# Patient Record
Sex: Male | Born: 1975
Health system: Southern US, Community
[De-identification: ages and names within clinical notes are randomized; demographics above are authoritative.]

## PROBLEM LIST (undated history)

## (undated) DIAGNOSIS — Z9289 Personal history of other medical treatment: Secondary | ICD-10-CM

## (undated) DIAGNOSIS — F1911 Other psychoactive substance abuse, in remission: Secondary | ICD-10-CM

## (undated) DIAGNOSIS — B20 Human immunodeficiency virus [HIV] disease: Secondary | ICD-10-CM

## (undated) DIAGNOSIS — I7300 Raynaud's syndrome without gangrene: Secondary | ICD-10-CM

## (undated) DIAGNOSIS — R569 Unspecified convulsions: Secondary | ICD-10-CM

## (undated) DIAGNOSIS — K900 Celiac disease: Secondary | ICD-10-CM

## (undated) DIAGNOSIS — Z21 Asymptomatic human immunodeficiency virus [HIV] infection status: Secondary | ICD-10-CM

## (undated) DIAGNOSIS — T7840XA Allergy, unspecified, initial encounter: Secondary | ICD-10-CM

## (undated) DIAGNOSIS — F419 Anxiety disorder, unspecified: Secondary | ICD-10-CM

## (undated) HISTORY — DX: Asymptomatic human immunodeficiency virus (hiv) infection status: Z21

## (undated) HISTORY — DX: Celiac disease: K90.0

## (undated) HISTORY — DX: Human immunodeficiency virus (HIV) disease: B20

## (undated) HISTORY — DX: Unspecified convulsions: R56.9

## (undated) HISTORY — DX: Other psychoactive substance abuse, in remission: F19.11

## (undated) HISTORY — DX: Personal history of other medical treatment: Z92.89

## (undated) HISTORY — DX: Anxiety disorder, unspecified: F41.9

## (undated) HISTORY — DX: Raynaud's syndrome without gangrene: I73.00

## (undated) HISTORY — PX: WISDOM TOOTH EXTRACTION: SHX21

## (undated) HISTORY — DX: Allergy, unspecified, initial encounter: T78.40XA

---

## 2013-12-25 DIAGNOSIS — Z8719 Personal history of other diseases of the digestive system: Secondary | ICD-10-CM | POA: Insufficient documentation

## 2013-12-25 DIAGNOSIS — Z87898 Personal history of other specified conditions: Secondary | ICD-10-CM | POA: Insufficient documentation

## 2013-12-25 DIAGNOSIS — R768 Other specified abnormal immunological findings in serum: Secondary | ICD-10-CM | POA: Insufficient documentation

## 2015-02-13 ENCOUNTER — Telehealth: Payer: Self-pay

## 2015-02-13 NOTE — Telephone Encounter (Signed)
Patient was previously in care for his HIV at [facility].  Dr Jeanie Cooks is his primary care physician.   He was diagnosed 2005 and currently on Triumeq.  When patient arrives for visit he will need to sign a release for [facility].   Laverle Patter, RN

## 2015-02-26 ENCOUNTER — Encounter: Payer: Self-pay | Admitting: Internal Medicine

## 2015-02-26 ENCOUNTER — Ambulatory Visit (INDEPENDENT_AMBULATORY_CARE_PROVIDER_SITE_OTHER): Payer: 59 | Admitting: Internal Medicine

## 2015-02-26 VITALS — BP 149/89 | HR 59 | Temp 97.9°F | Ht 74.0 in | Wt 153.8 lb

## 2015-02-26 DIAGNOSIS — Z23 Encounter for immunization: Secondary | ICD-10-CM | POA: Diagnosis not present

## 2015-02-26 DIAGNOSIS — Z113 Encounter for screening for infections with a predominantly sexual mode of transmission: Secondary | ICD-10-CM | POA: Diagnosis not present

## 2015-02-26 DIAGNOSIS — B20 Human immunodeficiency virus [HIV] disease: Secondary | ICD-10-CM | POA: Diagnosis not present

## 2015-02-26 DIAGNOSIS — Z79899 Other long term (current) drug therapy: Secondary | ICD-10-CM | POA: Insufficient documentation

## 2015-02-26 DIAGNOSIS — G40909 Epilepsy, unspecified, not intractable, without status epilepticus: Secondary | ICD-10-CM | POA: Insufficient documentation

## 2015-02-26 DIAGNOSIS — K900 Celiac disease: Secondary | ICD-10-CM | POA: Insufficient documentation

## 2015-02-26 MED ORDER — TRIUMEQ 600-50-300 MG PO TABS: 1.0000 | ORAL_TABLET | Freq: Every day | ORAL | Status: DC

## 2015-02-26 NOTE — Progress Notes (Deleted)
Patient ID: John Robbins, male    DOB: 09/05/1975, 39 y.o.   MRN: [id number]  Reason for visit: to establish care as a new patient with HIV  HPI:   Patient was first diagnosed in 2003 with a CD4 of over 500.  He was tested as part screening test.  The CD4 count was 574 at [facility] in September 2015, viral load undetectable.  There have been no associated symptoms.  He was initially started on Isentress and Truvada then changed to Triumeq when he started at [facility] May 2015.    No past medical history on file.  Prior to Admission medications   Not on File    Allergies  Allergen Reactions  . Gluten Meal Other (See Comments)    Neurological    Social History  Substance Use Topics  . Smoking status: Never Smoker   . Smokeless tobacco: Never Used  . Alcohol Use: 0.0 oz/week    0 Standard drinks or equivalent per week    No family history on file.  ***  Review of Systems {ros - complete:22885} All other systems reviewed and are negative   CONSTITUTIONAL:{EXAM; GENERAL APPEARANCE:5021}  Filed Vitals:   02/26/15 1359  BP: 149/89  Pulse: 59  Temp: 97.9 F (36.6 C)   EYES: anicteric HENT: no thrush CARD:{Mis exam cardio:32073} RESP:{Exam; lungs brief:12271} GI:{Exam; abdomen:5794::"Bowel sounds are normal","liver is not enlarged","spleen is not enlarged"} MS:{extremities:315109::"peripheral pulses normal, no pedal edema, no clubbing or cyanosis"} SKIN:*** NEURO: ***  No results found for: HIV1RNAQUANT No components found for: HIV1GENOTYPRPLUS No components found for: THELPERCELL  Assessment: new patient here with HIV.  Discussed with patient treatment options and side effects, benefits of treatment, long term outcomes.  I discussed the severity of untreated HIV including higher cancer risk, opportunistic infections, renal failure.  Also discussed needing to use condoms, partner disclosure, necessary vaccines, blood monitoring.  All questions answered.    Plan: 1)

## 2015-02-26 NOTE — Progress Notes (Signed)
Patient ID: John Robbins, male   DOB: 02-Jan-1976, 39 y.o.   MRN: [id number]  Patient ID: John Robbins, male    DOB: [birth year], 39 y.o.   MRN: [id number]  Reason for visit: to establish care as a new patient with HIV  HPI:   Patient was first diagnosedin about 2003 during episode of pancreatitis thought to be due to [place].  He was tested as part hospitalization.  Previously tested never before that.  The CD4 count is 618 from [facility], viral load undetectable.  There have been no associated symptoms.  He was previously on Isentress and Truvada starting in 2013 for his first regimen and then switched to Triumeq in 2015 at [facility].  He takes daily, no missed doses, uninterrupted.  Does note some burning when taking but not too bad.  No weight loss.  Take Valtrex as needed for pain/sensitivity on side that felt like shingles.  No issues or questions.   had anal swab at [facility] c/w ASCUS.   Past Medical History  Diagnosis Date  . HIV infection ([place])   . Seizures ([place])   . Celiac disease     Prior to Admission medications   Medication Sig Start Date End Date Taking? Authorizing Provider  TRIUMEQ 600-50-300 MG TABS Take 1 tablet by mouth daily. 02/26/15  Yes Thayer Headings, MD  valACYclovir (VALTREX) 500 MG tablet Take 500 mg by mouth 2 (two) times daily.   Yes Historical Provider, MD    Allergies  Allergen Reactions  . Gluten Meal Other (See Comments)    Neurological    Social History  Substance Use Topics  . Smoking status: Never Smoker   . Smokeless tobacco: Never Used  . Alcohol Use: 0.0 oz/week    0 Standard drinks or equivalent per week    Family History  Problem Relation Age of Onset  . Heart disease Father   . Hypertension Father   . Heart disease Maternal Grandfather     Review of Systems Constitutional: negative for fevers, fatigue and malaise Respiratory: negative for cough Gastrointestinal: negative for diarrhea All other systems reviewed and are negative    CONSTITUTIONAL:in no apparent distress and alert  Filed Vitals:   02/26/15 1359  BP: 149/89  Pulse: 59  Temp: 97.9 F (36.6 C)   EYES: anicteric HENT: no thrush CARD:Cor RRR and No murmurs RESP:clear to auscultation, normal respiratory effort. BI:PJRPZ sounds are normal, spleen is not enlarged MS:no joint swelling SKIN:no rashes NEURO: non-focal GU: no anogenital lesions noted  Labs from PCP office, dated 01/06/2015: CD 4 618, viral load undetectable, WBC 9, Hgb 15.9, creat 0.85  Assessment: new patient here with well-controlled HIV.  Discussed with patient other treatment options and side effects, benefits of treatment, long term outcomes.  I discussed the severity of untreated HIV including higher cancer risk, opportunistic infections, renal failure.  Also discussed needing to use condoms, partner disclosure, necessary vaccines, blood monitoring.  All questions answered.    Plan: 1) continue with Triumeq, refills sent 2) schedule with [facility] clinic with history of ASCUS  3) rtc 3 months with labs before 4) My Chart info given

## 2015-04-01 ENCOUNTER — Encounter: Payer: Self-pay | Admitting: *Deleted

## 2015-05-19 ENCOUNTER — Other Ambulatory Visit: Payer: 59

## 2015-05-19 DIAGNOSIS — B20 Human immunodeficiency virus [HIV] disease: Secondary | ICD-10-CM

## 2015-05-19 DIAGNOSIS — Z113 Encounter for screening for infections with a predominantly sexual mode of transmission: Secondary | ICD-10-CM

## 2015-05-19 NOTE — Addendum Note (Signed)
Addended by: Dolan Amen D on: 05/19/2015 11:46 AM   Modules accepted: Orders

## 2015-05-20 LAB — T-HELPER CELL (CD4) - (RCID CLINIC ONLY)
CD4 T CELL HELPER: 24 % — AB (ref 33–55)
CD4 T Cell Abs: 460 /uL (ref 400–2700)

## 2015-05-20 LAB — URINE CYTOLOGY ANCILLARY ONLY
CHLAMYDIA, DNA PROBE: NEGATIVE
NEISSERIA GONORRHEA: NEGATIVE

## 2015-05-20 LAB — RPR

## 2015-05-20 LAB — HIV-1 RNA QUANT-NO REFLEX-BLD
HIV 1 RNA QUANT: 135 {copies}/mL — AB (ref <20)
HIV-1 RNA Quant, Log: 2.13 Log copies/mL — ABNORMAL HIGH (ref <1.30)

## 2015-06-02 ENCOUNTER — Encounter: Payer: Self-pay | Admitting: Internal Medicine

## 2015-06-02 ENCOUNTER — Ambulatory Visit (INDEPENDENT_AMBULATORY_CARE_PROVIDER_SITE_OTHER): Payer: 59 | Admitting: Internal Medicine

## 2015-06-02 VITALS — BP 144/97 | HR 80 | Temp 98.2°F | Ht 74.0 in | Wt 163.0 lb

## 2015-06-02 DIAGNOSIS — Z23 Encounter for immunization: Secondary | ICD-10-CM

## 2015-06-02 DIAGNOSIS — Z79899 Other long term (current) drug therapy: Secondary | ICD-10-CM | POA: Diagnosis not present

## 2015-06-02 DIAGNOSIS — B20 Human immunodeficiency virus [HIV] disease: Secondary | ICD-10-CM | POA: Diagnosis not present

## 2015-06-02 DIAGNOSIS — K900 Celiac disease: Secondary | ICD-10-CM

## 2015-06-02 DIAGNOSIS — Z113 Encounter for screening for infections with a predominantly sexual mode of transmission: Secondary | ICD-10-CM | POA: Diagnosis not present

## 2015-06-02 NOTE — Assessment & Plan Note (Signed)
Some episodes of intolerance.  Encouraged to try and take medicine regardless.

## 2015-06-02 NOTE — Assessment & Plan Note (Signed)
I will check his viral load again to be sure is still suppressed or nearly so.  If ok, rtc 3-4 months.

## 2015-06-02 NOTE — Progress Notes (Signed)
CC: Follow up for HIV  Interval history: Currently is asymptomatic and nearly suppressed on Triumeq.  Since last visit he has had no new issues.   Has no associated side effects.  He thinks he may have missed some doses with intestinal upset from celiac disease.  He is unsure if it was before his labs.  His CD4 is 460 but viral load is 135.       A  Prior to Admission medications   Medication Sig Start Date End Date Taking? Authorizing Provider  TRIUMEQ 600-50-300 MG TABS Take 1 tablet by mouth daily. 02/26/15  Yes Thayer Headings, MD  valACYclovir (VALTREX) 500 MG tablet Take 500 mg by mouth 2 (two) times daily.   Yes Historical Provider, MD    Review of Systems Constitutional: negative for fatigue Gastrointestinal: negative for diarrhea and constipation All other systems reviewed and are negative   Physical Exam: CONSTITUTIONAL:in no apparent distress  Filed Vitals:   06/02/15 0946  BP: 144/97  Pulse: 80  Temp: 98.2 F (36.8 C)   Eyes: anicteric HENT: no thrush, no cervical lymphadenopathy Respiratory: Normal respiratory effort; CTA B Skin: no rashes  Lab Results  Component Value Date   HIV1RNAQUANT 135* 05/19/2015   No components found for: HIV1GENOTYPRPLUS No components found for: THELPERCELL

## 2015-06-02 NOTE — Addendum Note (Signed)
Addended by: Landis Gandy on: 06/02/2015 11:04 AM   Modules accepted: Orders

## 2015-06-03 LAB — HIV-1 RNA QUANT-NO REFLEX-BLD

## 2015-06-04 ENCOUNTER — Encounter: Payer: Self-pay | Admitting: Internal Medicine

## 2015-06-04 ENCOUNTER — Encounter: Payer: Self-pay | Admitting: *Deleted

## 2015-06-04 NOTE — Telephone Encounter (Signed)
[name], could you help him? Thanks!  Sharyn Lull

## 2015-06-05 NOTE — Telephone Encounter (Signed)
Pt. Message:  John Robbins,  If MyChart allows you access to media files, there are three scanned documents dated 01/06/15 in your chart from your primary care physician. If this is not viewable in [place], you are welcome to come to the clinic and request a records release and we will print these three files.  [name]

## 2015-06-26 ENCOUNTER — Encounter: Payer: Self-pay | Admitting: Internal Medicine

## 2015-08-07 ENCOUNTER — Encounter: Payer: Self-pay | Admitting: Internal Medicine

## 2015-08-07 NOTE — Telephone Encounter (Signed)
patietn with questions regarding billing.  Will route to [facility]. Landis Gandy, RN

## 2015-08-11 ENCOUNTER — Encounter: Payer: Self-pay | Admitting: Internal Medicine

## 2015-08-13 ENCOUNTER — Telehealth: Payer: Self-pay | Admitting: *Deleted

## 2015-08-13 ENCOUNTER — Encounter: Payer: Self-pay | Admitting: *Deleted

## 2015-08-13 ENCOUNTER — Other Ambulatory Visit: Payer: Self-pay | Admitting: Internal Medicine

## 2015-08-13 DIAGNOSIS — B20 Human immunodeficiency virus [HIV] disease: Secondary | ICD-10-CM

## 2015-08-13 NOTE — Telephone Encounter (Signed)
Pt having a billing issue, needs to speak with Samara Snide.  Transferred pt to Samara Snide to address billing needs.

## 2015-08-17 ENCOUNTER — Other Ambulatory Visit: Payer: Self-pay | Admitting: Internal Medicine

## 2015-08-26 ENCOUNTER — Other Ambulatory Visit: Payer: Self-pay | Admitting: *Deleted

## 2015-08-26 DIAGNOSIS — B20 Human immunodeficiency virus [HIV] disease: Secondary | ICD-10-CM

## 2015-08-26 MED ORDER — ABACAVIR-DOLUTEGRAVIR-LAMIVUD 600-50-300 MG PO TABS: 1.0000 | ORAL_TABLET | Freq: Every day | ORAL | Status: DC

## 2015-09-22 ENCOUNTER — Other Ambulatory Visit: Payer: 59

## 2015-10-05 ENCOUNTER — Ambulatory Visit: Payer: 59 | Admitting: Internal Medicine

## 2015-11-30 ENCOUNTER — Other Ambulatory Visit: Payer: 59

## 2015-11-30 ENCOUNTER — Other Ambulatory Visit (HOSPITAL_COMMUNITY)
Admission: RE | Admit: 2015-11-30 | Discharge: 2015-11-30 | Disposition: A | Discharge status: Home / Self Care | Payer: 59 | Source: Ambulatory Visit | Attending: Internal Medicine | Admitting: Internal Medicine

## 2015-11-30 DIAGNOSIS — Z113 Encounter for screening for infections with a predominantly sexual mode of transmission: Secondary | ICD-10-CM | POA: Diagnosis not present

## 2015-11-30 DIAGNOSIS — Z79899 Other long term (current) drug therapy: Secondary | ICD-10-CM

## 2015-11-30 DIAGNOSIS — B20 Human immunodeficiency virus [HIV] disease: Secondary | ICD-10-CM

## 2015-11-30 LAB — CBC WITH DIFFERENTIAL/PLATELET
BASOS ABS: 0 {cells}/uL (ref 0–200)
Basophils Relative: 0 %
EOS PCT: 2 %
Eosinophils Absolute: 96 cells/uL (ref 15–500)
HEMATOCRIT: 48.2 % (ref 38.5–50.0)
HEMOGLOBIN: 16.5 g/dL (ref 13.2–17.1)
LYMPHS ABS: 2112 {cells}/uL (ref 850–3900)
Lymphocytes Relative: 44 %
MCH: 31.1 pg (ref 27.0–33.0)
MCHC: 34.2 g/dL (ref 32.0–36.0)
MCV: 90.8 fL (ref 80.0–100.0)
MONO ABS: 336 {cells}/uL (ref 200–950)
MPV: 11.6 fL (ref 7.5–12.5)
Monocytes Relative: 7 %
NEUTROS ABS: 2256 {cells}/uL (ref 1500–7800)
Neutrophils Relative %: 47 %
Platelets: 190 10*3/uL (ref 140–400)
RBC: 5.31 MIL/uL (ref 4.20–5.80)
RDW: 13.5 % (ref 11.0–15.0)
WBC: 4.8 10*3/uL (ref 3.8–10.8)

## 2015-11-30 LAB — COMPLETE METABOLIC PANEL WITH GFR
ALBUMIN: 4.3 g/dL (ref 3.6–5.1)
ALK PHOS: 43 U/L (ref 40–115)
ALT: 23 U/L (ref 9–46)
AST: 21 U/L (ref 10–40)
BUN: 17 mg/dL (ref 7–25)
CALCIUM: 9.1 mg/dL (ref 8.6–10.3)
CHLORIDE: 102 mmol/L (ref 98–110)
CO2: 29 mmol/L (ref 20–31)
Creat: 1.27 mg/dL (ref 0.60–1.35)
GFR, Est African American: 82 mL/min (ref >=60)
GFR, Est Non African American: 71 mL/min (ref >=60)
GLUCOSE: 119 mg/dL — AB (ref 65–99)
Potassium: 3.9 mmol/L (ref 3.5–5.3)
SODIUM: 139 mmol/L (ref 135–146)
TOTAL PROTEIN: 6.4 g/dL (ref 6.1–8.1)
Total Bilirubin: 0.8 mg/dL (ref 0.2–1.2)

## 2015-11-30 LAB — LIPID PANEL
CHOL/HDL RATIO: 3.2 ratio (ref <=5.0)
CHOLESTEROL: 183 mg/dL (ref 125–200)
HDL: 57 mg/dL (ref >=40)
LDL Cholesterol: 96 mg/dL (ref <130)
Triglycerides: 151 mg/dL — ABNORMAL HIGH (ref <150)
VLDL: 30 mg/dL (ref <30)

## 2015-12-01 LAB — T-HELPER CELL (CD4) - (RCID CLINIC ONLY)
CD4 % Helper T Cell: 25 % — ABNORMAL LOW (ref 33–55)
CD4 T Cell Abs: 540 /uL (ref 400–2700)

## 2015-12-01 LAB — RPR

## 2015-12-01 LAB — HIV-1 RNA QUANT-NO REFLEX-BLD
HIV 1 RNA Quant: <20 copies/mL (ref <20)
HIV-1 RNA Quant, Log: <1.3 Log copies/mL (ref <1.30)

## 2015-12-01 LAB — URINE CYTOLOGY ANCILLARY ONLY
Chlamydia: NEGATIVE
Neisseria Gonorrhea: NEGATIVE

## 2016-06-03 ENCOUNTER — Ambulatory Visit (HOSPITAL_COMMUNITY)
Admission: EM | Admit: 2016-06-03 | Discharge: 2016-06-03 | Disposition: A | Discharge status: Home / Self Care | Payer: 59 | Attending: Family Medicine | Admitting: Family Medicine

## 2016-06-03 ENCOUNTER — Encounter (HOSPITAL_COMMUNITY): Payer: Self-pay | Admitting: Emergency Medicine

## 2016-06-03 DIAGNOSIS — J40 Bronchitis, not specified as acute or chronic: Secondary | ICD-10-CM | POA: Diagnosis not present

## 2016-06-03 MED ORDER — PREDNISONE 20 MG PO TABS: 20.0000 mg | ORAL_TABLET | Freq: Two times a day (BID) | ORAL | 0 refills | Status: AC

## 2016-06-03 MED ORDER — ALBUTEROL SULFATE HFA 108 (90 BASE) MCG/ACT IN AERS: 1.0000 | INHALATION_SPRAY | Freq: Four times a day (QID) | RESPIRATORY_TRACT | 0 refills | Status: DC | PRN

## 2016-06-03 MED ORDER — AZITHROMYCIN 250 MG PO TABS: 250.0000 mg | ORAL_TABLET | Freq: Every day | ORAL | 0 refills | Status: DC

## 2016-06-03 MED ORDER — BENZONATATE 100 MG PO CAPS: 100.0000 mg | ORAL_CAPSULE | Freq: Three times a day (TID) | ORAL | 0 refills | Status: DC

## 2016-06-03 NOTE — Discharge Instructions (Signed)
I am treating you for bronchitis. I have prescribed Azithromycin. Take 2 tablets today, then 1 tablet daily till finished. I have also prescribed a steroid called prednisone. Take two tablets, twice a day for 6 days. For cough, I have prescribed a medication called Tessalon. Take 1 tablet every 8 hours as needed for your cough. I have also prescribed her rescue inhaler called albuterol. Take 1-2 puffs every 4-6 hours as needed for wheezing and shortness of breath. Should your symptoms fail to improve or worsen, follow up with your primary care provider, or return to clinic.

## 2016-06-03 NOTE — ED Provider Notes (Signed)
CSN: [id number]     Arrival date & time 06/03/16  1206 History   First MD Initiated Contact with Patient 06/03/16 1230     Chief Complaint  Patient presents with  . Cough   (Consider location/radiation/quality/duration/timing/severity/associated sxs/prior Treatment) 41 year old male presents to clinic with a 3 to four-week history of cough, weakness, shortness of breath, and some wheezing. States his symptoms initially started as being flulike, with fever, weakness, loss of appetite, and congestion. States those symptoms have since resolved, however his cough, and wheezing, has remained. He does not smoke, has had a prior history of exercise-induced asthma, denies COPD or emphysema. He has no nausea, vomiting, diarrhea, or abdominal pain. Denies other symptoms.   The history is provided by the patient.  Cough    Past Medical History:  Diagnosis Date  . Asthma   . Celiac disease   . HIV infection ([place])   . Seizures ([place])    History reviewed. No pertinent surgical history. Family History  Problem Relation Age of Onset  . Heart disease Father   . Hypertension Father   . Heart disease Maternal Grandfather    Social History  Substance Use Topics  . Smoking status: Never Smoker  . Smokeless tobacco: Never Used  . Alcohol use 0.0 oz/week    Review of Systems  Reason unable to perform ROS: As covered in history of present illness.  Respiratory: Positive for cough.   All other systems reviewed and are negative.   Allergies  Gluten meal  Home Medications   Prior to Admission medications   Medication Sig Start Date End Date Taking? Authorizing Provider  abacavir-dolutegravir-lamiVUDine (TRIUMEQ) 600-50-300 MG tablet Take 1 tablet by mouth daily. 08/26/15  Yes Thayer Headings, MD  albuterol (PROVENTIL HFA;VENTOLIN HFA) 108 (90 Base) MCG/ACT inhaler Inhale 1-2 puffs into the lungs every 6 (six) hours as needed for wheezing or shortness of breath. 06/03/16   Barnet Glasgow, NP   azithromycin (ZITHROMAX) 250 MG tablet Take 1 tablet (250 mg total) by mouth daily. Take first 2 tablets together, then 1 every day until finished. 06/03/16   Barnet Glasgow, NP  benzonatate (TESSALON) 100 MG capsule Take 1 capsule (100 mg total) by mouth every 8 (eight) hours. 06/03/16   Barnet Glasgow, NP  predniSONE (DELTASONE) 20 MG tablet Take 1 tablet (20 mg total) by mouth 2 (two) times daily with a meal. 06/03/16 06/09/16  Barnet Glasgow, NP  valACYclovir (VALTREX) 500 MG tablet Take 500 mg by mouth 2 (two) times daily.    Historical Provider, MD   Meds Ordered and Administered this Visit  Medications - No data to display  BP 134/96 (BP Location: Left Arm)   Pulse 70   Temp 97.5 F (36.4 C) (Oral)   SpO2 100%  No data found.   Physical Exam  Constitutional: He is oriented to person, place, and time. He appears well-developed and well-nourished. He does not have a sickly appearance. He does not appear ill. No distress.  HENT:  Head: Normocephalic and atraumatic.  Right Ear: Tympanic membrane and external ear normal.  Left Ear: Tympanic membrane and external ear normal.  Nose: Nose normal. Right sinus exhibits no maxillary sinus tenderness and no frontal sinus tenderness. Left sinus exhibits no maxillary sinus tenderness and no frontal sinus tenderness.  Mouth/Throat: Uvula is midline and oropharynx is clear and moist. No oropharyngeal exudate.  Eyes: Pupils are equal, round, and reactive to light.  Neck: Normal range of motion. Neck supple. No JVD present.  Cardiovascular: Normal rate and regular rhythm.   Pulmonary/Chest: Effort normal and breath sounds normal. No respiratory distress. He has no wheezes. He has no rhonchi.  Abdominal: Soft. Bowel sounds are normal. There is no tenderness.  Lymphadenopathy:       Head (right side): No submental, no submandibular, no tonsillar and no preauricular adenopathy present.       Head (left side): No submental, no submandibular, no  tonsillar and no preauricular adenopathy present.    He has no cervical adenopathy.  Neurological: He is alert and oriented to person, place, and time.  Skin: Skin is warm and dry. Capillary refill takes less than 2 seconds. No rash noted. He is not diaphoretic. No erythema.  Psychiatric: He has a normal mood and affect.  Nursing note and vitals reviewed.   Urgent Care Course     Procedures (including critical care time)  Labs Review Labs Reviewed - No data to display  Imaging Review No results found.   Visual Acuity Review  Right Eye Distance:   Left Eye Distance:   Bilateral Distance:    Right Eye Near:   Left Eye Near:    Bilateral Near:         MDM   1. Bronchitis    I am treating you for bronchitis. I have prescribed Azithromycin. Take 2 tablets today, then 1 tablet daily till finished. I have also prescribed a steroid called prednisone. Take two tablets, twice a day for 6 days. For cough, I have prescribed a medication called Tessalon. Take 1 tablet every 8 hours as needed for your cough. I have also prescribed her rescue inhaler called albuterol. Take 1-2 puffs every 4-6 hours as needed for wheezing and shortness of breath. Should your symptoms fail to improve or worsen, follow up with your primary care provider, or return to clinic.       Barnet Glasgow, NP 06/03/16 [phone]

## 2016-06-03 NOTE — ED Triage Notes (Signed)
Pt has had a cough for one week.  He has also had intermittent SOB with the cough.  Pt also reports some mild sinus congestion and mild stomach discomfort.

## 2016-06-23 ENCOUNTER — Encounter: Payer: Self-pay | Admitting: Internal Medicine

## 2016-06-29 ENCOUNTER — Ambulatory Visit (HOSPITAL_COMMUNITY)
Admission: EM | Admit: 2016-06-29 | Discharge: 2016-06-29 | Disposition: A | Discharge status: Home / Self Care | Payer: 59 | Attending: Emergency Medicine | Admitting: Emergency Medicine

## 2016-06-29 ENCOUNTER — Encounter (HOSPITAL_COMMUNITY): Payer: Self-pay | Admitting: Emergency Medicine

## 2016-06-29 DIAGNOSIS — K219 Gastro-esophageal reflux disease without esophagitis: Secondary | ICD-10-CM

## 2016-06-29 DIAGNOSIS — R05 Cough: Secondary | ICD-10-CM

## 2016-06-29 DIAGNOSIS — J385 Laryngeal spasm: Secondary | ICD-10-CM

## 2016-06-29 DIAGNOSIS — R059 Cough, unspecified: Secondary | ICD-10-CM

## 2016-06-29 MED ORDER — GUAIFENESIN-CODEINE 100-10 MG/5ML PO SOLN: 5.0000 mL | Freq: Three times a day (TID) | ORAL | 0 refills | Status: DC | PRN

## 2016-06-29 MED ORDER — OMEPRAZOLE 40 MG PO CPDR: 40.0000 mg | DELAYED_RELEASE_CAPSULE | Freq: Every day | ORAL | 0 refills | Status: DC

## 2016-06-29 MED ORDER — LORAZEPAM 2 MG/ML IJ SOLN
INTRAMUSCULAR | Status: AC
  Filled 2016-06-29: qty 1

## 2016-06-29 MED ORDER — LORAZEPAM 2 MG/ML IJ SOLN
1.0000 mg | Freq: Once | INTRAMUSCULAR | Status: AC
  Administered 2016-06-29: 1 mg via INTRAMUSCULAR

## 2016-06-29 NOTE — ED Provider Notes (Signed)
CSN: [id number]     Arrival date & time 06/29/16  1113 History   None    Chief Complaint  Patient presents with  . Shortness of Breath   (Consider location/radiation/quality/duration/timing/severity/associated sxs/prior Treatment) 41 year old male presents to the urgent care chief complaint of coughing, shortness of breath, and sensation of his "throat closing"states that he was in his office earlier, and that he handed aerosolized some "essential oils" and immediately felt a spasm and difficulty breathing. He has used his inhaler 4 times, without relief.   The history is provided by the patient.  Shortness of Breath  Severity:  Severe Onset quality:  Sudden Duration:  2 hours Timing:  Constant Progression:  Unchanged Chronicity:  New Context: fumes and URI   Context: not smoke exposure   Relieved by:  Nothing Worsened by:  Deep breathing, emotional stress and exertion Ineffective treatments:  Inhaler Associated symptoms: cough and wheezing   Associated symptoms: no abdominal pain, no chest pain, no diaphoresis, no fever, no headaches, no hemoptysis, no neck pain, no rash, no sore throat, no swollen glands and no vomiting     Past Medical History:  Diagnosis Date  . Asthma   . Celiac disease   . HIV infection ([place])   . Seizures ([place])    History reviewed. No pertinent surgical history. Family History  Problem Relation Age of Onset  . Heart disease Father   . Hypertension Father   . Heart disease Maternal Grandfather    Social History  Substance Use Topics  . Smoking status: Never Smoker  . Smokeless tobacco: Never Used  . Alcohol use 0.0 oz/week    Review of Systems  Constitutional: Negative for diaphoresis and fever.  HENT: Negative for sore throat and voice change.   Eyes: Negative for photophobia, pain, discharge and redness.  Respiratory: Positive for cough, shortness of breath and wheezing. Negative for hemoptysis.   Cardiovascular: Negative for chest pain.   Gastrointestinal: Negative for abdominal pain, diarrhea, nausea and vomiting.  Musculoskeletal: Negative for neck pain and neck stiffness.  Skin: Negative for color change and rash.  Neurological: Positive for weakness and light-headedness. Negative for dizziness, syncope and headaches.    Allergies  Gluten meal  Home Medications   Prior to Admission medications   Medication Sig Start Date End Date Taking? Authorizing Provider  albuterol (PROVENTIL HFA;VENTOLIN HFA) 108 (90 Base) MCG/ACT inhaler Inhale 1-2 puffs into the lungs every 6 (six) hours as needed for wheezing or shortness of breath. 06/03/16  Yes Barnet Glasgow, NP  benzonatate (TESSALON) 100 MG capsule Take 1 capsule (100 mg total) by mouth every 8 (eight) hours. 06/03/16  Yes Barnet Glasgow, NP  abacavir-dolutegravir-lamiVUDine (TRIUMEQ) 600-50-300 MG tablet Take 1 tablet by mouth daily. 08/26/15   Thayer Headings, MD  guaiFENesin-codeine 100-10 MG/5ML syrup Take 5 mLs by mouth 3 (three) times daily as needed for cough. 06/29/16   Barnet Glasgow, NP  omeprazole (PRILOSEC) 40 MG capsule Take 1 capsule (40 mg total) by mouth daily. 06/29/16   Barnet Glasgow, NP  valACYclovir (VALTREX) 500 MG tablet Take 500 mg by mouth 2 (two) times daily.    Historical Provider, MD   Meds Ordered and Administered this Visit   Medications  LORazepam (ATIVAN) injection 1 mg (1 mg Intramuscular Given 06/29/16 1155)    BP (!) 148/106 (BP Location: Right Arm)   Pulse 95   Resp 18   SpO2 99%  No data found.   Physical Exam  Constitutional: He is oriented  to person, place, and time. He appears well-developed and well-nourished. No distress.  HENT:  Head: Normocephalic and atraumatic.  Right Ear: External ear normal.  Left Ear: External ear normal.  Nose: Nose normal.  Mouth/Throat: Uvula is midline, oropharynx is clear and moist and mucous membranes are normal. Tonsils are 0 on the right. Tonsils are 0 on the left. No tonsillar exudate.   Eyes: EOM are normal. Pupils are equal, round, and reactive to light.  Neck: Normal range of motion. Neck supple. No JVD present.  Cardiovascular: Normal rate, regular rhythm, normal heart sounds and intact distal pulses.   Pulmonary/Chest: Effort normal and breath sounds normal. No respiratory distress. He has no wheezes. He has no rales.  Abdominal: Soft. Bowel sounds are normal. He exhibits no distension. There is no tenderness.  Musculoskeletal: He exhibits no edema or tenderness.  Lymphadenopathy:       Head (right side): No submental, no submandibular, no tonsillar and no preauricular adenopathy present.       Head (left side): No submental, no submandibular, no tonsillar and no preauricular adenopathy present.  Neurological: He is alert and oriented to person, place, and time.  Skin: Skin is warm and dry. Capillary refill takes less than 2 seconds. No rash noted. He is not diaphoretic. No pallor.  Psychiatric: He has a normal mood and affect. His behavior is normal.  Nursing note and vitals reviewed.   Urgent Care Course     Procedures (including critical care time)  Labs Review Labs Reviewed - No data to display  Imaging Review No results found.     MDM   1. Cough   2. Laryngospasm   3. Gastroesophageal reflux disease, esophagitis presence not specified    Treated for acute laryngospasm. Provided comfort and supportive care, significant time spent at bedside along with with RN staff providing support. Pt given injection of Ativan for anxiety. Started on Prilosec for possible reflux like symptoms, given Cheratussin for cough, and encouraged to follow up with primary care doctor for further evaluation. Should symptoms return, or at any time if symptoms worsen, advised go to the emergency room for further evaluation.     Barnet Glasgow, NP 06/29/16 1346

## 2016-06-29 NOTE — Discharge Instructions (Signed)
I am treating you today for your acute cough, and for possible reflux based on your symptoms. Described omeprazole, take one tablet daily for 14 days.I have also prescribed a medicine for cough called codeine, this medicine is a narcotic, it will cause drowsiness, and it is addictive. Do not take more than what is necessary, do not drink alcohol while taking, and do not operate any heavy machinery while taking this medicine. I also recommend he take an over-the-counter antihistamine such as Claritin or Zyrtec every day. I also recommend you follow-up with your doctor, and keep your appointment. If your symptoms persist, follow-up with primary care or return to clinic.

## 2016-06-29 NOTE — ED Notes (Signed)
Patient's breath sounds clear throughout, some squeaky upper airway sounds intermittently.  Patient coughing frequently initially.  Attempted to calm patient, episodes of slower breathing, complaints of hands tingling, and "body spasms".  Patient's body appears to be cramping.  Hands clammy, warm.  [place], np at bedside

## 2016-06-29 NOTE — ED Triage Notes (Signed)
Patient was utilizing essential oils and diffuser this am when overcome with fumes, started coughing, went outside to get fresh air.

## 2016-06-30 ENCOUNTER — Encounter: Payer: Self-pay | Admitting: Internal Medicine

## 2016-06-30 ENCOUNTER — Ambulatory Visit (INDEPENDENT_AMBULATORY_CARE_PROVIDER_SITE_OTHER): Payer: 59 | Admitting: Internal Medicine

## 2016-06-30 VITALS — BP 133/86 | HR 87 | Temp 97.7°F | Ht 74.0 in | Wt 139.0 lb

## 2016-06-30 DIAGNOSIS — B20 Human immunodeficiency virus [HIV] disease: Secondary | ICD-10-CM | POA: Diagnosis not present

## 2016-06-30 DIAGNOSIS — F419 Anxiety disorder, unspecified: Secondary | ICD-10-CM

## 2016-06-30 DIAGNOSIS — K5900 Constipation, unspecified: Secondary | ICD-10-CM | POA: Diagnosis not present

## 2016-06-30 NOTE — Progress Notes (Signed)
CC: Follow up for HIV  Interval history: John Robbins comes in with multiple complaints.  He has not been seen in more than 1 year and continues on Triumeq.  He did get labs about 7 months ago and was doing well with a suppressed virus and CD4 of 540 but had not otherwise been in.  His first complaint is some feelings of dysphagia and spasm that has been ongoing for many years but seems progressively worse.  He had a work up in [place] with an EGD and colonoscopy and no issues found.  He has been constipated and manually manipulates himself to help get the stool out.  No bloating, no weight loss.  He does have trouble gaining weight though eats well including high protein supplements.  He also get a feeling in his gut that is like dissolving Alka Seltzer tablets.     He also has had a cough for several months.  It is dry, non-productive.  He was in urgent care recently with it and seemed to be very anxious as a result and did improve after an injection of Ativan.  He states he can barely remember all that went on.     He does endorse a lot of anxiety at baseline and understands a lot of issues are related to this.  He is clearly animated in the discussion with worry and anxiety.    He is trying to get a pcp but can't find one that takes his insurance. He has missed some of his Triumeq recently thinking that may have been part of the problem. Has been back on it now.   Prior to Admission medications   Medication Sig Start Date End Date Taking? Authorizing Provider  abacavir-dolutegravir-lamiVUDine (TRIUMEQ) 600-50-300 MG tablet Take 1 tablet by mouth daily. 08/26/15  Yes Thayer Headings, MD  albuterol (PROVENTIL HFA;VENTOLIN HFA) 108 (90 Base) MCG/ACT inhaler Inhale 1-2 puffs into the lungs every 6 (six) hours as needed for wheezing or shortness of breath. 06/03/16  Yes Barnet Glasgow, NP  guaiFENesin-codeine 100-10 MG/5ML syrup Take 5 mLs by mouth 3 (three) times daily as needed for cough. 06/29/16  Yes Barnet Glasgow, NP  omeprazole (PRILOSEC) 40 MG capsule Take 1 capsule (40 mg total) by mouth daily. 06/29/16  Yes Barnet Glasgow, NP  valACYclovir (VALTREX) 500 MG tablet Take 500 mg by mouth 2 (two) times daily.    Historical Provider, [place]    Review of Systems Constitutional: negative for fevers, chills and anorexia Ears, nose, mouth, throat, and face: negative for sore throat Respiratory: positive for cough, negative for sputum, hemoptysis or wheezing Gastrointestinal: positive for dysphagia, change in bowel habits and constipation, negative for melena and diarrhea Behavioral/Psych: positive for anxiety All other systems reviewed and are negative    Physical Exam: CONSTITUTIONAL:well developed and well nourished and alert  Vitals:   06/30/16 1558  BP: 133/86  Pulse: 87  Temp: 97.7 F (36.5 C)   Eyes: anicteric HENT: no thrush, no cervical lymphadenopathy Respiratory: Normal respiratory effort; CTA B Cardiovascular: RRR  Lab Results  Component Value Date   HIV1RNAQUANT <20 11/30/2015   HIV1RNAQUANT <20 06/02/2015   HIV1RNAQUANT 135 (H) 05/19/2015   No components found for: HIV1GENOTYPRPLUS No components found for: THELPERCELL

## 2016-07-01 DIAGNOSIS — F419 Anxiety disorder, unspecified: Secondary | ICD-10-CM

## 2016-07-01 DIAGNOSIS — F329 Major depressive disorder, single episode, unspecified: Secondary | ICD-10-CM | POA: Insufficient documentation

## 2016-07-01 DIAGNOSIS — F32A Depression, unspecified: Secondary | ICD-10-CM | POA: Insufficient documentation

## 2016-07-01 DIAGNOSIS — K5900 Constipation, unspecified: Secondary | ICD-10-CM | POA: Insufficient documentation

## 2016-07-01 NOTE — Assessment & Plan Note (Signed)
I am unclear if anything organic is causing this such as a motility issue or otherwise.  It sounds like he had a manometry in the past of the esophagus but wonder about colon issues.   ? IBS constipation type I will refer to GI I advised him to get a PCP as well.

## 2016-07-01 NOTE — Assessment & Plan Note (Addendum)
He left without getting labs.  He will need labs by August to continue refills. I am concerned for possible resistance with recent poor compliance.

## 2016-07-01 NOTE — Assessment & Plan Note (Addendum)
He clearly has anxiety and have offered to get him seen by our counselor.    I spent 40 minutes with him including 20 minutes of face to face counseling on his issues and coordination of his care.

## 2016-07-05 ENCOUNTER — Other Ambulatory Visit: Payer: 59

## 2016-07-05 DIAGNOSIS — B20 Human immunodeficiency virus [HIV] disease: Secondary | ICD-10-CM

## 2016-07-05 LAB — COMPLETE METABOLIC PANEL WITH GFR
ALT: 49 U/L — ABNORMAL HIGH (ref 9–46)
AST: 30 U/L (ref 10–40)
Albumin: 4.4 g/dL (ref 3.6–5.1)
Alkaline Phosphatase: 76 U/L (ref 40–115)
BUN: 12 mg/dL (ref 7–25)
CHLORIDE: 98 mmol/L (ref 98–110)
CO2: 30 mmol/L (ref 20–31)
Calcium: 9.4 mg/dL (ref 8.6–10.3)
Creat: 1.05 mg/dL (ref 0.60–1.35)
GFR, EST NON AFRICAN AMERICAN: 89 mL/min (ref >=60)
Glucose, Bld: 107 mg/dL — ABNORMAL HIGH (ref 65–99)
Potassium: 4.3 mmol/L (ref 3.5–5.3)
Sodium: 138 mmol/L (ref 135–146)
Total Bilirubin: 0.4 mg/dL (ref 0.2–1.2)
Total Protein: 6.8 g/dL (ref 6.1–8.1)

## 2016-07-05 LAB — CBC WITH DIFFERENTIAL/PLATELET
BASOS PCT: 0 %
Basophils Absolute: 0 cells/uL (ref 0–200)
EOS ABS: 64 {cells}/uL (ref 15–500)
Eosinophils Relative: 1 %
HEMATOCRIT: 50.3 % — AB (ref 38.5–50.0)
Hemoglobin: 17.1 g/dL (ref 13.2–17.1)
LYMPHS PCT: 31 %
Lymphs Abs: 1984 cells/uL (ref 850–3900)
MCH: 31.3 pg (ref 27.0–33.0)
MCHC: 34 g/dL (ref 32.0–36.0)
MCV: 92.1 fL (ref 80.0–100.0)
MONO ABS: 512 {cells}/uL (ref 200–950)
MONOS PCT: 8 %
MPV: 10.7 fL (ref 7.5–12.5)
Neutro Abs: 3840 cells/uL (ref 1500–7800)
Neutrophils Relative %: 60 %
PLATELETS: 282 10*3/uL (ref 140–400)
RBC: 5.46 MIL/uL (ref 4.20–5.80)
RDW: 13.4 % (ref 11.0–15.0)
WBC: 6.4 10*3/uL (ref 3.8–10.8)

## 2016-07-06 LAB — T-HELPER CELL (CD4) - (RCID CLINIC ONLY)
CD4 T CELL ABS: 560 /uL (ref 400–2700)
CD4 T CELL HELPER: 28 % — AB (ref 33–55)

## 2016-07-07 ENCOUNTER — Encounter: Payer: Self-pay | Admitting: Internal Medicine

## 2016-07-07 LAB — HIV-1 RNA QUANT-NO REFLEX-BLD
HIV 1 RNA Quant: <20 copies/mL — AB
HIV-1 RNA Quant, Log: <1.3 Log copies/mL — AB

## 2016-07-12 ENCOUNTER — Ambulatory Visit: Payer: 59

## 2016-07-12 DIAGNOSIS — F411 Generalized anxiety disorder: Secondary | ICD-10-CM

## 2016-07-12 DIAGNOSIS — F410 Panic disorder [episodic paroxysmal anxiety] without agoraphobia: Secondary | ICD-10-CM

## 2016-07-12 NOTE — BH Specialist Note (Signed)
I met with John Robbins for the first time today and he went into a long story with pressured speech of the past 2 relationships he has been in and how devastated he was when they ended. He is still living in the apartment of his last partner, but is desperately looking for a place. He describes himself as a "people pleaser" and I tried to focus on how important it is to learn to set boundaries with people and make sure your own needs are being met. He complains of poor sleep and I gave suggestions about ways to improve this. He reports daily anxiety and a panic attack a couple of weeks ago. He was tangential at times, moving from one story to the next. Plan to meet again in 2 weeks. Curley Spice, LCSW

## 2016-07-26 ENCOUNTER — Ambulatory Visit (INDEPENDENT_AMBULATORY_CARE_PROVIDER_SITE_OTHER): Payer: 59

## 2016-07-26 DIAGNOSIS — F411 Generalized anxiety disorder: Secondary | ICD-10-CM

## 2016-07-26 DIAGNOSIS — F410 Panic disorder [episodic paroxysmal anxiety] without agoraphobia: Secondary | ICD-10-CM | POA: Diagnosis not present

## 2016-07-26 NOTE — BH Specialist Note (Signed)
John Robbins was in a much calmer mood today, reporting that he finally moved out of his ex boyfriend's apartment this past weekend. He said it was very stressful but it is now done and he feels better. We talked about his tendency to take care of other people too much and I gave him a handout from [employer] called "Cheerleading Statements", designed to help people recognize that it's okay to say no. We completed a treatment plan with goals of him increasing setting limits and to reduce his anxiety.  I told him I will be leaving in one month.  Plan to meet again in one week. Curley Spice, LCSW

## 2016-07-28 ENCOUNTER — Encounter: Payer: Self-pay | Admitting: Physician Assistant

## 2016-07-28 ENCOUNTER — Ambulatory Visit (INDEPENDENT_AMBULATORY_CARE_PROVIDER_SITE_OTHER): Payer: 59 | Admitting: Internal Medicine

## 2016-07-28 ENCOUNTER — Encounter: Payer: Self-pay | Admitting: Internal Medicine

## 2016-07-28 VITALS — BP 121/84 | HR 87 | Temp 97.6°F | Ht 73.0 in | Wt 137.7 lb

## 2016-07-28 DIAGNOSIS — B20 Human immunodeficiency virus [HIV] disease: Secondary | ICD-10-CM

## 2016-07-28 DIAGNOSIS — K649 Unspecified hemorrhoids: Secondary | ICD-10-CM

## 2016-07-28 DIAGNOSIS — F411 Generalized anxiety disorder: Secondary | ICD-10-CM | POA: Diagnosis not present

## 2016-07-28 DIAGNOSIS — R631 Polydipsia: Secondary | ICD-10-CM | POA: Diagnosis not present

## 2016-07-28 DIAGNOSIS — R634 Abnormal weight loss: Secondary | ICD-10-CM | POA: Diagnosis not present

## 2016-07-28 DIAGNOSIS — R1310 Dysphagia, unspecified: Secondary | ICD-10-CM

## 2016-07-28 DIAGNOSIS — K5900 Constipation, unspecified: Secondary | ICD-10-CM

## 2016-07-28 DIAGNOSIS — R21 Rash and other nonspecific skin eruption: Secondary | ICD-10-CM

## 2016-07-28 DIAGNOSIS — Z21 Asymptomatic human immunodeficiency virus [HIV] infection status: Secondary | ICD-10-CM | POA: Diagnosis not present

## 2016-07-28 DIAGNOSIS — F419 Anxiety disorder, unspecified: Secondary | ICD-10-CM

## 2016-07-28 DIAGNOSIS — Z8669 Personal history of other diseases of the nervous system and sense organs: Secondary | ICD-10-CM | POA: Diagnosis not present

## 2016-07-28 DIAGNOSIS — R1313 Dysphagia, pharyngeal phase: Secondary | ICD-10-CM

## 2016-07-28 DIAGNOSIS — R358 Other polyuria: Secondary | ICD-10-CM

## 2016-07-28 DIAGNOSIS — G40909 Epilepsy, unspecified, not intractable, without status epilepticus: Secondary | ICD-10-CM

## 2016-07-28 DIAGNOSIS — R1319 Other dysphagia: Secondary | ICD-10-CM

## 2016-07-28 MED ORDER — LANSOPRAZOLE 15 MG PO CPDR: 15.0000 mg | DELAYED_RELEASE_CAPSULE | Freq: Every day | ORAL | Status: DC

## 2016-07-28 MED ORDER — POLYETHYLENE GLYCOL 3350 17 G PO PACK: 17.0000 g | PACK | Freq: Every day | ORAL | 0 refills | Status: DC

## 2016-07-28 MED ORDER — CETIRIZINE HCL 10 MG PO CAPS: 10.0000 mg | ORAL_CAPSULE | Freq: Every day | ORAL | Status: DC

## 2016-07-28 NOTE — Progress Notes (Signed)
   CC: Establish care for GI symptoms  HPI:  John Robbins is a 41 y.o. male with a past medical history listed below here today to establish care for his constipation and weight loss.   John Robbins is followed by [facility] for his HIV and has not had a PCP for several years since moving to [place] from [place]. Reports a history of generalized anxiety, constipation, Celiac disease and seizures in addition to his HIV.  Most recent labs on 3/27 show a CD4 count of 560 (stable for the past year) with an HIV-1 RNA viral load below the lower limit of quantitation (<20 copies) but above the lower limit for detection. He is current on Triumeq but Dr. Linus Salmons has noted concerns for possible resistance due to the patient's history of non-compliance. He reports complaints of dysphagia and spasms that have been on going issue for several years but reports have become progressively worse. States that he had work up with EGD and colonoscopy in [place] with no abnormalities found. Has complaints of constipation requiring manual maniuplation to have a bowel movement. No bloating or weight loss.  Has been seen in the ED twice in the past 2 months with complaints of cough. Dry, nonproductive. Appears to have been significantly improved following Ativan injection.    Past Medical History:  Diagnosis Date  . Asthma   . Celiac disease   . HIV infection ([place])   . Seizures ([place])     Review of Systems:   Review of Systems  Constitutional: Positive for malaise/fatigue and weight loss. Negative for chills and fever.  Respiratory: Positive for cough and shortness of breath. Negative for sputum production.   Cardiovascular: Negative for chest pain.  Gastrointestinal: Positive for constipation. Negative for abdominal pain, blood in stool, diarrhea, heartburn, melena, nausea and vomiting.  Genitourinary: Positive for frequency.  Musculoskeletal: Positive for back pain and joint pain.  Skin: Positive for rash.  Neurological:  Negative.   Endo/Heme/Allergies: Positive for polydipsia.  Psychiatric/Behavioral: Positive for depression. Negative for suicidal ideas. The patient is nervous/anxious.    Physical Exam:  Vitals:   07/28/16 0918  BP: 121/84  Pulse: 87  Temp: 97.6 F (36.4 C)  TempSrc: Oral  SpO2: 99%  Weight: 137 lb 11.2 oz (62.5 kg)  Height: 6' 1"  (1.854 m)   Physical Exam  Constitutional: He is oriented to person, place, and time and well-developed, well-nourished, and in no distress.  HENT:  Head: Normocephalic and atraumatic.  Right Ear: External ear normal.  Left Ear: External ear normal.  Mouth/Throat: Oropharynx is clear and moist.  Eyes: Conjunctivae are normal. Pupils are equal, round, and reactive to light.  Neck: No thyromegaly present.  Cardiovascular: Normal rate, regular rhythm and normal heart sounds.   Pulmonary/Chest: Effort normal and breath sounds normal.  Abdominal: Soft. Bowel sounds are normal. He exhibits no distension. There is no tenderness.  Lymphadenopathy:    He has no cervical adenopathy.  Neurological: He is alert and oriented to person, place, and time.  Skin: Skin is warm and dry. No rash noted. No erythema.  Psychiatric: Mood and affect normal.  Vitals reviewed.   Assessment & Plan:   See Encounters Tab for problem based charting.  Patient discussed with Dr. Beryle Beams

## 2016-07-28 NOTE — Patient Instructions (Addendum)
John Robbins,  We will get you set up for a barium swallow and to see the GI doctors. Please start taking the miralax.   Please follow up with me 5/30

## 2016-07-29 ENCOUNTER — Other Ambulatory Visit: Payer: Self-pay | Admitting: Internal Medicine

## 2016-07-29 LAB — TSH: TSH: 0.986 u[IU]/mL (ref 0.450–4.500)

## 2016-07-29 LAB — HEMOGLOBIN A1C
Est. average glucose Bld gHb Est-mCnc: 105 mg/dL
Hgb A1c MFr Bld: 5.3 % (ref 4.8–5.6)

## 2016-08-01 ENCOUNTER — Ambulatory Visit (INDEPENDENT_AMBULATORY_CARE_PROVIDER_SITE_OTHER): Payer: 59

## 2016-08-01 ENCOUNTER — Ambulatory Visit (HOSPITAL_COMMUNITY)
Admission: RE | Admit: 2016-08-01 | Discharge: 2016-08-01 | Disposition: A | Discharge status: Home / Self Care | Payer: 59 | Source: Ambulatory Visit | Attending: Oncology | Admitting: Oncology

## 2016-08-01 DIAGNOSIS — R1310 Dysphagia, unspecified: Secondary | ICD-10-CM | POA: Insufficient documentation

## 2016-08-01 DIAGNOSIS — R1319 Other dysphagia: Secondary | ICD-10-CM

## 2016-08-01 DIAGNOSIS — F411 Generalized anxiety disorder: Secondary | ICD-10-CM | POA: Diagnosis not present

## 2016-08-01 NOTE — BH Specialist Note (Signed)
John Robbins said he spent much of the weekend trying to put things away after his recent move. He acknowledged his OCD tendencies but said they are not usually a problem for him. We also discussed his relationship history and codependency. Plan to meet again in one week. Curley Spice, LCSW

## 2016-08-03 DIAGNOSIS — R21 Rash and other nonspecific skin eruption: Secondary | ICD-10-CM | POA: Insufficient documentation

## 2016-08-03 DIAGNOSIS — R1310 Dysphagia, unspecified: Secondary | ICD-10-CM | POA: Insufficient documentation

## 2016-08-03 DIAGNOSIS — R634 Abnormal weight loss: Secondary | ICD-10-CM | POA: Insufficient documentation

## 2016-08-03 NOTE — Progress Notes (Signed)
Medicine attending: Medical history, presenting problems, physical findings, and medications, reviewed with resident physician Dr Maryellen Pile on the day of the patient visit and I concur with his evaluation and management plan.

## 2016-08-03 NOTE — Assessment & Plan Note (Signed)
Reports history of anxiety. Has been on numerous mediations in the past including Zoloft, Effexor, Paxil and Prozac. Reports that the side effects outweighed the benefits he saw from the medications. No interest in taking any further medications for this issue.  Plan Continue to monitor Recommend follow up with Psych

## 2016-08-03 NOTE — Assessment & Plan Note (Signed)
Feeling of food getting stuck in his throat. Only to solids. No pain with swallowing. Coughing/throw up the indigestion. No issues with liquids. No nausea or vomiting.   Plan Barium swallow with no abnormalities noted.  Referral to GI for evaluation and EGD

## 2016-08-03 NOTE — Assessment & Plan Note (Signed)
Seizure disorder > none in 13 years since starting restricted diet. No on any medications. Reports started when he was 41 years old; told by neurologist it was something he may grow out of over time. Became less frequent over time for a while but subsequently started having more seizures (he links it to dietary changes) and was started on Keppra. Had pancreatitis secondary to the [place]. Has not been on any seizure prophylaxis.    Plan: No seizures for several years. Continue to monitor.

## 2016-08-03 NOTE — Assessment & Plan Note (Signed)
Splotched rash on elbows and knees for a few days and went away on its own. 3-4 times over the past several months. No rashes today.  Plan: Recommend follow up when the rash is present so we can evaluate

## 2016-08-03 NOTE — Assessment & Plan Note (Signed)
Most recent labs on 3/27 show a CD4 count of 560 (stable for the past year) with an HIV-1 RNA viral load below the lower limit of quantitation (<20 copies) but above the lower limit for detection. He is current on Triumeq but Dr. Linus Salmons has noted concerns for possible resistance due to the patient's history of non-compliance.   Plan: Follow up with ID

## 2016-08-03 NOTE — Assessment & Plan Note (Signed)
Reports difficulty with bowel movements. No having regular bowel movements. Not dry or hard. Has no urge to have a bowel movement but makes himself go. Lots of straining and has to have physical manipulation to have a bowel movement. Blood with hemorrhoid but otherwise none. Normal size and consistency stool. No melena, abdominal pain or bloating. Intact, normal sensation. EGD and colonoscopy 5 years ago in [place]; had hematochezia at that time. Reports normal results at that time.   Plan: Will give trial of miralax, if no improvement can try Senokot Referral to GI today

## 2016-08-03 NOTE — Assessment & Plan Note (Signed)
Reports 20+ lb weight loss in the last 3-4 months. No change in appetite; reports being hungry all the time. Has changed his eating habits and have less meals and snacking more. Reports difficulty with bowel movements. No having regular bowel movements. Not dry or hard. Has no urge to have a bowel movement but makes himself go. Lots of straining and has to have physical manipulation to have a bowel movement. Blood with hemorrhoid but otherwise none. Normal size and consistency stool. No melena, abdominal pain or bloating. Intact, normal sensation. EGD and colonoscopy 5 years ago in [place]; had hematochezia at that time. Reports normal results at that time. Feeling of food getting stuck in his throat. Only to solids. No pain with swallowing. Coughing/throw up the indigestion. No issues with liquids. No nausea or vomiting.   Does note polyuria and polydipsia. No nights sweats.   Assessment: Patient with numerous complaints and no clear or obvious source. Most recent CBC and CMET from 06/2016 with no anemia or other abnormality with the exception of mildly elevated ALT. Glucose mildly elevated at 107 at that time and was 119 at only other BMET in system. Reports negative EGD and Coloscopy 5 years ago. Unlikely to be colon cancer with no anemia and recent colonoscopy. His HIV appears to be well controlled. No evidence of oral thrush on exam.   Plan: TSH today wnl at 0.986 A1C 5.3 Will refer to GI for evaluation for EGD and possible colonoscopy

## 2016-08-04 ENCOUNTER — Telehealth: Payer: Self-pay

## 2016-08-04 NOTE — Telephone Encounter (Signed)
Patient call to report he was having trouble staying awake requesting walk in appointment  denies taking any medication that would make him drowsy, no other complaints oriented to person place time and situation. Appointment scheduled for 1:15 tomorrow. Advised patient to go to call 911 or go ER if symptoms worsen. Patient verbalized understanding.

## 2016-08-05 ENCOUNTER — Encounter: Payer: Self-pay | Admitting: Physician Assistant

## 2016-08-05 ENCOUNTER — Ambulatory Visit (INDEPENDENT_AMBULATORY_CARE_PROVIDER_SITE_OTHER): Payer: 59 | Admitting: Internal Medicine

## 2016-08-05 ENCOUNTER — Ambulatory Visit (INDEPENDENT_AMBULATORY_CARE_PROVIDER_SITE_OTHER): Payer: 59 | Admitting: Physician Assistant

## 2016-08-05 ENCOUNTER — Ambulatory Visit (INDEPENDENT_AMBULATORY_CARE_PROVIDER_SITE_OTHER): Payer: 59 | Admitting: Dietician

## 2016-08-05 ENCOUNTER — Encounter: Payer: Self-pay | Admitting: Internal Medicine

## 2016-08-05 VITALS — BP 145/100 | HR 89 | Temp 98.2°F | Ht 73.0 in | Wt 137.5 lb

## 2016-08-05 VITALS — BP 122/60 | HR 80 | Ht 71.0 in | Wt 136.0 lb

## 2016-08-05 DIAGNOSIS — K900 Celiac disease: Secondary | ICD-10-CM

## 2016-08-05 DIAGNOSIS — R1310 Dysphagia, unspecified: Secondary | ICD-10-CM

## 2016-08-05 DIAGNOSIS — F32A Depression, unspecified: Secondary | ICD-10-CM

## 2016-08-05 DIAGNOSIS — Z713 Dietary counseling and surveillance: Secondary | ICD-10-CM

## 2016-08-05 DIAGNOSIS — Z681 Body mass index (BMI) 19 or less, adult: Secondary | ICD-10-CM

## 2016-08-05 DIAGNOSIS — R634 Abnormal weight loss: Secondary | ICD-10-CM

## 2016-08-05 DIAGNOSIS — K5900 Constipation, unspecified: Secondary | ICD-10-CM | POA: Diagnosis not present

## 2016-08-05 DIAGNOSIS — F418 Other specified anxiety disorders: Secondary | ICD-10-CM

## 2016-08-05 DIAGNOSIS — F329 Major depressive disorder, single episode, unspecified: Secondary | ICD-10-CM

## 2016-08-05 DIAGNOSIS — R5383 Other fatigue: Secondary | ICD-10-CM

## 2016-08-05 DIAGNOSIS — F419 Anxiety disorder, unspecified: Secondary | ICD-10-CM

## 2016-08-05 MED ORDER — NA SULFATE-K SULFATE-MG SULF 17.5-3.13-1.6 GM/177ML PO SOLN: 1.0000 | ORAL | 0 refills | Status: DC

## 2016-08-05 NOTE — Patient Instructions (Signed)
You have been scheduled for an endoscopy and colonoscopy. Please follow the written instructions given to you at your visit today. Please pick up your prep supplies at the pharmacy within the next 1-3 days. If you use inhalers (even only as needed), please bring them with you on the day of your procedure. Your physician has requested that you go to www.startemmi.com and enter the access code given to you at your visit today. This web site gives a general overview about your procedure. However, you should still follow specific instructions given to you by our office regarding your preparation for the procedure.  You can use Miralax up to four times a day and can try adding a stool softner.

## 2016-08-05 NOTE — Patient Instructions (Signed)
John Robbins,  We will check some blood work for you today.

## 2016-08-05 NOTE — Progress Notes (Signed)
Chief Complaint: Weight loss, constipation, dysphagia  HPI:  John Robbins is a 41 year old Caucasian male with a past medical history of HIV, seizures, celiac disease and others listed below,  who was referred to me by Thayer Headings, MD for a complaint of weight loss, constipation and dysphagia .     Patient brings with him records from his past digestive care Center in [place]. He was seen 08/29/12 for complaints of weight loss, change in bowel habits, constipation, diarrhea, difficulty swallowing and was set up for an EGD and colonoscopy. He also had labs including a CBC, CMP, celiac disease panel, ferritin, iron, TIBC and percent saturation, we do not have these results. Patient then followed up with them in 09/25/12 and was told to continue his gluten-free diet. It was noted that the biopsies during EGD were significant for an incomplete healing of his celiac disease. His dysphagia was thought possibly due to intermittent spasm. Anal bleeding was thought to be due to an anal pathology which had since healed before time of colonoscopy. Patient was then gaining weight.   Today, the patient presents to clinic and tells me that around 15 years ago he had all of these similar symptoms and had evaluation by a gastroenterologist which revealed celiac disease. At that time, he went on a gluten-free diet and has been very diligent about this over the past 15 years. He tells me that 5 years ago he had a repeat of similar symptoms and saw a GI specialist in [place] with reports as above. Patient tells me that they believed at that time that it was his celiac disease that caused most of his symptoms. He has had repeat of all of these symptoms including dysphagia, constipation and weight loss since January of this year. The patient tells me that he does tend not to be as strict on his gluten-free diet when he is in transition and moved to [place] in January and did not have access to his own kitchen and was  eating out more often than usual and could have had "contaminated food". Patient has lost a total of 25 pounds since January.     Patient notes that he has also had trouble with bowel movements, telling me that he was having to digitally manipulating/evacuating his stools in January, he did see internal medicine physician who instructed him to use MiraLAX, the patient tells me that this has increased his frequency but has not changed the consistency of his stools which is typically quite hard, though he has not had to digitally disimpact himself in the past couple of weeks. He denies any bright red blood in his stools.   Patient also describes today that he has a feeling of dysphagia noting that certain things feel as though they are not getting all the way down his throat. Patient did have a recent negative DG of the esophagus on 08/01/16. Patient notes that for a couple of days intermittently off and on he felt like he couldn't eat anything, but recently it has not been as bad.   Patient questions whether this is related to thyroid disease, as that has been questioned in the past, he is being worked up for this by his PCP.   Patient denies fever, chills, blood in the stool, melena, change in diet, fatigue, anorexia, nausea, vomiting, heartburn, reflux or symptoms that awaken him at night.  Past Medical History:  Diagnosis Date  . Anxiety   . Celiac disease   . History of  substance abuse   . HIV infection ([place])   . Raynaud disease   . Seizures ([place])     Past Surgical History:  Procedure Laterality Date  . WISDOM TOOTH EXTRACTION      Current Outpatient Prescriptions  Medication Sig Dispense Refill  . Cetirizine HCl (ZYRTEC ALLERGY) 10 MG CAPS Take 1 capsule (10 mg total) by mouth daily. 30 capsule   . lansoprazole (PREVACID 24HR) 15 MG capsule Take 1 capsule (15 mg total) by mouth daily at 12 noon.    . polyethylene glycol (MIRALAX) packet Take 17 g by mouth daily. 14 each 0  . TRIUMEQ  600-50-300 MG tablet TAKE 1 TABLET BY MOUTH ONCE EVERY DAY 90 tablet 2  . Na Sulfate-K Sulfate-Mg Sulf 17.5-3.13-1.6 GM/180ML SOLN Take 1 kit by mouth as directed. 354 mL 0   No current facility-administered medications for this visit.     Allergies as of 08/05/2016 - Review Complete 08/05/2016  Allergen Reaction Noted  . Gluten meal Other (See Comments) 02/26/2015    Family History  Problem Relation Age of Onset  . Heart disease Father 83  . Hypertension Father   . Heart disease Maternal Grandfather     early 67s  . Hypertension Maternal Grandfather   . Hypertension Mother   . Rheum arthritis Mother   . Crohn's disease Mother   . Crohn's disease Sister   . Rheum arthritis Maternal Grandmother   . Breast cancer Maternal Grandmother   . Colon cancer Paternal Grandmother   . Hypertension Paternal Grandfather     Social History   Social History  . Marital status: Unknown    Spouse name: N/A  . Number of children: N/A  . Years of education: N/A   Occupational History  . Not on file.   Social History Main Topics  . Smoking status: Never Smoker  . Smokeless tobacco: Never Used  . Alcohol use 1.2 oz/week    2 Glasses of wine per week     Comment: ~weekly  . Drug use: No     Comment: Hx of maijuana, ecstacy, GHB, ketamine, amephetamines, cocaine - nothing since 2009  . Sexual activity: Yes    Partners: Male     Comment: Uses Condoms   Other Topics Concern  . Not on file   Social History Narrative   Works in Engineer, technical sales.    Review of Systems:    Constitutional: No fever or chills Skin: No itching Cardiovascular: No chest pain Respiratory: No SOB Gastrointestinal: See HPI and otherwise negative Genitourinary: No dysuria  Neurological: No dizziness Musculoskeletal: No new muscle or joint pain Hematologic: No bleeding or bruising Psychiatric: Positive history of anxiety   Physical Exam:  Vital signs: BP 122/60   Pulse 80 Comment: irregular  Ht 5' 11"  (1.803 m)  Comment: height measured without shoes  Wt 136 lb (61.7 kg)   BMI 18.97 kg/m   Constitutional:   Pleasant thin appearing Caucasian male appears to be in NAD, Well developed, Well nourished, alert and cooperative Head:  Normocephalic and atraumatic. Eyes:   PEERL, EOMI. No icterus. Conjunctiva pink. Ears:  Normal auditory acuity. Neck:  Supple Throat: Oral cavity and pharynx without inflammation, swelling or lesion.  Respiratory: Respirations even and unlabored. Lungs clear to auscultation bilaterally.   No wheezes, crackles, or rhonchi.  Cardiovascular: Normal S1, S2. No MRG. Regular rate and rhythm. No peripheral edema, cyanosis or pallor.  Gastrointestinal:  Soft, nondistended, nontender. No rebound or guarding. Normal bowel sounds. No appreciable  masses or hepatomegaly. Rectal:  Not performed.  Msk:  Symmetrical without gross deformities. Without edema, no deformity or joint abnormality.  Neurologic:  Alert and  oriented x4;  grossly normal neurologically.  Skin:   Dry and intact without significant lesions or rashes. Psychiatric: Demonstrates good judgement and reason without abnormal affect or behaviors.  RELEVANT LABS AND IMAGING: CBC    Component Value Date/Time   WBC 6.4 07/05/2016 1343   RBC 5.46 07/05/2016 1343   HGB 17.1 07/05/2016 1343   HCT 50.3 (H) 07/05/2016 1343   PLT 282 07/05/2016 1343   MCV 92.1 07/05/2016 1343   MCH 31.3 07/05/2016 1343   MCHC 34.0 07/05/2016 1343   RDW 13.4 07/05/2016 1343   LYMPHSABS 1,984 07/05/2016 1343   MONOABS 512 07/05/2016 1343   EOSABS 64 07/05/2016 1343   BASOSABS 0 07/05/2016 1343    CMP     Component Value Date/Time   NA 138 07/05/2016 1343   K 4.3 07/05/2016 1343   CL 98 07/05/2016 1343   CO2 30 07/05/2016 1343   GLUCOSE 107 (H) 07/05/2016 1343   BUN 12 07/05/2016 1343   CREATININE 1.05 07/05/2016 1343   CALCIUM 9.4 07/05/2016 1343   PROT 6.8 07/05/2016 1343   ALBUMIN 4.4 07/05/2016 1343   AST 30 07/05/2016 1343    ALT 49 (H) 07/05/2016 1343   ALKPHOS 76 07/05/2016 1343   BILITOT 0.4 07/05/2016 1343   GFRNONAA 89 07/05/2016 1343   GFRAA >89 07/05/2016 1343   ESOPHOGRAM / BARIUM SWALLOW / BARIUM TABLET STUDY 08/01/16  TECHNIQUE: Combined double contrast and single contrast examination performed using effervescent crystals, thick barium liquid, and thin barium liquid. The patient was observed with fluoroscopy swallowing a 13 mm barium sulphate tablet.  FLUOROSCOPY TIME:  Fluoroscopy Time:  2 minutes  Radiation Exposure Index (if provided by the fluoroscopic device): 7.1 mGy  Number of Acquired Spot Images: 10 (screen captures only)  COMPARISON:  None.  FINDINGS: No evidence of laryngeal penetration or tracheobronchial aspiration.  No evidence of esophageal narrowing or stricture. The 13 mm barium tablet was transiently delayed in the oropharynx but later passed probably into the stomach.  Normal esophageal peristalsis.  Unremarkable gastric folds.  No gastroesophageal reflux was demonstrated.  IMPRESSION: Negative double contrast esophagram.   Electronically Signed   By: Julian Hy M.D.   On: 08/01/2016 11:25  Assessment: 1. Weight loss: 25 pounds, mostly in January per the patient, this is when he moved to the area and was/may have eaten contaminated (gluten) food while he was out to eat 2. Constipation: Patient has changed to constipation, needing to digitally evacuate his stool in the past month, though in the past couple of weeks with MiraLAX he has not had to do this 3. Dysphagia: Patient describes intermittent symptoms of food feeling as though it gets stuck in his throat, this has been better over the past week or so, recent swallow study negative 4. Celiac disease: Diagnosed 15 years ago via EGD, did have repeat EGD 5 years ago with findings of unhealed disease, patient has been maintained on celiac gluten-free diet since that time  Plan: 1. Question  relation of dramatic weight loss to celiac disease versus other. Would recommend we proceed with EGD and colonoscopy for further evaluation. Did discuss risks, benefits, limitations and alternatives and the patient agrees to proceed. These were scheduled with Dr. Ardis Hughs in the [facility]. If EGD negative could consider esophageal manometry? 2. Would recommend the patient maintain a  strict gluten-free diet in the meantime 3. Discussed that the patient can use MiraLAX up to 4 times daily and may need to add a stool softener if he still has a hardened consistency of his stools 4. Discussed dysphagia with the patient, apparently in the past this has been thought related to intermittent spasms, recent swallow study was negative, patient does tell me symptoms are not currently as bad as they were. Reviewed anti-dysphagia measures 5. Patient to return to clinic per recommendations from Dr. Ardis Hughs after time of procedures.  Ellouise Newer, PA-C [facility] 08/05/2016, 12:56 PM  Cc: Thayer Headings, MD

## 2016-08-05 NOTE — Progress Notes (Signed)
  Medical Nutrition Therapy:  Appt start time: 1500 end time:  1600. Visit # 1  Assessment:  Primary concerns today: wants to regain weight after reported 25# weight loss a few months ago.  John Robbins is very knowledgeable about foods, gluten free diet, general nutrition, protein and finds that when he gets stressed that he experiences weight loss even though he feels like he is eating enough. He has had this happen before and it resolved itself without reason, His goal weight is his usual weight which about 155#. His diet recall was very well balanced in fruits, vegetables, dairy and protein but decreased in grains and starchy vegetables. His weight loss occurred in January when he was experiencing very bad constipation/impaction. He also thinks maybe in helping others avoid these foods to help them avoid diabetes he may have begun to cut them out himself.  He reports having been very active before recent illness working out 45x/week at [employer], running and walking a dog two times a day.  Preferred Learning Style: No preference indicated  Learning Readiness: Ready and Change in progress  ANTHROPOMETRICS: weight-137.5#, height-6'1", BMI-18.14- underweight with possible mild malnutrition, appears to have lost fat and muscle mass WEIGHT HISTORY: Highest: 165# Lowest-135# SLEEP:6-7 hours a night Food Intolerances: gluten, no Nausea/Vomiting/Diarrhea/hair loss reported Constipation: did have but has been better for past few weeks with one formed stool per day  MEDICATIONS/herbs/supplements- zinc 2x/week, valerian root daily, melatonin sometimes, B12 almost daily DIETARY INTAKE: Usual eating pattern includes 3 meals and 6 snacks per day. Everyday foods- whole milk yogurt and milk, granola, peanut butter, nuts, almond butter, fruits and vetables, fish, chicken, milkshakes, fritos.   24-hr recall: showed a total af about 3700 calories intake reported.  Beverages:mostly water, some milkshakes, some  almond milk  Usual physical activity: walking  Estimated daily energy & nutrition needs: 3500-4000 calories/day 2000 calories of carbs, <60 grams added sugar, 42 grams fiber 80-120 grams protein/day  Progress Towards Goal(s):  In progress.   Nutritional Diagnosis:  [place]-3.2 Unintentional weight loss As related to decerased calories consumption due to decreased amounts of grains/starchy foods.  As evidenced by his food recall and weight loss.    Intervention:  Nutrition assessment and education about well blacned meals and snacks, need for whole grain carbs for energy and fiber. Limit added sugars. Coordination of care:   Teaching Method Utilized: Visual, Auditory, Hands on Handouts given during visit include:avs Barriers to learning/adherence to lifestyle change: competing values Demonstrated degree of understanding via:  Teach Back   Monitoring/Evaluation:  Dietary intake, exercise, and body weight prn  Edit John Robbins, John Robbins, RD 08/05/2016 4:35 PM. .

## 2016-08-05 NOTE — Progress Notes (Signed)
Mr.   CC: Cannot stay awake  HPI:  [name] is a 41 y.o. male with a past medical history listed below here today with complaints of being excessively tired and sleeping.   For details of today's visit and the status of his chronic medical issues please refer to the assessment and plan.    Past Medical History:  Diagnosis Date  . Anxiety   . Celiac disease   . History of substance abuse   . HIV infection ([place])   . Raynaud disease   . Seizures ([place])     Review of Systems:   No chest pain or shortness of breath  Physical Exam:  Vitals:   08/05/16 1343  BP: (!) 145/100  Pulse: 89  Temp: 98.2 F (36.8 C)  TempSrc: Oral  SpO2: 100%  Weight: 137 lb 8 oz (62.4 kg)  Height: 6' 1"  (1.854 m)   Physical Exam  Constitutional: He is oriented to person, place, and time and well-developed, well-nourished, and in no distress.  HENT:  Head: Normocephalic and atraumatic.  Neck: No thyromegaly present.  Cardiovascular: Normal rate, regular rhythm and normal heart sounds.   Pulmonary/Chest: Effort normal and breath sounds normal.  Abdominal: Soft. Bowel sounds are normal. He exhibits no distension. There is no tenderness.  Neurological: He is alert and oriented to person, place, and time.  Skin: Skin is warm and dry.  Psychiatric: His mood appears anxious. He exhibits a depressed mood.  Vitals reviewed.    Assessment & Plan:   See Encounters Tab for problem based charting.  Patient discussed with Dr. Lynnae January

## 2016-08-05 NOTE — Patient Instructions (Addendum)
You need a least 2,200 Calories/day to maintain your weight.   You need 2,700 Calories/day to gain 1 lb per week.   You need 3,200 Calories/day to gain 2 lb per week.   WE estimated that you are eating about 3700 calories each day  More carbs suggest 14 grams fiber per 1000 calories-   For 3000 calories that is 42 grams fiber per day.

## 2016-08-06 LAB — VITAMIN D 25 HYDROXY (VIT D DEFICIENCY, FRACTURES): VIT D 25 HYDROXY: 26.3 ng/mL — AB (ref 30.0–100.0)

## 2016-08-06 LAB — T3: T3, Total: 138 ng/dL (ref 71–180)

## 2016-08-06 LAB — IRON AND TIBC
IRON SATURATION: 45 % (ref 15–55)
Iron: 141 ug/dL (ref 38–169)
Total Iron Binding Capacity: 316 ug/dL (ref 250–450)
UIBC: 175 ug/dL (ref 111–343)

## 2016-08-06 LAB — FERRITIN: Ferritin: 196 ng/mL (ref 30–400)

## 2016-08-06 LAB — VITAMIN B12: Vitamin B-12: 1343 pg/mL — ABNORMAL HIGH (ref 232–1245)

## 2016-08-06 LAB — T4, FREE: FREE T4: 1.38 ng/dL (ref 0.82–1.77)

## 2016-08-06 LAB — TSH: TSH: 3.33 u[IU]/mL (ref 0.450–4.500)

## 2016-08-08 ENCOUNTER — Telehealth: Payer: Self-pay | Admitting: Internal Medicine

## 2016-08-08 ENCOUNTER — Encounter: Payer: Self-pay | Admitting: Internal Medicine

## 2016-08-08 DIAGNOSIS — R5383 Other fatigue: Secondary | ICD-10-CM | POA: Insufficient documentation

## 2016-08-08 MED ORDER — VITAMIN D (CHOLECALCIFEROL) 25 MCG (1000 UT) PO CAPS: 1.0000 | ORAL_CAPSULE | Freq: Every day | ORAL | 2 refills | Status: DC

## 2016-08-08 NOTE — Assessment & Plan Note (Signed)
Requesting referral to nutrition for his celiac disease.  Will refer today.

## 2016-08-08 NOTE — Progress Notes (Signed)
I agree with the above note, plan 

## 2016-08-08 NOTE — Assessment & Plan Note (Signed)
John Robbins presents today with complaints of excessive fatigue and sleepiness. He reports that over the past several days he has had no energy and has been sleeping 10+ hours a night. During the day he has had difficulty completing work due to fatigue and falling asleep. He denies any increased stressors at work and his work load has actually been less than normal. He reports similar issues in the past that have resolved on their own. Does report significant stress and worry about his health as well as recently moving to [place] and going through a recent break-up.   He reports fatigue to a lesser degree in the past when consuming gluten but it usually is acute onset after a meal and starts with headache and abdominal bloating, neither of which have occurred recently. He does report eating out more frequently recently.   PHQ9 today is 12. GAD7 today is 18.   Plan: Ferritin today wnl and iron studies unremarkable. TSH, free T4 and T3 all wnl. B12 wnl. Vitamin D is mildly low at 26.3   Will give Rx for Vitamin D.

## 2016-08-08 NOTE — Telephone Encounter (Signed)
Patient would like a call back about a medication discussed during his 08/05/2016 visit.

## 2016-08-08 NOTE — Assessment & Plan Note (Signed)
PHQ9 today is 12. GAD7 today is 18.  Reports being on numerous SSRI in the past with little to no benefit and adverse side effects. Discussed buspar for his anxiety as that seems to be his biggest concern currently. However, could worsen his excessive fatigue.   Plan: provided information on buspar today. Consider starting in the future.

## 2016-08-09 ENCOUNTER — Encounter: Payer: Self-pay | Admitting: Internal Medicine

## 2016-08-09 NOTE — Telephone Encounter (Signed)
Pt needs to speak with a nurse about meds. Please call back.

## 2016-08-10 ENCOUNTER — Ambulatory Visit: Payer: 59

## 2016-08-10 MED ORDER — BUSPIRONE HCL 5 MG PO TABS: 5.0000 mg | ORAL_TABLET | Freq: Three times a day (TID) | ORAL | 1 refills | Status: DC

## 2016-08-10 NOTE — Telephone Encounter (Signed)
Have spoken to pt, he sent emails and dr boswell and myself followed up via ph and email. Pt has changed pharmacies, profile is now correct, med is at correct pharm

## 2016-08-10 NOTE — Progress Notes (Signed)
Internal Medicine Clinic Attending  Case discussed with Dr. Boswell at the time of the visit.  We reviewed the resident's history and exam and pertinent patient test results.  I agree with the assessment, diagnosis, and plan of care documented in the resident's note.  

## 2016-08-10 NOTE — Telephone Encounter (Signed)
John Robbins,  I thought after our discussion you were hesitant to try the Buspar given the side effects. I am more than happy to send in the prescription to your pharmacy now. Sorry for the miscommunication.

## 2016-08-17 ENCOUNTER — Encounter (HOSPITAL_COMMUNITY): Payer: Self-pay | Admitting: Emergency Medicine

## 2016-08-17 ENCOUNTER — Emergency Department (HOSPITAL_COMMUNITY)
Admission: EM | Admit: 2016-08-17 | Discharge: 2016-08-17 | Disposition: A | Discharge status: Home / Self Care | Payer: 59 | Attending: Emergency Medicine | Admitting: Emergency Medicine

## 2016-08-17 DIAGNOSIS — R9431 Abnormal electrocardiogram [ECG] [EKG]: Secondary | ICD-10-CM | POA: Diagnosis not present

## 2016-08-17 DIAGNOSIS — R531 Weakness: Secondary | ICD-10-CM | POA: Insufficient documentation

## 2016-08-17 LAB — CBC WITH DIFFERENTIAL/PLATELET
BASOS ABS: 0 10*3/uL (ref 0.0–0.1)
Basophils Relative: 0 %
EOS ABS: 0.1 10*3/uL (ref 0.0–0.7)
EOS PCT: 2 %
HCT: 47.1 % (ref 39.0–52.0)
Hemoglobin: 16.2 g/dL (ref 13.0–17.0)
Lymphocytes Relative: 31 %
Lymphs Abs: 1.9 10*3/uL (ref 0.7–4.0)
MCH: 31.3 pg (ref 26.0–34.0)
MCHC: 34.4 g/dL (ref 30.0–36.0)
MCV: 91.1 fL (ref 78.0–100.0)
MONO ABS: 0.4 10*3/uL (ref 0.1–1.0)
Monocytes Relative: 7 %
Neutro Abs: 3.6 10*3/uL (ref 1.7–7.7)
Neutrophils Relative %: 60 %
PLATELETS: 211 10*3/uL (ref 150–400)
RBC: 5.17 MIL/uL (ref 4.22–5.81)
RDW: 12.1 % (ref 11.5–15.5)
WBC: 6 10*3/uL (ref 4.0–10.5)

## 2016-08-17 LAB — COMPREHENSIVE METABOLIC PANEL
ALBUMIN: 4.2 g/dL (ref 3.5–5.0)
ALT: 52 U/L (ref 17–63)
ANION GAP: 9 (ref 5–15)
AST: 36 U/L (ref 15–41)
Alkaline Phosphatase: 71 U/L (ref 38–126)
BUN: 8 mg/dL (ref 6–20)
CALCIUM: 9.1 mg/dL (ref 8.9–10.3)
CHLORIDE: 105 mmol/L (ref 101–111)
CO2: 27 mmol/L (ref 22–32)
CREATININE: 0.92 mg/dL (ref 0.61–1.24)
GFR calc non Af Amer: >60 mL/min (ref >60)
Glucose, Bld: 69 mg/dL (ref 65–99)
Potassium: 3.9 mmol/L (ref 3.5–5.1)
Sodium: 141 mmol/L (ref 135–145)
TOTAL PROTEIN: 6.5 g/dL (ref 6.5–8.1)
Total Bilirubin: 0.6 mg/dL (ref 0.3–1.2)

## 2016-08-17 NOTE — ED Provider Notes (Signed)
[facility] DEPT Provider Note   CSN: [id number] Arrival date & time: 08/17/16  1207  By signing my name below, I, Marcello Moores, attest that this documentation has been prepared under the direction and in the presence of Carmin Muskrat, MD. Electronically Signed: Marcello Moores, ED Scribe. 08/17/16. 2:26 PM.   History   Chief Complaint Chief Complaint  Patient presents with  . Fatigue     The history is provided by the patient. No language interpreter was used.   HPI Comments: John Robbins is a 41 y.o. male, with a PMHx of celiac disease, HIV, and Raynaud's disease, who presents to the Emergency Department complaining of improving fatigue that began yesterday. Pt indicates that he was only awake 2 hours total yesterday, and had difficulty staying awake as well as staying asleep through the night, waking up with night sweats. He also states that he has had similar symptoms for a 4 day period ~2 weeks ago, and states he felt otherwise well per his current baseline before and after the symptoms. He also notes that his symptoms resolved in between the two episodes. Pt has associated decreased grip strength, discoordination, and loss of depth perception during the few hours that he is awake. He additional reports that during the periods of fatigue, his hands are red and swollen, which is unusual from his baseline of white and cold hands secondary to Raynaud's disease. Of note, pt started Buspar between his periods of fatigue, which alleviated his anxiety but did not affect his fatigue.   Of note, the pt reports an overall decrease in energy accompanied by 25 lb weight loss and SOB on exertion over the last 5 months, for which he has been evaluated by his physician. Pt reports he previously went to the gym 5d/w before 5 months ago, but since the onset of these symptoms, he has not been at all. The pt indicates that he has had increased stressors in his life over the last 5 months, but no  recent travel. He mentions that he has had previous thyroid testing, and routine blood work. He has also recalls that he has had barium testing to r/o GERD. He also has a colonoscopy and endoscopy scheduled for next month. Pt personally believes that his symptoms are somewhat related to his celiac disease, where he has experienced similar, but milder symptoms in the past. He states he was completely well prior to 5 months ago. The pt has been started on Prevacid and vitamin D by his PCP with the onset of his complaints. Pt denies abdominal pain.   Past Medical History:  Diagnosis Date  . Anxiety   . Celiac disease   . History of substance abuse   . HIV infection ([place])   . Raynaud disease   . Seizures [facility])     Patient Active Problem List   Diagnosis Date Noted  . Fatigue 08/08/2016  . Dysphagia 08/03/2016  . Weight loss 08/03/2016  . Rash and nonspecific skin eruption 08/03/2016  . Constipation 07/01/2016  . Anxiety and depression 07/01/2016  . Human immunodeficiency virus (HIV) disease ([place]) 02/26/2015  . Screening examination for venereal disease 02/26/2015  . Encounter for long-term (current) use of medications 02/26/2015  . Celiac disease 02/26/2015  . Seizure disorder ([place]) 02/26/2015    Past Surgical History:  Procedure Laterality Date  . WISDOM TOOTH EXTRACTION         Home Medications    Prior to Admission medications   Medication Sig Start Date End  Date Taking? Authorizing Provider  busPIRone (BUSPAR) 5 MG tablet Take 1 tablet (5 mg total) by mouth 3 (three) times daily. 08/10/16   Maryellen Pile, MD  Cetirizine HCl (ZYRTEC ALLERGY) 10 MG CAPS Take 1 capsule (10 mg total) by mouth daily. 07/28/16   Maryellen Pile, MD  lansoprazole (PREVACID 24HR) 15 MG capsule Take 1 capsule (15 mg total) by mouth daily at 12 noon. 07/28/16   Maryellen Pile, MD  Na Sulfate-K Sulfate-Mg Sulf 17.5-3.13-1.6 GM/180ML SOLN Take 1 kit by mouth as directed. 08/05/16   Levin Erp,  PA  polyethylene glycol [facility]) packet Take 17 g by mouth daily. 07/28/16   Maryellen Pile, MD  TRIUMEQ 600-50-300 MG tablet TAKE 1 TABLET BY MOUTH ONCE EVERY DAY 07/29/16   Comer, Okey Regal, MD  Vitamin D, Cholecalciferol, 1000 units CAPS Take 1 capsule by mouth daily. 08/08/16   Maryellen Pile, MD    Family History Family History  Problem Relation Age of Onset  . Heart disease Father 59  . Hypertension Father   . Heart disease Maternal Grandfather     early 75s  . Hypertension Maternal Grandfather   . Hypertension Mother   . Rheum arthritis Mother   . Crohn's disease Mother   . Crohn's disease Sister   . Rheum arthritis Maternal Grandmother   . Breast cancer Maternal Grandmother   . Colon cancer Paternal Grandmother   . Hypertension Paternal Grandfather     Social History Social History  Substance Use Topics  . Smoking status: Never Smoker  . Smokeless tobacco: Never Used  . Alcohol use 1.2 oz/week    2 Glasses of wine per week     Comment: ~weekly     Allergies   Gluten meal   Review of Systems Review of Systems  Constitutional: Positive for diaphoresis and fatigue.  Respiratory: Positive for shortness of breath.   Cardiovascular: Negative for chest pain.  Gastrointestinal: Negative for abdominal pain.  Skin: Positive for color change.  Allergic/Immunologic: Positive for immunocompromised state.  Neurological: Positive for light-headedness.  Hematological: Bruises/bleeds easily.  Psychiatric/Behavioral: Positive for dysphoric mood. The patient is nervous/anxious.      Physical Exam Updated Vital Signs BP 129/85 (BP Location: Right Arm)   Pulse 89   Temp 98.7 F (37.1 C) (Oral)   Resp 16   Ht 6' 1"  (1.854 m)   Wt 137 lb (62.1 kg)   SpO2 98%   BMI 18.07 kg/m   Physical Exam  Constitutional: He is oriented to person, place, and time. He appears well-developed. No distress.  HENT:  Head: Normocephalic and atraumatic.  Eyes: Conjunctivae and EOM are  normal.  Cardiovascular: Normal rate and regular rhythm.   Pulmonary/Chest: Effort normal. No stridor. No respiratory distress.  Abdominal: He exhibits no distension.  Musculoskeletal: He exhibits no edema.  Neurological: He is alert and oriented to person, place, and time.  Skin: Skin is warm and dry.  Psychiatric: He has a normal mood and affect.  Nursing note and vitals reviewed.    ED Treatments / Results   DIAGNOSTIC STUDIES: Oxygen Saturation is 98% on RA, normal by my interpretation.   COORDINATION OF CARE: 2:00 PM-Discussed next steps with pt. Pt verbalized understanding and is agreeable with the plan.    Labs (all labs ordered are listed, but only abnormal results are displayed) Labs Reviewed  COMPREHENSIVE METABOLIC PANEL  CBC WITH DIFFERENTIAL/PLATELET     Procedures Procedures (including critical care time)  Medications Ordered in ED Medications -  No data to display   Initial Impression / Assessment and Plan / ED Course  I have reviewed the triage vital signs and the nursing notes.  Pertinent labs & imaging results that were available during my care of the patient were reviewed by me and considered in my medical decision making (see chart for details).     On repeat exam the patient is awake and alert. We discussed lab findings, his prior evaluation, his ongoing therapy for HIV, and today's presentation. We discussed the importance of following up with his infectious disease doctor, as well as with a rheumatologist given his history of medical mistake, and rheumatologic disorder. Patient has not seen a rheumatologist ever. Here, the patient remains awake, alert, hemodynamically stable, with no evidence for new pathology, no evidence for bacteremia, sepsis, no evidence for AIDS defining illness. Patient appropriate for further evaluation, management as an outpatient.  Final Clinical Impressions(s) / ED Diagnoses   Final diagnoses:  Weakness    I  personally performed the services described in this documentation, which was scribed in my presence. The recorded information has been reviewed and is accurate.       Carmin Muskrat, MD 08/17/16 [phone]

## 2016-08-17 NOTE — Discharge Instructions (Signed)
As discussed, your evaluation today has been largely reassuring.  But, it is important that you monitor your condition carefully, and do not hesitate to return to the ED if you develop new, or concerning changes in your condition. ? ?Otherwise, please follow-up with your physician for appropriate ongoing care. ? ?

## 2016-08-17 NOTE — ED Triage Notes (Signed)
Pt reporting feeling general fatigue for the past 4 days. Pt states he is sleeping for most of the day and then waking up all during the night. Pt denies any pain or other symptoms. Pt is awake and alert in triage. Answers questions appropriately. Pt walked here form apartment.

## 2016-08-26 ENCOUNTER — Ambulatory Visit (INDEPENDENT_AMBULATORY_CARE_PROVIDER_SITE_OTHER): Payer: 59 | Admitting: Internal Medicine

## 2016-08-26 VITALS — BP 124/82 | HR 68 | Temp 98.2°F | Wt 145.8 lb

## 2016-08-26 DIAGNOSIS — Z0000 Encounter for general adult medical examination without abnormal findings: Secondary | ICD-10-CM

## 2016-08-26 DIAGNOSIS — F32A Depression, unspecified: Secondary | ICD-10-CM

## 2016-08-26 DIAGNOSIS — F419 Anxiety disorder, unspecified: Secondary | ICD-10-CM

## 2016-08-26 DIAGNOSIS — M7989 Other specified soft tissue disorders: Secondary | ICD-10-CM

## 2016-08-26 DIAGNOSIS — Z23 Encounter for immunization: Secondary | ICD-10-CM | POA: Diagnosis not present

## 2016-08-26 DIAGNOSIS — M545 Low back pain, unspecified: Secondary | ICD-10-CM

## 2016-08-26 DIAGNOSIS — Z125 Encounter for screening for malignant neoplasm of prostate: Secondary | ICD-10-CM | POA: Insufficient documentation

## 2016-08-26 DIAGNOSIS — M549 Dorsalgia, unspecified: Secondary | ICD-10-CM | POA: Insufficient documentation

## 2016-08-26 DIAGNOSIS — F329 Major depressive disorder, single episode, unspecified: Secondary | ICD-10-CM

## 2016-08-26 DIAGNOSIS — F418 Other specified anxiety disorders: Secondary | ICD-10-CM | POA: Diagnosis not present

## 2016-08-26 MED ORDER — BUSPIRONE HCL 10 MG PO TABS: 10.0000 mg | ORAL_TABLET | Freq: Three times a day (TID) | ORAL | 2 refills | Status: DC

## 2016-08-26 MED ORDER — CITALOPRAM HYDROBROMIDE 10 MG PO TABS: 10.0000 mg | ORAL_TABLET | Freq: Every day | ORAL | 2 refills | Status: DC

## 2016-08-26 NOTE — Progress Notes (Signed)
   CC: Fatigue, hand swelling, anxiety   HPI:  [name] is a 41 y.o. male with past medical history outlined below here with multiple complaints of fatigue, hand swelling, and anxiety. For the details of today's visit, please refer to the assessment and plan.  Past Medical History:  Diagnosis Date  . Anxiety   . Celiac disease   . History of substance abuse   . HIV infection ([place])   . Raynaud disease   . Seizures ([place])     Review of Systems:  All pertinents listed in HPI, otherwise negative  Physical Exam:  Vitals:   08/26/16 1336  BP: 124/82  Pulse: 68  Temp: 98.2 F (36.8 C)  TempSrc: Oral  SpO2: 99%  Weight: 145 lb 12.8 oz (66.1 kg)    Constitutional: NAD, appears comfortable Cardiovascular: RRR Pulmonary/Chest: CTAB Extremities: Bilateral hands with erythema over his MCP, PIP, and DIP joints. No evidence of swelling or skin tightening. Warm and well perfused. Joints appear normal.  Neurological: A&Ox3, CN II - XII grossly intact.  Psychiatric: Anxious affect, pacing room   Assessment & Plan:   See Encounters Tab for problem based charting.  Patient discussed with Dr. Daryll Drown

## 2016-08-26 NOTE — Addendum Note (Signed)
Addended by: Truddie Crumble on: 08/26/2016 04:54 PM   Modules accepted: Orders

## 2016-08-26 NOTE — Assessment & Plan Note (Signed)
Patient is here today with complaint of acute back pain 3 days. He denies any injury or inciting event to the pain. He denies any red flag symptoms, no fevers, no urinary or fecal incontinence. He denies radiation to his legs. On exam he has pinpoint tenderness over his lumbar spine. Range of motion is intact. He reports pain is worse with sitting and he is standing today on exam to relieve the pain.  -- Pelvic x-ray to look for sacroiliac inflammation (r/o ankylosing spondylitis)  -- Lumbar x-ray r/o compression fracture (possibly exacerbated by an underlying inflammatory condition)

## 2016-08-26 NOTE — Assessment & Plan Note (Addendum)
Patient is here today with complaint of intermittent hand swelling and fatigue. Patient reports a history of Raynaud's where is fingers will turn white and become very cold. Recently, he fingers have now started to turn red and become very warm and swollen. This is unusual for him. He describes tightening of the skin overlying his fingers and reports it is very painful. Today on exam the skin overlying his joints are erythematous but I do not appreciate any skin tightening or swelling. He reports that the episodes come and go, and he has not had an episode today. He also reports extreme fatigue that is associated with the episodes of hand swelling. He reports when his hands swell, he becomes very sleepy and can only stay awake for a few hours a day. Of note - patient also has a history of dysphagia for which is he currently being worked up. He also reports that his symptoms of dysphagia come and go, but have improved recently on PPI therapy. He had a normal barium swallow study and was referred to GI for an EGD. Given his constellation of symptoms (Hx of raynaud's, dysphagia, now with intermittent hand swelling) I am concerned for some form or scleroderma or CREST syndrome. We will check scleroderma labs today. If those are negative, consider checking for Lupus or other autoimmune disorders to rule out an overlap syndrome. I have also placed a rheumatology referral.  -- ANA -- anti-topoisomerase  -- anti-centromere  -- anti- RNA polymerase  -- Pending results, consider checking Lupus labs  -- Rheumatology referral   ADDENDUM: All labs resulted normal. Called patient and left voicemail with results. Instructed patient to call back with any results. He is scheduled to follow up 09/07/16.

## 2016-08-26 NOTE — Assessment & Plan Note (Signed)
Tdap vaccine given today.

## 2016-08-26 NOTE — Assessment & Plan Note (Addendum)
Patient is endorsing symptoms of severe generalized anxiety that are interfering with his day-to-day activities. He denies depression, but does endorse symptoms of anhedonia and is no longer working out or doing things that he previously enjoyed. He endorses occasionally hearing voices on days that his anxiety is severe. He has an appointment with a therapist today to establish care. I encouraged him to keep this appointment. He was started on BuSpar by his primary care physician on 08/08/2016. He feels his medication is helping somewhat. Today we discussed increasing the dose of BuSpar and adding a second medication like citalopram. Patient is agreeable. He has been on multiple anti anxiety and anti depressive medications in the past but has never been on citalopram. Given his auditory hallucinations, I also recommended psychiatry referral. Patient was initially hesitant to this idea. He reported gerneral mistrust in psychiatrists stemming from his childhood psychiatric treatment and history. However he has agreed to make the appointment. We discuss canceling the appointment if his anxiety improves with increased dose of BuSpar and citalopram. He has follow-up scheduled with his PCP on 09/07/2016. -- Increase Buspar to 10 mg TID -- Start citalopram 10 mg QHS  -- F/u PCP

## 2016-08-26 NOTE — Patient Instructions (Addendum)
John Robbins,   It was a pleasure to see you today. I am sorry you haven't been feeling well. I have ordered some rheumatologic labs to look for scleroderma. I will call you next week with the results. I have also ordered x-rays of your back. Please go upstairs to the first floor radiology department to have these done. I have placed another rheumatology referral. We will try and get you in with another practice.  For your anxiety, I have increased the dose of your Buspar to 10 mg TID. Please continue to take this in addition to the new medication called citalopram (aka Celexa). Please take citalopram every night before bed. I have placed a psychiatry referral. You will be called to schedule an appointment.   If you have any questions or concerns, call our clinic at [phone] or after hours call [phone] and ask for the internal medicine resident on call. Thank you!  - Dr. Philipp Ovens

## 2016-08-29 LAB — ANA+ENA+DNA/DS+ANTICH+CENTR
ANA Titer 1: NEGATIVE
Centromere Ab Screen: <0.2 AI (ref 0.0–0.9)
Chromatin Ab SerPl-aCnc: <0.2 AI (ref 0.0–0.9)
DSDNA AB: 2 [IU]/mL (ref 0–9)
ENA RNP Ab: 0.2 AI (ref 0.0–0.9)
ENA SM Ab Ser-aCnc: <0.2 AI (ref 0.0–0.9)
ENA SSB (LA) Ab: <0.2 AI (ref 0.0–0.9)
Scleroderma SCL-70: 0.5 AI (ref 0.0–0.9)

## 2016-08-29 NOTE — Progress Notes (Signed)
Internal Medicine Clinic Attending  Case discussed with Dr. Philipp Ovens at the time of the visit.  We reviewed the resident's history and exam and pertinent patient test results.  I agree with the assessment, diagnosis, and plan of care documented in the resident's note.  Dr. Philipp Ovens and I discussed differential at length.  Initial work up as per her note.

## 2016-08-31 LAB — RNA POLYMERASE III IGG ABS: RNA POLYMERASE III IGG ABS: 19.5 U/mL

## 2016-09-06 ENCOUNTER — Encounter: Payer: Self-pay | Admitting: Gastroenterology

## 2016-09-07 ENCOUNTER — Telehealth: Payer: Self-pay

## 2016-09-07 ENCOUNTER — Encounter: Payer: 59 | Admitting: Internal Medicine

## 2016-09-07 NOTE — Telephone Encounter (Signed)
Call to patient to inquire about scheduling appointment at [facility] no answer left message and number to call [facility]

## 2016-09-12 NOTE — Telephone Encounter (Signed)
Calling back to speak with Joycelyn Schmid. Please call pt mother back.

## 2016-09-13 ENCOUNTER — Telehealth: Payer: Self-pay

## 2016-09-13 NOTE — Telephone Encounter (Signed)
Spoke with patient and his mother they wanted to know if I could pull some strings to get patient seen quicker at [facility] because they can not see patient until Aug. Advised them that it was not possible for me to get [facility] to see patient sooner because they are backed up. Advised that he could do a walk in today at [facility] or he could possibly get an earlier appointment at [facility], or he could be seen at [facility] but would have to pay 150 dollars up front. Patient declined all options stated that a counselor is supposed to call tomorrow and maybe they will set something up. Encouraged patient to go to [facility] today

## 2016-09-14 ENCOUNTER — Telehealth: Payer: Self-pay

## 2016-09-14 NOTE — Telephone Encounter (Signed)
Call from patient he reports he needs a note to continue to be out of work on short term disability. He reports that the ER doctor put return to work date to be determined. Advised patient that I would route his concerns to Dr. Charlynn Grimes.

## 2016-09-16 ENCOUNTER — Encounter: Payer: Self-pay | Admitting: Internal Medicine

## 2016-09-19 ENCOUNTER — Encounter: Payer: 59 | Admitting: Gastroenterology

## 2016-09-20 ENCOUNTER — Ambulatory Visit (INDEPENDENT_AMBULATORY_CARE_PROVIDER_SITE_OTHER): Payer: 59 | Admitting: Medical

## 2016-09-20 ENCOUNTER — Telehealth: Payer: Self-pay

## 2016-09-20 ENCOUNTER — Encounter: Payer: Self-pay | Admitting: Medical

## 2016-09-20 ENCOUNTER — Telehealth: Payer: Self-pay | Admitting: Medical

## 2016-09-20 VITALS — BP 118/68 | HR 84 | Ht 72.0 in | Wt 147.0 lb

## 2016-09-20 DIAGNOSIS — K900 Celiac disease: Secondary | ICD-10-CM

## 2016-09-20 DIAGNOSIS — Z113 Encounter for screening for infections with a predominantly sexual mode of transmission: Secondary | ICD-10-CM | POA: Diagnosis not present

## 2016-09-20 DIAGNOSIS — B20 Human immunodeficiency virus [HIV] disease: Secondary | ICD-10-CM

## 2016-09-20 DIAGNOSIS — F329 Major depressive disorder, single episode, unspecified: Secondary | ICD-10-CM

## 2016-09-20 DIAGNOSIS — M7989 Other specified soft tissue disorders: Secondary | ICD-10-CM | POA: Diagnosis not present

## 2016-09-20 DIAGNOSIS — F32A Depression, unspecified: Secondary | ICD-10-CM

## 2016-09-20 DIAGNOSIS — F419 Anxiety disorder, unspecified: Secondary | ICD-10-CM

## 2016-09-20 DIAGNOSIS — H9313 Tinnitus, bilateral: Secondary | ICD-10-CM

## 2016-09-20 DIAGNOSIS — I7300 Raynaud's syndrome without gangrene: Secondary | ICD-10-CM | POA: Insufficient documentation

## 2016-09-20 DIAGNOSIS — R5383 Other fatigue: Secondary | ICD-10-CM

## 2016-09-20 DIAGNOSIS — Z202 Contact with and (suspected) exposure to infections with a predominantly sexual mode of transmission: Secondary | ICD-10-CM | POA: Diagnosis not present

## 2016-09-20 DIAGNOSIS — M2550 Pain in unspecified joint: Secondary | ICD-10-CM

## 2016-09-20 NOTE — Progress Notes (Signed)
Subjective: Chief Complaint  Patient presents with  . New Patient (Initial Visit)    new patient,night sweat, fatuige, bodyaches, ringing in ears started last nov.    Here as a new patient.  Has several concerns.  Was seeing [facility].  Wanted to try a different PCP.  He sees infectious disease for HIV.   Has been seeing Dr. Samantha Crimes psychiatry.  He hasn't established yet with psychiatry here in [place] due to long waiting lists to get in for appt  Wants test for syphilis, STD testing.    Was seeing [facility] prior but was tired of every visit seeing a new provider, different diagnosis or theory every time.  Has HIV, sees Infectious Disease - Dr. Scharlene Gloss  He has a few different concerns to discuss.  Gets ringing in ears.  He thinks there is something wrong with his hearing.  Can't tune out electronic noise.  Works in Games developer.   Can't tune out electronic noise anywhere.  Has had this issues since 02/2016  High pitch sounds.   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  Has had issues with ears since he was little.  5 years ago had similar symptoms.   They resolved on their own.   Has had double infections in the past.  Not sure about prior rupture.  Had disequilibrium at that time, 03/2016.  His hearing and ringing issue has been so bad he was hospitalized for delusions, and psychiatry put him on some new medications Ability and Wellbutrin.  He is taking those but the symptoms persist.  No prior ENT.   No hearing test recently.   He notes getting bilat knee and finger swelling and pain intermittent.  Gets swelling with red splotchy rash occasionally in the knees.   Has extreme fatigue at times.  He notes hx/o raynaud's phenomenon.       Past Medical History:  Diagnosis Date  . Anxiety   . Celiac disease   . History of substance abuse   . HIV infection ([place])   . Raynaud disease   . Seizures ([place])    Current Outpatient  Prescriptions on File Prior to Visit  Medication Sig Dispense Refill  . busPIRone (BUSPAR) 10 MG tablet Take 1 tablet (10 mg total) by mouth 3 (three) times daily. 90 tablet 2  . Cetirizine HCl (ZYRTEC ALLERGY) 10 MG CAPS Take 1 capsule (10 mg total) by mouth daily. 30 capsule   . TRIUMEQ 600-50-300 MG tablet TAKE 1 TABLET BY MOUTH ONCE EVERY DAY 90 tablet 2  . Vitamin D, Cholecalciferol, 1000 units CAPS Take 1 capsule by mouth daily. 90 capsule 2   No current facility-administered medications on file prior to visit.    Past Surgical History:  Procedure Laterality Date  . WISDOM TOOTH EXTRACTION      ROS as in subjective   Objective: BP 118/68   Pulse 84   Ht 6' (1.829 m)   Wt 147 lb (66.7 kg)   SpO2 98%   BMI 19.94 kg/m   General appearance: alert, no distress, WD/WN, lean white male Skin: there are a few individual pink/red somewhat small papular lesions 1-56m diameter of abdomen and right upper thigh, nonspecific, no other rash, no nail pitting Today there is no obvious joint swelling, joint tenderness or deformity. However, he showed a picture of his bilat knees when they were swollen with splotchy pink/red coloration throughout anterior knees, and he has a picture  of mildly swollen fingers, somewhat of a sausage appearance.   otherwise UE and LE unremarkable HEENT: normocephalic, sclerae anicteric, PERRLA, EOMi, nares patent, no discharge or erythema, pharynx normal Oral cavity: MMM, no lesions Neck: supple, no lymphadenopathy, no thyromegaly, no masses Heart: RRR, normal S1, S2, no murmurs Lungs: CTA bilaterally, no wheezes, rhonchi, or rales Extremities: no edema, no cyanosis, no clubbing Pulses: 2+ symmetric, upper and lower extremities, normal cap refill Neurological: alert, oriented x 3, CN2-12 intact, strength normal upper extremities and lower extremities, sensation normal throughout, DTRs 2+ throughout, no cerebellar signs, gait normal Psychiatric: normal affect,  behavior normal, pleasant     Assessment: Encounter Diagnoses  Name Primary?  . Other fatigue Yes  . Venereal disease contact   . Screen for STD (sexually transmitted disease)   . Bilateral hand swelling   . Human immunodeficiency virus (HIV) disease ([place])   . Anxiety and depression   . Celiac disease   . Polyarthralgia   . Raynaud's disease without gangrene   . Tinnitus of both ears     Plan: Tinnitus - referral to ENT  Screen for STD today  Given his ongoing issues with bilat joint pains, swelling, sausage fingers, and patchy red joints with joint swelling, will refer to rheumatology for consult.  reviewed recent labs in chart  Gave resources where he can establish with psychiatry here in [place]  He is managed by infectious disease for HIV  John Robbins was seen today for new patient (initial visit).  Diagnoses and all orders for this visit:  Other fatigue -     POCT Urinalysis DIP (Proadvantage Device) -     Ambulatory referral to Rheumatology  Venereal disease contact -     RPR -     GC/Chlamydia Probe Amp  Screen for STD (sexually transmitted disease) -     RPR -     GC/Chlamydia Probe Amp  Bilateral hand swelling -     Ambulatory referral to Rheumatology  Human immunodeficiency virus (HIV) disease ([place])  Anxiety and depression -     Ambulatory referral to ENT  Celiac disease -     Ambulatory referral to Rheumatology  Polyarthralgia -     Ambulatory referral to Rheumatology  Raynaud's disease without gangrene -     Ambulatory referral to Rheumatology  Tinnitus of both ears

## 2016-09-20 NOTE — Telephone Encounter (Signed)
Refer to ENT for tinnitus, possible hearing loss  Refer to [facility], preferably Dr. Trudie Reed or other for polyarthralgia, strange joint discoloration, raynaud's, possible autoimmune disease

## 2016-09-20 NOTE — Patient Instructions (Signed)
RESOURCES in [place], [place]  If you are experiencing a mental health crisis or an emergency, please call 911 or go to the nearest emergency department.  [facility]   [phone] [facility]  [phone] [facility] [address]   [phone]  Suicide Hotline 1-800-Suicide (209)205-3876)  National Suicide Prevention Lifeline 606-791-7621  9161707579)  Domestic Violence, Rape/Crisis - [facility] [place] 276-319-2647  The [employer] Violence Hotline 1-800-799-SAFE 303-649-5484)  To report Child or Elder Abuse, please call: [facility] Police Department  [phone] [facility] Department  [place] [phone]  Teen Crisis line 802-039-7016 or [phone]     Psychiatry and [place] [phone] [facility] [place], [place], [place] [address]   [facility] [phone] office [email or web address] [address] [place], [place] [address]   Dr. Chucky May, psychiatry [phone] [address], [place], [place] [address]

## 2016-09-20 NOTE — Telephone Encounter (Signed)
Patient called back reports that his short term disability papers were completed by Dr. Balinda Quails during May visit he was unable to return to work because he was still sick. [employer] needs a note filled out stating that he was unable to return to work and will be out of work from 09/14/2016 10/08/2016 please fax note to [employer] [phone] please include claim number [id number] leave number [id number] need as soon as possible. I advised patient that I did not see a copy of paper work in his chart asked if he had any paper work that he needed completed he reports he only needs a note.

## 2016-09-20 NOTE — Telephone Encounter (Signed)
I will be in clinic tomorrow and will take care of it. Thanks.

## 2016-09-21 ENCOUNTER — Encounter: Payer: Self-pay | Admitting: Internal Medicine

## 2016-09-21 LAB — GC/CHLAMYDIA PROBE AMP
CT PROBE, AMP APTIMA: NOT DETECTED
GC PROBE AMP APTIMA: NOT DETECTED

## 2016-09-21 LAB — RPR

## 2016-09-21 NOTE — Telephone Encounter (Signed)
Discussed with John Robbins.  Patient has transferred care to another provider and should seek work note from that provider.  If Dr. Charlynn Grimes, who last saw patient, is comfortable filling out a work note he may do so.

## 2016-09-21 NOTE — Telephone Encounter (Signed)
I can't send referral until you close the note.

## 2016-09-22 ENCOUNTER — Telehealth: Payer: Self-pay

## 2016-09-22 ENCOUNTER — Encounter: Payer: Self-pay | Admitting: Internal Medicine

## 2016-09-22 NOTE — Telephone Encounter (Signed)
Thanks for the reminder. I just did it.

## 2016-09-28 ENCOUNTER — Other Ambulatory Visit: Payer: Self-pay

## 2016-09-28 DIAGNOSIS — H9313 Tinnitus, bilateral: Secondary | ICD-10-CM

## 2016-10-05 ENCOUNTER — Telehealth: Payer: Self-pay | Admitting: Medical

## 2016-10-05 NOTE — Telephone Encounter (Signed)
Pt is going to give himself the injection. He has friend that is nurse that is going  Help with something

## 2016-10-05 NOTE — Telephone Encounter (Signed)
Pt called & states he couldn't get in with psychiatry until middle of August and is due for Abilify injection first of July and wants to know if he can come here and get it.  He already has the prescription for it he just needs it administered an intramuscular injection.  He was given it last in the hospital.  Is this ok?

## 2016-10-05 NOTE — Telephone Encounter (Signed)
If he has script and just needs it administered, then yes can come in for this.    Any reason why they didn't put him on the oral form of Abilify?

## 2016-10-21 DIAGNOSIS — H9313 Tinnitus, bilateral: Secondary | ICD-10-CM | POA: Diagnosis not present

## 2016-10-21 DIAGNOSIS — H9012 Conductive hearing loss, unilateral, left ear, with unrestricted hearing on the contralateral side: Secondary | ICD-10-CM | POA: Diagnosis not present

## 2016-10-21 DIAGNOSIS — J343 Hypertrophy of nasal turbinates: Secondary | ICD-10-CM | POA: Diagnosis not present

## 2016-11-04 DIAGNOSIS — H9012 Conductive hearing loss, unilateral, left ear, with unrestricted hearing on the contralateral side: Secondary | ICD-10-CM | POA: Diagnosis not present

## 2016-11-04 DIAGNOSIS — H9042 Sensorineural hearing loss, unilateral, left ear, with unrestricted hearing on the contralateral side: Secondary | ICD-10-CM | POA: Diagnosis not present

## 2016-11-04 DIAGNOSIS — H8092 Unspecified otosclerosis, left ear: Secondary | ICD-10-CM | POA: Diagnosis not present

## 2016-11-09 HISTORY — PX: COLONOSCOPY WITH ESOPHAGOGASTRODUODENOSCOPY (EGD): SHX5779

## 2016-11-17 ENCOUNTER — Encounter (HOSPITAL_COMMUNITY): Payer: Self-pay | Admitting: Psychiatry

## 2016-11-17 ENCOUNTER — Ambulatory Visit (INDEPENDENT_AMBULATORY_CARE_PROVIDER_SITE_OTHER): Payer: 59 | Admitting: Psychiatry

## 2016-11-17 VITALS — BP 148/100 | HR 60 | Resp 18 | Ht 71.5 in | Wt 155.0 lb

## 2016-11-17 DIAGNOSIS — B20 Human immunodeficiency virus [HIV] disease: Secondary | ICD-10-CM

## 2016-11-17 DIAGNOSIS — G4700 Insomnia, unspecified: Secondary | ICD-10-CM | POA: Diagnosis not present

## 2016-11-17 DIAGNOSIS — F411 Generalized anxiety disorder: Secondary | ICD-10-CM

## 2016-11-17 DIAGNOSIS — K5900 Constipation, unspecified: Secondary | ICD-10-CM | POA: Diagnosis not present

## 2016-11-17 DIAGNOSIS — G2571 Drug induced akathisia: Secondary | ICD-10-CM | POA: Diagnosis not present

## 2016-11-17 DIAGNOSIS — R569 Unspecified convulsions: Secondary | ICD-10-CM

## 2016-11-17 DIAGNOSIS — F332 Major depressive disorder, recurrent severe without psychotic features: Secondary | ICD-10-CM

## 2016-11-17 DIAGNOSIS — M1990 Unspecified osteoarthritis, unspecified site: Secondary | ICD-10-CM | POA: Diagnosis not present

## 2016-11-17 DIAGNOSIS — F4010 Social phobia, unspecified: Secondary | ICD-10-CM | POA: Diagnosis not present

## 2016-11-17 MED ORDER — GUANFACINE HCL 1 MG PO TABS: 1.0000 mg | ORAL_TABLET | Freq: Every day | ORAL | 1 refills | Status: DC

## 2016-11-17 MED ORDER — GUANFACINE HCL 1 MG PO TABS: 1.0000 mg | ORAL_TABLET | Freq: Three times a day (TID) | ORAL | 1 refills | Status: DC

## 2016-11-17 NOTE — Addendum Note (Signed)
Addended by: Lulu Riding A on: 11/17/2016 01:07 PM   Modules accepted: Orders
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
## 2016-11-23 ENCOUNTER — Ambulatory Visit (AMBULATORY_SURGERY_CENTER): Payer: 59 | Admitting: Gastroenterology

## 2016-11-23 ENCOUNTER — Encounter: Payer: Self-pay | Admitting: Gastroenterology

## 2016-11-23 VITALS — BP 109/71 | HR 52 | Temp 98.2°F | Resp 15 | Ht 71.0 in | Wt 155.0 lb

## 2016-11-23 DIAGNOSIS — D124 Benign neoplasm of descending colon: Secondary | ICD-10-CM

## 2016-11-23 DIAGNOSIS — K900 Celiac disease: Secondary | ICD-10-CM

## 2016-11-23 DIAGNOSIS — K5900 Constipation, unspecified: Secondary | ICD-10-CM | POA: Diagnosis present

## 2016-11-23 DIAGNOSIS — K2990 Gastroduodenitis, unspecified, without bleeding: Secondary | ICD-10-CM | POA: Diagnosis not present

## 2016-11-23 DIAGNOSIS — R1310 Dysphagia, unspecified: Secondary | ICD-10-CM | POA: Diagnosis not present

## 2016-11-23 DIAGNOSIS — K2970 Gastritis, unspecified, without bleeding: Secondary | ICD-10-CM | POA: Diagnosis not present

## 2016-11-23 DIAGNOSIS — R1319 Other dysphagia: Secondary | ICD-10-CM

## 2016-11-23 MED ORDER — SODIUM CHLORIDE 0.9 % IV SOLN: 500.0000 mL | INTRAVENOUS | Status: DC

## 2016-11-23 NOTE — Op Note (Signed)
[facility] Patient Name: John Robbins Procedure Date: 11/23/2016 9:51 AM MRN: [id number] Endoscopist: Milus Banister , MD Age: 41 Referring MD:  Date of Birth: 1975-06-06 Gender: Male Account #: [id number] Procedure:                Upper GI endoscopy Indications:              Dysphagia, weight loss, h/o Celiac sprue diagnosed                            15 years ago (elsewhere), HIV + Medicines:                Monitored Anesthesia Care Procedure:                Pre-Anesthesia Assessment:                           - Prior to the procedure, a History and Physical                            was performed, and patient medications and                            allergies were reviewed. The patient's tolerance of                            previous anesthesia was also reviewed. The risks                            and benefits of the procedure and the sedation                            options and risks were discussed with the patient.                            All questions were answered, and informed consent                            was obtained. Prior Anticoagulants: The patient has                            taken no previous anticoagulant or antiplatelet                            agents. ASA Grade Assessment: II - A patient with                            mild systemic disease. After reviewing the risks                            and benefits, the patient was deemed in                            satisfactory condition to undergo the procedure.  After obtaining informed consent, the endoscope was                            passed under direct vision. Throughout the                            procedure, the patient's blood pressure, pulse, and                            oxygen saturations were monitored continuously. The                            Endoscope was introduced through the mouth, and                            advanced to the second  part of duodenum. The upper                            GI endoscopy was accomplished without difficulty.                            The patient tolerated the procedure well. Scope In: Scope Out: Findings:                 The examined duodenum was normal. Biopsies were                            taken with a cold forceps for histology given                            history of celiac sprue.                           Mild inflammation characterized by erythema was                            found in the gastric antrum. Biopsies were taken                            with a cold forceps for histology to check for H.                            pylori.                           The examined esophagus was normal. GE junction was                            normal. Biopsies were obtained from the proximal                            and distal esophagus with cold forceps for                            histology  to check for eosinophilic esophagitis                            (not currently on PPI). Complications:            No immediate complications. Estimated blood loss:                            None. Estimated Blood Loss:     Estimated blood loss: none. Impression:               - Mild gastritis, otherwise normal examination.                           - Duodenum, stomach and esophagus were all                            biopsied. See above. Recommendation:           - Patient has a contact number available for                            emergencies. The signs and symptoms of potential                            delayed complications were discussed with the                            patient. Return to normal activities tomorrow.                            Written discharge instructions were provided to the                            patient.                           - Resume previous diet.                           - Continue present medications.                           - Await  pathology results. Milus Banister, MD 11/23/2016 10:29:26 AM This report has been signed electronically.

## 2016-11-23 NOTE — Progress Notes (Signed)
To recovery, report to Owens Shark, Therapist, sports, [facility]

## 2016-11-23 NOTE — Patient Instructions (Signed)
YOU HAD AN ENDOSCOPIC PROCEDURE TODAY AT [facility]:   Refer to the procedure report that was given to you for any specific questions about what was found during the examination.  If the procedure report does not answer your questions, please call your gastroenterologist to clarify.  If you requested that your care partner not be given the details of your procedure findings, then the procedure report has been included in a sealed envelope for you to review at your convenience later.  YOU SHOULD EXPECT: Some feelings of bloating in the abdomen. Passage of more gas than usual.  Walking can help get rid of the air that was put into your GI tract during the procedure and reduce the bloating. If you had a lower endoscopy (such as a colonoscopy or flexible sigmoidoscopy) you may notice spotting of blood in your stool or on the toilet paper. If you underwent a bowel prep for your procedure, you may not have a normal bowel movement for a few days.  Please Note:  You might notice some irritation and congestion in your nose or some drainage.  This is from the oxygen used during your procedure.  There is no need for concern and it should clear up in a day or so.  SYMPTOMS TO REPORT IMMEDIATELY:   Following lower endoscopy (colonoscopy or flexible sigmoidoscopy):  Excessive amounts of blood in the stool  Significant tenderness or worsening of abdominal pains  Swelling of the abdomen that is new, acute  Fever of 100F or higher   Following upper endoscopy (EGD)  Vomiting of blood or coffee ground material  New chest pain or pain under the shoulder blades  Painful or persistently difficult swallowing  New shortness of breath  Fever of 100F or higher  Black, tarry-looking stools  For urgent or emergent issues, a gastroenterologist can be reached at any hour by calling [phone].   DIET:  We do recommend a small meal at first, but then you may proceed to your regular diet.  Drink  plenty of fluids but you should avoid alcoholic beverages for 24 hours.  ACTIVITY:  You should plan to take it easy for the rest of today and you should NOT DRIVE or use heavy machinery until tomorrow (because of the sedation medicines used during the test).    FOLLOW UP: Our staff will call the number listed on your records the next business day following your procedure to check on you and address any questions or concerns that you may have regarding the information given to you following your procedure. If we do not reach you, we will leave a message.  However, if you are feeling well and you are not experiencing any problems, there is no need to return our call.  We will assume that you have returned to your regular daily activities without incident.  If any biopsies were taken you will be contacted by phone or by letter within the next 1-3 weeks.  Please call us at [phone] if you have not heard about the biopsies in 3 weeks.    SIGNATURES/CONFIDENTIALITY: You and/or your care partner have signed paperwork which will be entered into your electronic medical record.  These signatures attest to the fact that that the information above on your After Visit Summary has been reviewed and is understood.  Full responsibility of the confidentiality of this discharge information lies with you and/or your care-partner.   Stop Miralax and start Over the counter citrucel fiber supplement  daily,resume remainder of medication. Information given on polyps and gastritis.Notify Dr. Ardis Hughs in 3-4 weeks with response.

## 2016-11-23 NOTE — Progress Notes (Signed)
Called to room to assist during endoscopic procedure.  Patient ID and intended procedure confirmed with present staff. Received instructions for my participation in the procedure from the performing physician.  

## 2016-11-23 NOTE — Op Note (Signed)
[place] Patient Name: John Robbins Procedure Date: 11/23/2016 9:51 AM MRN: [id number] Endoscopist: Milus Banister , MD Age: 41 Referring MD:  Date of Birth: Dec 11, 1975 Gender: Male Account #: [id number] Procedure:                Colonoscopy Indications:              Constipation Medicines:                Monitored Anesthesia Care Procedure:                Pre-Anesthesia Assessment:                           - Prior to the procedure, a History and Physical                            was performed, and patient medications and                            allergies were reviewed. The patient's tolerance of                            previous anesthesia was also reviewed. The risks                            and benefits of the procedure and the sedation                            options and risks were discussed with the patient.                            All questions were answered, and informed consent                            was obtained. Prior Anticoagulants: The patient has                            taken no previous anticoagulant or antiplatelet                            agents. ASA Grade Assessment: II - A patient with                            mild systemic disease. After reviewing the risks                            and benefits, the patient was deemed in                            satisfactory condition to undergo the procedure.                           After obtaining informed consent, the colonoscope  was passed under direct vision. Throughout the                            procedure, the patient's blood pressure, pulse, and                            oxygen saturations were monitored continuously. The                            Colonoscope was introduced through the anus and                            advanced to the the cecum, identified by                            appendiceal orifice and ileocecal valve. The                colonoscopy was performed without difficulty. The                            patient tolerated the procedure well. The quality                            of the bowel preparation was good. The ileocecal                            valve, appendiceal orifice, and rectum were                            photographed. Scope In: 10:02:56 AM Scope Out: 10:12:14 AM Scope Withdrawal Time: 0 hours 6 minutes 32 seconds  Total Procedure Duration: 0 hours 9 minutes 18 seconds  Findings:                 A 4 mm polyp was found in the descending colon. The                            polyp was sessile. The polyp was removed with a                            cold snare. Resection and retrieval were complete.                           The exam was otherwise without abnormality on                            direct and retroflexion views. Complications:            No immediate complications. Estimated blood loss:                            None. Estimated Blood Loss:     Estimated blood loss: none. Impression:               - One 4 mm polyp in the descending colon, removed  with a cold snare. Resected and retrieved.                           - The examination was otherwise normal on direct                            and retroflexion views. Recommendation:           - Patient has a contact number available for                            emergencies. The signs and symptoms of potential                            delayed complications were discussed with the                            patient. Return to normal activities tomorrow.                            Written discharge instructions were provided to the                            patient.                           - Resume previous diet.                           - Continue present medications.                           You will receive a letter within 2-3 weeks with the                            pathology results  and my final recommendations.                           If the polyp(s) is proven to be 'pre-cancerous' on                            pathology, you will need repeat colonoscopy in 5                            years. If the polyp(s) is NOT 'precancerous' on                            pathology then you should repeat colon cancer                            screening in 10 years with colonoscopy without need                            for colon cancer screening by any method prior to  then (including stool testing).                           Please stop daily miralax and instead start once                            daily OTC powder (citrucel) fiber supplement, stay                            hydrated. Call Dr. Ardis Hughs' office in 3-4 weeks to                            report on your response. Milus Banister, MD 11/23/2016 10:15:51 AM This report has been signed electronically.

## 2016-11-24 ENCOUNTER — Telehealth: Payer: Self-pay | Admitting: *Deleted

## 2016-11-24 NOTE — Telephone Encounter (Signed)
  Follow up Call-  Call back number 11/23/2016  Post procedure Call Back phone  # [phone]  Permission to leave phone message Yes  Some recent data might be hidden     No answer at # given.  Left message on VM.

## 2016-11-30 ENCOUNTER — Other Ambulatory Visit (HOSPITAL_COMMUNITY): Payer: Self-pay | Admitting: Psychiatry

## 2016-11-30 ENCOUNTER — Encounter (HOSPITAL_COMMUNITY): Payer: Self-pay | Admitting: Psychiatry

## 2016-11-30 DIAGNOSIS — F5104 Psychophysiologic insomnia: Secondary | ICD-10-CM

## 2016-11-30 MED ORDER — TRAZODONE HCL 50 MG PO TABS: ORAL_TABLET | ORAL | 2 refills | Status: DC

## 2016-12-01 ENCOUNTER — Other Ambulatory Visit: Payer: 59

## 2016-12-01 DIAGNOSIS — B20 Human immunodeficiency virus [HIV] disease: Secondary | ICD-10-CM

## 2016-12-01 DIAGNOSIS — Z113 Encounter for screening for infections with a predominantly sexual mode of transmission: Secondary | ICD-10-CM

## 2016-12-01 LAB — CBC WITH DIFFERENTIAL/PLATELET
BASOS ABS: 0 {cells}/uL (ref 0–200)
Basophils Relative: 0 %
EOS ABS: 46 {cells}/uL (ref 15–500)
EOS PCT: 1 %
HCT: 48.3 % (ref 38.5–50.0)
HEMOGLOBIN: 16.4 g/dL (ref 13.2–17.1)
LYMPHS ABS: 2024 {cells}/uL (ref 850–3900)
Lymphocytes Relative: 44 %
MCH: 30.7 pg (ref 27.0–33.0)
MCHC: 34 g/dL (ref 32.0–36.0)
MCV: 90.3 fL (ref 80.0–100.0)
MONO ABS: 322 {cells}/uL (ref 200–950)
MPV: 11.3 fL (ref 7.5–12.5)
Monocytes Relative: 7 %
NEUTROS ABS: 2208 {cells}/uL (ref 1500–7800)
Neutrophils Relative %: 48 %
Platelets: 189 10*3/uL (ref 140–400)
RBC: 5.35 MIL/uL (ref 4.20–5.80)
RDW: 13.5 % (ref 11.0–15.0)
WBC: 4.6 10*3/uL (ref 3.8–10.8)

## 2016-12-01 LAB — COMPLETE METABOLIC PANEL WITH GFR
ALT: 20 U/L (ref 9–46)
AST: 18 U/L (ref 10–40)
Albumin: 4.3 g/dL (ref 3.6–5.1)
Alkaline Phosphatase: 50 U/L (ref 40–115)
BUN: 11 mg/dL (ref 7–25)
CO2: 27 mmol/L (ref 20–32)
Calcium: 9 mg/dL (ref 8.6–10.3)
Chloride: 104 mmol/L (ref 98–110)
Creat: 1.17 mg/dL (ref 0.60–1.35)
GFR, EST NON AFRICAN AMERICAN: 78 mL/min (ref >=60)
GFR, Est African American: >89 mL/min (ref >=60)
GLUCOSE: 79 mg/dL (ref 65–99)
POTASSIUM: 4.3 mmol/L (ref 3.5–5.3)
SODIUM: 139 mmol/L (ref 135–146)
Total Bilirubin: 0.6 mg/dL (ref 0.2–1.2)
Total Protein: 6.2 g/dL (ref 6.1–8.1)

## 2016-12-02 LAB — RPR

## 2016-12-02 LAB — T-HELPER CELL (CD4) - (RCID CLINIC ONLY)
CD4 % Helper T Cell: 28 % — ABNORMAL LOW (ref 33–55)
CD4 T CELL ABS: 600 /uL (ref 400–2700)

## 2016-12-06 LAB — HIV-1 RNA QUANT-NO REFLEX-BLD
HIV 1 RNA QUANT: NOT DETECTED {copies}/mL
HIV-1 RNA Quant, Log: <1.3 Log copies/mL

## 2016-12-08 ENCOUNTER — Ambulatory Visit (INDEPENDENT_AMBULATORY_CARE_PROVIDER_SITE_OTHER): Payer: 59 | Admitting: Internal Medicine

## 2016-12-08 ENCOUNTER — Encounter: Payer: Self-pay | Admitting: Internal Medicine

## 2016-12-08 VITALS — BP 118/80 | HR 49 | Temp 97.7°F | Ht 73.0 in | Wt 157.0 lb

## 2016-12-08 DIAGNOSIS — R21 Rash and other nonspecific skin eruption: Secondary | ICD-10-CM | POA: Diagnosis not present

## 2016-12-08 DIAGNOSIS — B20 Human immunodeficiency virus [HIV] disease: Secondary | ICD-10-CM

## 2016-12-08 DIAGNOSIS — Z113 Encounter for screening for infections with a predominantly sexual mode of transmission: Secondary | ICD-10-CM | POA: Diagnosis not present

## 2016-12-08 MED ORDER — FLUCONAZOLE 200 MG PO TABS: 200.0000 mg | ORAL_TABLET | Freq: Every day | ORAL | 2 refills | Status: DC

## 2016-12-08 NOTE — Assessment & Plan Note (Signed)
Screened negative 

## 2016-12-08 NOTE — Progress Notes (Signed)
   Subjective:    Patient ID: John Robbins, male    DOB: 07-07-1975, 41 y.o.   MRN: [id number]  HPI Here for follow up of HIV> Has been on Triumeq with no missed doses.  No associated n/v/d.  No rashes.  CD4 of 600, viral load < 20.  Since last visit he had an episode of psychosis and was in an inpatient facility in [place].  Now feels it is resolved, stable.  Seeing psychiatry here.  No SI.  Now on guanfacine.  Gets occasional rash on torso and arms that generally responds to OTC antifungal.     Review of Systems  Constitutional: Negative for fatigue.  Gastrointestinal: Positive for constipation.  Skin: Positive for rash.  Neurological: Negative for dizziness.  Psychiatric/Behavioral: The patient is not hyperactive.        Objective:   Physical Exam  Constitutional: He appears well-developed and well-nourished. No distress.  HENT:  Mouth/Throat: No oropharyngeal exudate.  Eyes: No scleral icterus.  Cardiovascular: Normal rate, regular rhythm and normal heart sounds.   No murmur heard. Pulmonary/Chest: Effort normal and breath sounds normal.  Lymphadenopathy:    He has no cervical adenopathy.  Skin: No rash noted.   SH: no tobacco       Assessment & Plan:

## 2016-12-08 NOTE — Assessment & Plan Note (Signed)
Doing well on regimen.  No changes and rtc 4 months.

## 2016-12-08 NOTE — Assessment & Plan Note (Signed)
Looks c/w tinea.  Will give fluconazole to help alleviate.

## 2016-12-09 ENCOUNTER — Encounter: Payer: Self-pay | Admitting: Internal Medicine

## 2016-12-27 ENCOUNTER — Other Ambulatory Visit (HOSPITAL_COMMUNITY): Payer: Self-pay

## 2016-12-27 ENCOUNTER — Encounter (HOSPITAL_COMMUNITY): Payer: Self-pay | Admitting: Psychiatry

## 2016-12-27 ENCOUNTER — Telehealth (HOSPITAL_COMMUNITY): Payer: Self-pay

## 2016-12-27 MED ORDER — ARIPIPRAZOLE 5 MG PO TABS: ORAL_TABLET | ORAL | 2 refills | Status: DC

## 2016-12-27 NOTE — Progress Notes (Signed)
Last time this happened with him, he was using multiple drugs fairly heavily.  I agree with the Abilify plan, but lets also get a urine drug screen on him.  Thank you Dr. Lovena Le and [place]!

## 2016-12-27 NOTE — Progress Notes (Unsigned)
Patient called wanting to come in and see Dr. Daron Offer on an emergency basis. Patient states that he is hearing voices and it is "driving him crazy". Patient is not having any visual hallucinations, he is not homicidal or suicidal. I talked to Dr. Lovena Le and given patients history of taking Abilify injections, Dr. Lovena Le had this writer send in Abilify 5 mg for one week and then increase to 10 mg. I will try to get the patient in next week with Dr. Daron Offer. I called patient and he is agreeable to this plan.

## 2016-12-27 NOTE — Telephone Encounter (Signed)
Pharmacy called, patient needs a PA for Abilify 5 mg bid. Will submit on Covermymeds today.

## 2016-12-29 ENCOUNTER — Telehealth (HOSPITAL_COMMUNITY): Payer: Self-pay | Admitting: *Deleted

## 2017-01-05 ENCOUNTER — Encounter (HOSPITAL_COMMUNITY): Payer: Self-pay | Admitting: Psychiatry

## 2017-01-05 ENCOUNTER — Ambulatory Visit (INDEPENDENT_AMBULATORY_CARE_PROVIDER_SITE_OTHER): Payer: 59 | Admitting: Psychiatry

## 2017-01-05 VITALS — BP 120/62 | HR 59 | Ht 73.0 in | Wt 148.4 lb

## 2017-01-05 DIAGNOSIS — R440 Auditory hallucinations: Secondary | ICD-10-CM | POA: Diagnosis not present

## 2017-01-05 DIAGNOSIS — F29 Unspecified psychosis not due to a substance or known physiological condition: Secondary | ICD-10-CM | POA: Diagnosis not present

## 2017-01-05 DIAGNOSIS — F23 Brief psychotic disorder: Secondary | ICD-10-CM

## 2017-01-05 DIAGNOSIS — G2571 Drug induced akathisia: Secondary | ICD-10-CM | POA: Diagnosis not present

## 2017-01-05 DIAGNOSIS — R450 Nervousness: Secondary | ICD-10-CM

## 2017-01-05 DIAGNOSIS — F411 Generalized anxiety disorder: Secondary | ICD-10-CM | POA: Diagnosis not present

## 2017-01-05 DIAGNOSIS — F5104 Psychophysiologic insomnia: Secondary | ICD-10-CM

## 2017-01-05 DIAGNOSIS — B20 Human immunodeficiency virus [HIV] disease: Secondary | ICD-10-CM | POA: Diagnosis not present

## 2017-01-05 MED ORDER — GUANFACINE HCL 1 MG PO TABS: 1.0000 mg | ORAL_TABLET | Freq: Three times a day (TID) | ORAL | 1 refills | Status: DC

## 2017-01-05 MED ORDER — LURASIDONE HCL 20 MG PO TABS: 20.0000 mg | ORAL_TABLET | Freq: Every day | ORAL | 1 refills | Status: DC

## 2017-01-05 MED ORDER — TRAZODONE HCL 50 MG PO TABS: 50.0000 mg | ORAL_TABLET | Freq: Every day | ORAL | 1 refills | Status: DC

## 2017-01-05 NOTE — Progress Notes (Signed)
[facility] MD/PA/NP OP Progress Note  01/05/2017 10:03 AM John Robbins  MRN:  [id number]  Chief Complaint: anxiety  HPI: John Robbins presents for med management follow-up after initial intake. He has had a recent increase in psychotic symptoms, he describes auditory hallucinations describing his activities throughout the day, making critical and negative remarks towards him, or making judgmental remarks when he is doing activities such as using the restroom or speaking with other people. He reports that he noticed that this came on after a week when he was eating more gluten, and wonders if this could be related to celiac disease. He reports that he has been adherent to his antiretroviral regimen, and as far as he knows has not had any change in his HIV status or viral load. He reports that he has also felt paranoid recently, and felt like his neighbors, or his employees, or even possibly government is spying on him. He reports that in part he recognizes that this sounds "crazy" and does not believe that this is happening, but reports when the voices and paranoia, on, it's hard to tell the difference between his delusions and reality.  He retried Abilify 5 mg which was recently sent in and reports he was only able to tolerate it for 2 days before he had the onset of akathisia and restlessness and increased anxiety. He reports that during these episodes of paranoia and auditory hallucinations, he feels more angry, irritable, anxious. He reports that he is able to sleep with trazodone, generally about 5 hours. He reports that he does not have any suicidal thoughts, or any intention to harm himself. He reports that he mostly feels very angry with the voices and confused with what's happening.  I spent time with the patient discussing my concerns about HIV related brain changes, we discussed HIV psychosis which is often associated with HIV dementia. I expressed that it is very likely that his MRI of his brain  would be normal, but I would like to obtain imaging to rule out any acute neurological changes given that his mood and psychotic symptoms only recently started over the past 6 months. His CD4 count has been less than 600 over the past year, which may be predisposing factor to neuropsychiatric manifestations of HIV.    He denies any acute stressors or changes in his life recently besides these symptoms. He reports that he continues to be able to work, but has difficulty with attention during these periods. He denies any visual hallucinations. He does not present as grossly disorganized and is quite coherent and goal-directed throughout our interaction. He does not appear to respond to internal stimuli at any point during our interaction.  Visit Diagnosis:    ICD-10-CM   1. Psychosis, unspecified psychosis type ([place]) F29 lurasidone (LATUDA) 20 MG TABS tablet  2. Generalized anxiety disorder F41.1 guanFACINE (TENEX) 1 MG tablet  3. Drug induced akathisia G25.71 guanFACINE (TENEX) 1 MG tablet  4. Psychophysiological insomnia F51.04 traZODone (DESYREL) 50 MG tablet  5. Brief psychotic disorder [facility]) F23 MR BRAIN W WO CONTRAST    Past Psychiatric History: Prior psychiatric hospitalization for substance-induced psychosis versus mania  Past Medical History:  Past Medical History:  Diagnosis Date  . Allergy   . Anxiety   . Celiac disease   . History of substance abuse   . HIV infection ([place])   . Raynaud disease   . Seizures ([place])     Past Surgical History:  Procedure Laterality Date  .  WISDOM TOOTH EXTRACTION      Family Psychiatric History: Family history reviewed, no updates  Family History:  Family History  Problem Relation Age of Onset  . Heart disease Father 39  . Hypertension Father   . Heart disease Maternal Grandfather        early 50s  . Hypertension Maternal Grandfather   . Hypertension Mother   . Rheum arthritis Mother   . Crohn's disease Mother   . Crohn's disease  Sister   . Rheum arthritis Maternal Grandmother   . Breast cancer Maternal Grandmother   . Colon cancer Paternal Grandmother   . Hypertension Paternal Grandfather   . Esophageal cancer Neg Hx   . Stomach cancer Neg Hx     Social History:  Social History   Social History  . Marital status: Unknown    Spouse name: N/A  . Number of children: N/A  . Years of education: N/A   Social History Main Topics  . Smoking status: Never Smoker  . Smokeless tobacco: Never Used  . Alcohol use 1.2 oz/week    2 Glasses of wine per week     Comment: occ  . Drug use: No     Comment: Hx of maijuana, ecstacy, GHB, ketamine, amephetamines, cocaine - nothing since 2009  . Sexual activity: Yes    Partners: Male     Comment: Uses Condoms   Other Topics Concern  . None   Social History Narrative   Works in Engineer, technical sales.    Allergies:  Allergies  Allergen Reactions  . Gluten Meal Other (See Comments)    Neurological    Metabolic Disorder Labs: Lab Results  Component Value Date   HGBA1C 5.3 07/28/2016   No results found for: PROLACTIN Lab Results  Component Value Date   CHOL 183 11/30/2015   TRIG 151 (H) 11/30/2015   HDL 57 11/30/2015   CHOLHDL 3.2 11/30/2015   VLDL 30 11/30/2015   LDLCALC 96 11/30/2015   Lab Results  Component Value Date   TSH 3.330 08/05/2016   TSH 0.986 07/28/2016    Therapeutic Level Labs: No results found for: LITHIUM No results found for: VALPROATE No components found for:  CBMZ  Current Medications: Current Outpatient Prescriptions  Medication Sig Dispense Refill  . Cetirizine HCl (ZYRTEC ALLERGY) 10 MG CAPS Take 1 capsule (10 mg total) by mouth daily. 30 capsule   . fluconazole (DIFLUCAN) 200 MG tablet Take 1 tablet (200 mg total) by mouth daily. 5 tablet 2  . guanFACINE (TENEX) 1 MG tablet Take 1 tablet (1 mg total) by mouth 3 (three) times daily. 270 tablet 1  . traZODone (DESYREL) 50 MG tablet Take 1 tablet (50 mg total) by mouth at bedtime. 90 tablet 1   . TRIUMEQ 600-50-300 MG tablet TAKE 1 TABLET BY MOUTH ONCE EVERY DAY 90 tablet 2  . Vitamin D, Cholecalciferol, 1000 units CAPS Take 1 capsule by mouth daily. 90 capsule 2  . lurasidone (LATUDA) 20 MG TABS tablet Take 1 tablet (20 mg total) by mouth daily. 90 tablet 1   Current Facility-Administered Medications  Medication Dose Route Frequency Provider Last Rate Last Dose  . 0.9 %  sodium chloride infusion  500 mL Intravenous Continuous Milus Banister, MD         Musculoskeletal: Strength & Muscle Tone: within normal limits Gait & Station: normal Patient leans: N/A  Psychiatric Specialty Exam: Review of Systems  Constitutional: Negative.   HENT: Negative.   Respiratory: Negative.  Cardiovascular: Negative.   Gastrointestinal: Negative.   Musculoskeletal: Negative.   Neurological: Negative.   Psychiatric/Behavioral: Positive for hallucinations. The patient is nervous/anxious.     Blood pressure 120/62, pulse (!) 59, height 6' 1"  (1.854 m), weight 148 lb 6.4 oz (67.3 kg).Body mass index is 19.58 kg/m.  General Appearance: Casual and Fairly Groomed  Eye Contact:  Fair  Speech:  Clear and Coherent  Volume:  Normal  Mood:  Anxious  Affect:  Congruent  Thought Process:  Coherent and Goal Directed  Orientation:  Full (Time, Place, and Person)  Thought Content: Hallucinations: Auditory and Ideas of Reference:   Paranoia Delusions   Suicidal Thoughts:  No  Homicidal Thoughts:  No  Memory:  Immediate;   Fair  Judgement:  Fair  Insight:  Fair  Psychomotor Activity:  Normal  Concentration:  Attention Span: Fair  Recall:  Good  Fund of Knowledge: Good  Language: Fair  Akathisia:  Negative  Handed:  Right  AIMS (if indicated): not done  Assets:  Communication Skills Desire for Improvement Financial Resources/Insurance Housing Social Support Transportation Vocational/Educational  ADL's:  Intact  Cognition: WNL  Sleep:  Good   Screenings: PHQ2-9     Office Visit  from 12/08/2016 in [facility] for [facility] Office Visit from 09/20/2016 in [place] Visit from 08/26/2016 in [place] Office Visit from 08/05/2016 in [place] Office Visit from 07/28/2016 in [place]  PHQ-2 Total Score  0  0  2  0  2  PHQ-9 Total Score  -  -  12  -  8       Assessment and Plan: John Robbins is a 41 year old male, HIV positive, with a history of substance abuse and mood lability, prior psychiatric hospitalization for psychosis who presents today for his second visit after initial intake. He presents today with ongoing symptoms of auditory hallucinations and paranoia, but is not grossly disorganized or responding to internal stimuli. It's unclear if there was any sort of external stressor contributing to his worsening symptoms, he attributes increased gluten exposure as he was traveling and eating out at restaurants where he was not able to be adherent to his celiac diet. He denies any acute safety issues or suicidality. He is distressed by his auditory hallucinations and paranoia, and has increased anxiety and difficulty with attention during these periods. He does not appear manic. Given his medical comorbidities, and at the fairly recent onset of his mood and psychotic symptoms over the past 6 months, I believe he merits imaging with MRI of the brain. His CD4 count has been less than 600 over the past year, predisposing him to neuropsychiatric manifestations of HIV. We will proceed with a presumed diagnosis of brief psychotic disorder or psychosis unspecified and initiate Latuda. He has not been able to tolerate Abilify due to akathisia. If his symptoms worsen or failed to respond, I would consider psychiatric hospitalization.  1. Psychosis, unspecified psychosis type ([place])   2. Generalized anxiety disorder   3. Drug induced akathisia   4. Psychophysiological  insomnia   5. Brief psychotic disorder ([place])    MRI of the brain with and without contrast; outpatient imaging with [facility] Continue Tenex 1 mg 3 times daily, as he does report benefit in reducing anxiety and restlessness Initiate Latuda 20 mg daily Return to clinic in 4-6 weeks Continue trazodone 50 mg nightly for sleep Discontinue Abilify given  akathisia  Aundra Dubin, MD 01/05/2017, 10:03 AM

## 2017-01-06 ENCOUNTER — Encounter: Payer: Self-pay | Admitting: Gastroenterology

## 2017-01-06 ENCOUNTER — Ambulatory Visit (INDEPENDENT_AMBULATORY_CARE_PROVIDER_SITE_OTHER): Payer: 59 | Admitting: Gastroenterology

## 2017-01-06 VITALS — BP 140/80 | HR 76 | Ht 71.0 in | Wt 146.2 lb

## 2017-01-06 DIAGNOSIS — K5900 Constipation, unspecified: Secondary | ICD-10-CM

## 2017-01-06 DIAGNOSIS — K900 Celiac disease: Secondary | ICD-10-CM | POA: Diagnosis not present

## 2017-01-06 NOTE — Patient Instructions (Signed)
Call if you have any new, concerning GI issues.

## 2017-01-06 NOTE — Progress Notes (Signed)
Review of pertinent gastrointestinal problems: 1. Celiac Sprue: diagnosed in [place], EGD 2013 elsewhere path described "incomplete healing of sprue."  Pretty strict about gluten free diet: EGD 11/2016 Dr. Ardis Hughs: normal duodenum (path normal), mild gastritis (path normal), esophagus was normal (path normal, no EoE). 2. Dysphagia: EGD 11/2016 normal esophagus, biopsies no EoE 3. Adenomatous colon polyp: Colonoscopy 11/2016 Dr. Ardis Hughs done for constipation: single subCM adenoma removed.  Recall at 5 years recommended. 4. Constipation 2018: Functional, improved with daily fiber supplement    HPI: This is a very pleasant 41 year old man whom I last saw the time of colonoscopy and upper endoscopy about 6 weeks ago.  Chief complaint is celiac sprue, constipation  Taking fiber psyllium husk and he has noticed clear improvement in his bowel habits.  He is very good about avoiding gluten.  If he misses, will get bloating and headaches.  He is swallowing easier, really not bothered by dysphagia at all since his procedure.  ROS: complete GI ROS as described in HPI, all other review negative.  Constitutional:  No unintentional weight loss  Weight up 10 pounds in 6 months (same scale here in GI office).   Past Medical History:  Diagnosis Date  . Allergy   . Anxiety   . Celiac disease   . History of substance abuse   . HIV infection ([place])   . Raynaud disease   . Seizures ([place])     Past Surgical History:  Procedure Laterality Date  . WISDOM TOOTH EXTRACTION      Current Outpatient Prescriptions  Medication Sig Dispense Refill  . Cetirizine HCl (ZYRTEC ALLERGY) 10 MG CAPS Take 1 capsule (10 mg total) by mouth daily. 30 capsule   . fluconazole (DIFLUCAN) 200 MG tablet Take 1 tablet (200 mg total) by mouth daily. 5 tablet 2  . guanFACINE (TENEX) 1 MG tablet Take 1 tablet (1 mg total) by mouth 3 (three) times daily. 270 tablet 1  . lurasidone (LATUDA) 20 MG TABS tablet Take 1 tablet (20 mg  total) by mouth daily. 90 tablet 1  . traZODone (DESYREL) 50 MG tablet Take 1 tablet (50 mg total) by mouth at bedtime. 90 tablet 1  . TRIUMEQ 600-50-300 MG tablet TAKE 1 TABLET BY MOUTH ONCE EVERY DAY 90 tablet 2  . Vitamin D, Cholecalciferol, 1000 units CAPS Take 1 capsule by mouth daily. 90 capsule 2   Current Facility-Administered Medications  Medication Dose Route Frequency Provider Last Rate Last Dose  . 0.9 %  sodium chloride infusion  500 mL Intravenous Continuous Milus Banister, MD        Allergies as of 01/06/2017 - Review Complete 01/06/2017  Allergen Reaction Noted  . Gluten meal Other (See Comments) 02/26/2015    Family History  Problem Relation Age of Onset  . Heart disease Father 44  . Hypertension Father   . Heart disease Maternal Grandfather        early 76s  . Hypertension Maternal Grandfather   . Hypertension Mother   . Rheum arthritis Mother   . Crohn's disease Mother   . Crohn's disease Sister   . Rheum arthritis Maternal Grandmother   . Breast cancer Maternal Grandmother   . Colon cancer Paternal Grandmother   . Hypertension Paternal Grandfather   . Esophageal cancer Neg Hx   . Stomach cancer Neg Hx     Social History   Social History  . Marital status: Unknown    Spouse name: N/A  . Number of children: N/A  .  Years of education: N/A   Occupational History  . Not on file.   Social History Main Topics  . Smoking status: Never Smoker  . Smokeless tobacco: Never Used  . Alcohol use 1.2 oz/week    2 Glasses of wine per week     Comment: occ  . Drug use: No     Comment: Hx of maijuana, ecstacy, GHB, ketamine, amephetamines, cocaine - nothing since 2009  . Sexual activity: Yes    Partners: Male     Comment: Uses Condoms   Other Topics Concern  . Not on file   Social History Narrative   Works in Engineer, technical sales.     Physical Exam: BP 140/80 (BP Location: Left Arm, Patient Position: Sitting, Cuff Size: Normal)   Pulse 76   Ht 5' 11"  (1.803 m)    Wt 146 lb 4 oz (66.3 kg)   BMI 20.40 kg/m  Constitutional: generally well-appearing Psychiatric: alert and oriented x3 Abdomen: soft, nontender, nondistended, no obvious ascites, no peritoneal signs, normal bowel sounds No peripheral edema noted in lower extremities  Assessment and plan: 41 y.o. male with Constipation, celiac sprue  He is very good about avoiding gluten in his diet. Biopsies from his duodenum last month were completely normal. I commended him on that he will continue to avoid gluten. His constipation is much improved since starting fiber supplements and his dysphagia has resolved. He knows to call if he has any further questions or concerns.  Please see the "Patient Instructions" section for addition details about the plan.  Owens Loffler, MD [facility] 01/06/2017, 1:43 PM

## 2017-01-09 DIAGNOSIS — Z9289 Personal history of other medical treatment: Secondary | ICD-10-CM

## 2017-01-09 HISTORY — DX: Personal history of other medical treatment: Z92.89

## 2017-01-11 ENCOUNTER — Ambulatory Visit (HOSPITAL_COMMUNITY): Payer: Self-pay | Admitting: Psychiatry

## 2017-01-12 ENCOUNTER — Encounter (HOSPITAL_COMMUNITY): Payer: Self-pay | Admitting: Psychiatry

## 2017-01-16 ENCOUNTER — Other Ambulatory Visit (HOSPITAL_COMMUNITY): Payer: Self-pay | Admitting: Psychiatry

## 2017-01-16 ENCOUNTER — Encounter (HOSPITAL_COMMUNITY): Payer: Self-pay | Admitting: Psychiatry

## 2017-01-16 DIAGNOSIS — F23 Brief psychotic disorder: Secondary | ICD-10-CM

## 2017-01-19 ENCOUNTER — Other Ambulatory Visit: Payer: Self-pay

## 2017-01-19 DIAGNOSIS — M7989 Other specified soft tissue disorders: Secondary | ICD-10-CM

## 2017-01-20 ENCOUNTER — Other Ambulatory Visit: Payer: Self-pay | Admitting: Internal Medicine

## 2017-01-20 DIAGNOSIS — L2089 Other atopic dermatitis: Secondary | ICD-10-CM | POA: Diagnosis not present

## 2017-01-20 DIAGNOSIS — F419 Anxiety disorder, unspecified: Principal | ICD-10-CM

## 2017-01-20 DIAGNOSIS — F32A Depression, unspecified: Secondary | ICD-10-CM

## 2017-01-20 DIAGNOSIS — F329 Major depressive disorder, single episode, unspecified: Secondary | ICD-10-CM

## 2017-01-26 ENCOUNTER — Ambulatory Visit
Admission: RE | Admit: 2017-01-26 | Discharge: 2017-01-26 | Disposition: A | Discharge status: Home / Self Care | Payer: 59 | Source: Ambulatory Visit | Attending: Psychiatry | Admitting: Psychiatry

## 2017-01-26 ENCOUNTER — Other Ambulatory Visit (HOSPITAL_COMMUNITY): Payer: Self-pay | Admitting: Psychiatry

## 2017-01-26 ENCOUNTER — Encounter (HOSPITAL_COMMUNITY): Payer: Self-pay | Admitting: Psychiatry

## 2017-01-26 DIAGNOSIS — F29 Unspecified psychosis not due to a substance or known physiological condition: Secondary | ICD-10-CM

## 2017-01-26 DIAGNOSIS — F23 Brief psychotic disorder: Secondary | ICD-10-CM

## 2017-01-26 MED ORDER — LURASIDONE HCL 40 MG PO TABS: 40.0000 mg | ORAL_TABLET | Freq: Every day | ORAL | 1 refills | Status: DC

## 2017-01-26 MED ORDER — GADOBENATE DIMEGLUMINE 529 MG/ML IV SOLN
14.0000 mL | Freq: Once | INTRAVENOUS | Status: AC | PRN
  Administered 2017-01-26: 14 mL via INTRAVENOUS

## 2017-01-27 ENCOUNTER — Ambulatory Visit (HOSPITAL_COMMUNITY)
Admission: AD | Admit: 2017-01-27 | Discharge: 2017-01-27 | Disposition: A | Discharge status: Home / Self Care | Payer: 59 | Attending: Psychiatry | Admitting: Psychiatry

## 2017-01-27 DIAGNOSIS — R443 Hallucinations, unspecified: Secondary | ICD-10-CM | POA: Diagnosis present

## 2017-01-27 DIAGNOSIS — R440 Auditory hallucinations: Secondary | ICD-10-CM | POA: Diagnosis not present

## 2017-01-27 DIAGNOSIS — F329 Major depressive disorder, single episode, unspecified: Secondary | ICD-10-CM | POA: Insufficient documentation

## 2017-01-27 NOTE — H&P (Signed)
Behavioral Health Medical Screening Exam  John Robbins is an 41 y.o. male.  Total Time spent with patient: 15 minutes  Psychiatric Specialty Exam: Physical Exam  Constitutional: He is oriented to person, place, and time. He appears well-developed and well-nourished. No distress.  HENT:  Head: Normocephalic and atraumatic.  Right Ear: External ear normal.  Left Ear: External ear normal.  Eyes: Conjunctivae are normal. Right eye exhibits no discharge. Left eye exhibits no discharge. No scleral icterus.  Cardiovascular: Normal rate.   Respiratory: Effort normal. No respiratory distress.  Musculoskeletal: Normal range of motion.  Neurological: He is alert and oriented to person, place, and time.  Skin: Skin is warm and dry. He is not diaphoretic.  Psychiatric: His speech is normal. His mood appears anxious. His affect is not blunt and not labile. He is actively hallucinating. Cognition and memory are normal. He expresses impulsivity and inappropriate judgment. He exhibits a depressed mood. He expresses no homicidal and no suicidal ideation.    Review of Systems  Psychiatric/Behavioral: Positive for depression and hallucinations. Negative for memory loss, substance abuse and suicidal ideas. The patient is nervous/anxious and has insomnia.   All other systems reviewed and are negative.   Blood pressure (!) 136/94, pulse 60, temperature 98.6 F (37 C), temperature source Oral, resp. rate 16, SpO2 100 %.There is no height or weight on file to calculate BMI.  General Appearance: Casual and Well Groomed  Eye Contact:  Good  Speech:  Clear and Coherent and Normal Rate  Volume:  Normal  Mood:  Anxious and Depressed  Affect:  Congruent and Depressed  Thought Process:  Coherent and Goal Directed  Orientation:  Full (Time, Place, and Person)  Thought Content:  Logical and Hallucinations: Auditory  Suicidal Thoughts:  No  Homicidal Thoughts:  No  Memory:  Immediate;   Good Recent;    Good Remote;   Good  Judgement:  Intact  Insight:  Good  Psychomotor Activity:  Normal  Concentration: Concentration: Good and Attention Span: Good  Recall:  Good  Fund of Knowledge:Good  Language: Good  Akathisia:  No  Handed:  Right  AIMS (if indicated):     Assets:  Communication Skills Desire for Improvement Financial Resources/Insurance Housing Leisure Time Physical Health Resilience Transportation  Sleep:       Musculoskeletal: Strength & Muscle Tone: within normal limits Gait & Station: normal   Blood pressure (!) 136/94, pulse 60, temperature 98.6 F (37 C), temperature source Oral, resp. rate 16, SpO2 100 %.  Recommendations:  Based on my evaluation the patient does not appear to have an emergency medical condition.  Rozetta Nunnery, NP 01/27/2017, 11:23 PM
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
## 2017-02-06 ENCOUNTER — Encounter (HOSPITAL_COMMUNITY): Payer: Self-pay | Admitting: Emergency Medicine

## 2017-02-06 ENCOUNTER — Emergency Department (HOSPITAL_COMMUNITY)
Admission: EM | Admit: 2017-02-06 | Discharge: 2017-02-07 | Disposition: A | Discharge status: Other Acute Inpatient Hospital | Payer: 59 | Attending: Emergency Medicine | Admitting: Emergency Medicine

## 2017-02-06 ENCOUNTER — Telehealth (HOSPITAL_COMMUNITY): Payer: Self-pay

## 2017-02-06 DIAGNOSIS — R440 Auditory hallucinations: Secondary | ICD-10-CM

## 2017-02-06 DIAGNOSIS — F1910 Other psychoactive substance abuse, uncomplicated: Secondary | ICD-10-CM | POA: Diagnosis not present

## 2017-02-06 DIAGNOSIS — B20 Human immunodeficiency virus [HIV] disease: Secondary | ICD-10-CM | POA: Insufficient documentation

## 2017-02-06 DIAGNOSIS — F1520 Other stimulant dependence, uncomplicated: Secondary | ICD-10-CM

## 2017-02-06 DIAGNOSIS — R450 Nervousness: Secondary | ICD-10-CM | POA: Diagnosis not present

## 2017-02-06 DIAGNOSIS — R9431 Abnormal electrocardiogram [ECG] [EKG]: Secondary | ICD-10-CM | POA: Diagnosis not present

## 2017-02-06 DIAGNOSIS — Z046 Encounter for general psychiatric examination, requested by authority: Secondary | ICD-10-CM | POA: Diagnosis not present

## 2017-02-06 DIAGNOSIS — Z79899 Other long term (current) drug therapy: Secondary | ICD-10-CM | POA: Insufficient documentation

## 2017-02-06 DIAGNOSIS — F419 Anxiety disorder, unspecified: Secondary | ICD-10-CM | POA: Diagnosis not present

## 2017-02-06 DIAGNOSIS — F250 Schizoaffective disorder, bipolar type: Secondary | ICD-10-CM

## 2017-02-06 LAB — COMPREHENSIVE METABOLIC PANEL
ALT: 21 U/L (ref 17–63)
AST: 23 U/L (ref 15–41)
Albumin: 4 g/dL (ref 3.5–5.0)
Alkaline Phosphatase: 54 U/L (ref 38–126)
Anion gap: 10 (ref 5–15)
BUN: 16 mg/dL (ref 6–20)
CHLORIDE: 102 mmol/L (ref 101–111)
CO2: 26 mmol/L (ref 22–32)
CREATININE: 0.98 mg/dL (ref 0.61–1.24)
Calcium: 8.6 mg/dL — ABNORMAL LOW (ref 8.9–10.3)
GFR calc Af Amer: >60 mL/min (ref >60)
Glucose, Bld: 105 mg/dL — ABNORMAL HIGH (ref 65–99)
Potassium: 4.1 mmol/L (ref 3.5–5.1)
SODIUM: 138 mmol/L (ref 135–145)
Total Bilirubin: 0.6 mg/dL (ref 0.3–1.2)
Total Protein: 6.5 g/dL (ref 6.5–8.1)

## 2017-02-06 LAB — CBC
HEMATOCRIT: 44.9 % (ref 39.0–52.0)
HEMOGLOBIN: 15.6 g/dL (ref 13.0–17.0)
MCH: 30.9 pg (ref 26.0–34.0)
MCHC: 34.7 g/dL (ref 30.0–36.0)
MCV: 88.9 fL (ref 78.0–100.0)
Platelets: 169 10*3/uL (ref 150–400)
RBC: 5.05 MIL/uL (ref 4.22–5.81)
RDW: 12.1 % (ref 11.5–15.5)
WBC: 5.3 10*3/uL (ref 4.0–10.5)

## 2017-02-06 LAB — RAPID URINE DRUG SCREEN, HOSP PERFORMED
AMPHETAMINES: POSITIVE — AB
Barbiturates: NOT DETECTED
Benzodiazepines: NOT DETECTED
Cocaine: NOT DETECTED
Opiates: NOT DETECTED
TETRAHYDROCANNABINOL: NOT DETECTED

## 2017-02-06 LAB — ACETAMINOPHEN LEVEL: Acetaminophen (Tylenol), Serum: <10 ug/mL — ABNORMAL LOW (ref 10–30)

## 2017-02-06 LAB — SALICYLATE LEVEL: Salicylate Lvl: <7 mg/dL (ref 2.8–30.0)

## 2017-02-06 LAB — ETHANOL: Alcohol, Ethyl (B): <10 mg/dL (ref <10)

## 2017-02-06 MED ORDER — TRAZODONE HCL 50 MG PO TABS
50.0000 mg | ORAL_TABLET | Freq: Every day | ORAL | Status: DC
  Administered 2017-02-06: 50 mg via ORAL
  Filled 2017-02-06: qty 1

## 2017-02-06 MED ORDER — GUANFACINE HCL 1 MG PO TABS
1.0000 mg | ORAL_TABLET | Freq: Every day | ORAL | Status: DC
  Administered 2017-02-06 – 2017-02-07 (×2): 1 mg via ORAL
  Filled 2017-02-06 (×2): qty 1

## 2017-02-06 MED ORDER — OLANZAPINE 5 MG PO TBDP
5.0000 mg | ORAL_TABLET | Freq: Once | ORAL | Status: AC
Start: 2017-02-06 — End: 2017-02-06
  Administered 2017-02-06: 5 mg via ORAL
  Filled 2017-02-06: qty 1

## 2017-02-06 MED ORDER — ALUM & MAG HYDROXIDE-SIMETH 200-200-20 MG/5ML PO SUSP: 30.0000 mL | Freq: Four times a day (QID) | ORAL | Status: DC | PRN

## 2017-02-06 MED ORDER — ONDANSETRON HCL 4 MG PO TABS: 4.0000 mg | ORAL_TABLET | Freq: Three times a day (TID) | ORAL | Status: DC | PRN

## 2017-02-06 MED ORDER — ABACAVIR-DOLUTEGRAVIR-LAMIVUD 600-50-300 MG PO TABS
1.0000 | ORAL_TABLET | Freq: Every day | ORAL | Status: DC
  Administered 2017-02-06 – 2017-02-07 (×2): 1 via ORAL
  Filled 2017-02-06 (×2): qty 1

## 2017-02-06 MED ORDER — ACETAMINOPHEN 325 MG PO TABS: 650.0000 mg | ORAL_TABLET | ORAL | Status: DC | PRN

## 2017-02-06 NOTE — ED Notes (Signed)
Bed: [facility] Expected date:  Expected time:  Means of arrival:  Comments: Res A
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
## 2017-02-06 NOTE — ED Notes (Signed)
After patient triaged, patient called for nurse stating he didn't feel well and going numb. Pt very pale and appeared to be close to having syncopal episode. Pt stated he took trazadone approximately 30 min prior to ED arrival. Pt had bottle with him, many pills noted in bottle, bottle given to Sheryn Bison, Therapist, sports. Pt taken to Res room and patient began to feel better, states syncopal symptoms resolved. Pt placed on monitor.

## 2017-02-06 NOTE — ED Triage Notes (Signed)
Pt c/o hearing voices that are saying "they want to kill me". Has hx of psychiatric disorder but states hearing voices has been worsening. He continues taking [place] but states he has also been abusing alcohol (yesterday last dose) and Meth (Friday last dose) to escape the voices. Pt admits to thoughts of hurting self at times, no plan.

## 2017-02-06 NOTE — Telephone Encounter (Signed)
Patient called again and said he needed to go to the hospital, I asked patient if he had someone to take him and he said he did not. I advised patient to call 911 and have them take him to [facility].

## 2017-02-06 NOTE — Telephone Encounter (Signed)
Patient called and said that he got the message that his MRI is normal, but he does not understand what that means. He wants to know why he is hearing voices. Please call him back, thank you

## 2017-02-06 NOTE — Telephone Encounter (Signed)
I agree, thank you.

## 2017-02-06 NOTE — ED Provider Notes (Signed)
[facility] DEPT Provider Note   CSN: [id number] Arrival date & time: 02/06/17  1326     History   Chief Complaint Chief Complaint  Patient presents with  . Psychiatric Evaluation    HPI John Robbins is a 41 y.o. male.  HPI   41 year old male with past medical history as below including HIV, history of substance use, history of psychotic disorder who presents with worsening auditory hallucinations.  The patient states that he is currently on multiple medications for auditory hallucinations.  He has been hospitalized in May in [place] for this.  He states that over the last several weeks, he has had progressive worsening auditory hallucinations that are telling him to hurt himself as well as criticizing him.  He has been self-medicating with alcohol and amphetamines for this.  He feels like he is having increasing difficulty ignoring them and subsequently presents for help in evaluation.  Denies overt SI or HI but does admit that his voices tell him to hurt himself.  There are multiple voices of people that he know.  These have been the same voices that he has heard for some time, but they are worsening.  He is adamant that he is adherent with his HIV medications and his last CD4 was reportedly over 600.  He has chronic headaches that are not acutely worsened.  Past Medical History:  Diagnosis Date  . Allergy   . Anxiety   . Celiac disease   . History of substance abuse   . HIV infection ([place])   . Raynaud disease   . Seizures [facility])     Patient Active Problem List   Diagnosis Date Noted  . Tinnitus of both ears 09/20/2016  . Raynaud's disease without gangrene 09/20/2016  . Polyarthralgia 09/20/2016  . Venereal disease contact 09/20/2016  . Back pain 08/26/2016  . Bilateral hand swelling 08/26/2016  . Screen for STD (sexually transmitted disease) 08/26/2016  . Fatigue 08/08/2016  . Dysphagia 08/03/2016  . Weight loss 08/03/2016  . Rash and  nonspecific skin eruption 08/03/2016  . Constipation 07/01/2016  . Anxiety and depression 07/01/2016  . Human immunodeficiency virus (HIV) disease ([place]) 02/26/2015  . Screening examination for venereal disease 02/26/2015  . Encounter for long-term (current) use of medications 02/26/2015  . Celiac disease 02/26/2015  . Seizure disorder ([place]) 02/26/2015    Past Surgical History:  Procedure Laterality Date  . WISDOM TOOTH EXTRACTION         Home Medications    Prior to Admission medications   Medication Sig Start Date End Date Taking? Authorizing Provider  guanFACINE (TENEX) 1 MG tablet Take 1 tablet (1 mg total) by mouth 3 (three) times daily. Patient taking differently: Take 1 mg by mouth See admin instructions. Takes at least 1 mg every day but can take up to 3 per day if he needs too 01/05/17  Yes Eksir, Richard Miu, MD  lurasidone (LATUDA) 40 MG TABS tablet Take 1 tablet (40 mg total) by mouth daily with breakfast. Patient taking differently: Take 40 mg by mouth at bedtime.  01/26/17  Yes Eksir, Richard Miu, MD  traZODone (DESYREL) 50 MG tablet Take 1 tablet (50 mg total) by mouth at bedtime. 01/05/17  Yes Eksir, Richard Miu, MD  TRIUMEQ 600-50-300 MG tablet TAKE 1 TABLET BY MOUTH ONCE EVERY DAY 07/29/16  Yes Comer, Okey Regal, MD  Vitamin D, Cholecalciferol, 1000 units CAPS Take 1 capsule by mouth daily. Patient not taking: Reported on 02/06/2017 08/08/16  Maryellen Pile, MD    Family History Family History  Problem Relation Age of Onset  . Heart disease Father 36  . Hypertension Father   . Heart disease Maternal Grandfather        early 30s  . Hypertension Maternal Grandfather   . Hypertension Mother   . Rheum arthritis Mother   . Crohn's disease Mother   . Crohn's disease Sister   . Rheum arthritis Maternal Grandmother   . Breast cancer Maternal Grandmother   . Colon cancer Paternal Grandmother   . Hypertension Paternal Grandfather   . Esophageal cancer Neg  Hx   . Stomach cancer Neg Hx     Social History Social History  Substance Use Topics  . Smoking status: Never Smoker  . Smokeless tobacco: Never Used  . Alcohol use 1.2 oz/week    2 Glasses of wine per week     Comment: occ     Allergies   Gluten meal   Review of Systems Review of Systems  Constitutional: Negative for chills, fatigue and fever.  HENT: Negative for congestion and rhinorrhea.   Eyes: Negative for visual disturbance.  Respiratory: Negative for cough, shortness of breath and wheezing.   Cardiovascular: Negative for chest pain and leg swelling.  Gastrointestinal: Negative for abdominal pain, diarrhea, nausea and vomiting.  Genitourinary: Negative for dysuria and flank pain.  Musculoskeletal: Negative for neck pain and neck stiffness.  Skin: Negative for rash and wound.  Allergic/Immunologic: Negative for immunocompromised state.  Neurological: Negative for syncope, weakness and headaches.  Psychiatric/Behavioral: Positive for hallucinations. The patient is nervous/anxious.   All other systems reviewed and are negative.    Physical Exam Updated Vital Signs BP 122/77   Pulse 63   Temp 98.6 F (37 C) (Oral)   Resp 14   SpO2 96%   Physical Exam  Constitutional: He is oriented to person, place, and time. He appears well-developed and well-nourished. No distress.  HENT:  Head: Normocephalic and atraumatic.  Eyes: Conjunctivae are normal.  Neck: Neck supple.  Cardiovascular: Normal rate, regular rhythm and normal heart sounds.  Exam reveals no friction rub.   No murmur heard. Pulmonary/Chest: Effort normal and breath sounds normal. No respiratory distress. He has no wheezes. He has no rales.  Abdominal: He exhibits no distension.  Musculoskeletal: He exhibits no edema.  Neurological: He is alert and oriented to person, place, and time. He exhibits normal muscle tone.  Skin: Skin is warm. Capillary refill takes less than 2 seconds.  Psychiatric: He has a  normal mood and affect. He is actively hallucinating.  Nursing note and vitals reviewed.   Neurological Exam:  Mental Status: Alert and oriented to person, place, and time. Attention and concentration normal. Speech clear. Recent memory is intact. Cranial Nerves: Visual fields grossly intact. EOMI and PERRLA. No nystagmus noted. Facial sensation intact at forehead, maxillary cheek, and chin/mandible bilaterally. No facial asymmetry or weakness. Hearing grossly normal. Uvula is midline, and palate elevates symmetrically. Normal SCM and trapezius strength. Tongue midline without fasciculations. Motor: Muscle strength 5/5 in proximal and distal UE and LE bilaterally. No pronator drift. Muscle tone normal. Reflexes: 2+ and symmetrical in all four extremities.  Sensation: Intact to light touch in upper and lower extremities distally bilaterally.  Gait: Normal without ataxia. Coordination: Normal FTN bilaterally.     ED Treatments / Results  Labs (all labs ordered are listed, but only abnormal results are displayed) Labs Reviewed  COMPREHENSIVE METABOLIC PANEL - Abnormal; Notable for the  following:       Result Value   Glucose, Bld 105 (*)    Calcium 8.6 (*)    All other components within normal limits  ACETAMINOPHEN LEVEL - Abnormal; Notable for the following:    Acetaminophen (Tylenol), Serum <10 (*)    All other components within normal limits  RAPID URINE DRUG SCREEN, HOSP PERFORMED - Abnormal; Notable for the following:    Amphetamines POSITIVE (*)    All other components within normal limits  ETHANOL  SALICYLATE LEVEL  CBC    EKG  EKG Interpretation  Date/Time:  Monday February 06 2017 14:01:54 EDT Ventricular Rate:  64 PR Interval:    QRS Duration: 89 QT Interval:  434 QTC Calculation: 448 R Axis:   96 Text Interpretation:  Sinus rhythm Borderline right axis deviation ST elev, probable normal early repol pattern No significant change since last tracing Confirmed by  Duffy Bruce [phone]) on 02/06/2017 5:13:48 PM       Radiology No results found.  Procedures Procedures (including critical care time)  Medications Ordered in ED Medications  acetaminophen (TYLENOL) tablet 650 mg (not administered)  ondansetron (ZOFRAN) tablet 4 mg (not administered)  alum & mag hydroxide-simeth (MAALOX/MYLANTA) 200-200-20 MG/5ML suspension 30 mL (not administered)  guanFACINE (TENEX) tablet 1 mg (1 mg Oral Given 02/06/17 1613)  traZODone (DESYREL) tablet 50 mg (not administered)  abacavir-dolutegravir-lamiVUDine (TRIUMEQ) 600-50-300 MG per tablet 1 tablet (1 tablet Oral Given 02/06/17 1613)  OLANZapine zydis (ZYPREXA) disintegrating tablet 5 mg (5 mg Oral Given 02/06/17 1457)     Initial Impression / Assessment and Plan / ED Course  I have reviewed the triage vital signs and the nursing notes.  Pertinent labs & imaging results that were available during my care of the patient were reviewed by me and considered in my medical decision making (see chart for details).     41 yo M with PMHx as above here with worsening CAH. Pt has h/o HIV but has known psychotic d/o, is compliant with his meds with last CD4 560, and no focal neuro deficits, no significant HA to suggest crypto, meningitis, encephalitis. He has had neg imaging in past as well. Medically stable for psych dispo.  Final Clinical Impressions(s) / ED Diagnoses   Final diagnoses:  Auditory hallucination    New Prescriptions New Prescriptions   No medications on file     Duffy Bruce, MD 02/06/17 2048

## 2017-02-06 NOTE — Telephone Encounter (Signed)
I've spoken with him, and recommended he do IOP.

## 2017-02-06 NOTE — BH Assessment (Signed)
[facility] Assessment Progress Note   Case was staffed with Romilda Garret FNP who recommended patient be observed and monitored for safety with possible medication adjustments/evaluation.

## 2017-02-06 NOTE — ED Notes (Signed)
Patient admitted on unit. Patient stated "I'm starving" Staff offered patient sandwich but patient declined stated "I have Celiac disease. I can't eat bread or the meat in the bread" patient refused many choice of food offered. Patient later accepted 3 cheese strings. Mildly irritable. Appear sleeping at this time. Will continue to monitor patient.

## 2017-02-07 ENCOUNTER — Inpatient Hospital Stay (HOSPITAL_COMMUNITY): Admit: 2017-02-07 | Payer: Self-pay

## 2017-02-07 ENCOUNTER — Encounter (HOSPITAL_COMMUNITY): Payer: Self-pay | Admitting: Psychiatry

## 2017-02-07 ENCOUNTER — Ambulatory Visit (HOSPITAL_COMMUNITY): Payer: Self-pay

## 2017-02-07 ENCOUNTER — Inpatient Hospital Stay (HOSPITAL_COMMUNITY): Admission: AD | Admit: 2017-02-07 | Payer: 59 | Source: Intra-hospital | Admitting: Psychiatry

## 2017-02-07 ENCOUNTER — Inpatient Hospital Stay
Admission: AD | Admit: 2017-02-07 | Discharge: 2017-02-11 | DRG: 885 | Disposition: A | Discharge status: Home / Self Care | Payer: 59 | Source: Intra-hospital | Attending: Psychiatry | Admitting: Psychiatry

## 2017-02-07 DIAGNOSIS — G40909 Epilepsy, unspecified, not intractable, without status epilepticus: Secondary | ICD-10-CM | POA: Diagnosis present

## 2017-02-07 DIAGNOSIS — Z79899 Other long term (current) drug therapy: Secondary | ICD-10-CM

## 2017-02-07 DIAGNOSIS — F1020 Alcohol dependence, uncomplicated: Secondary | ICD-10-CM | POA: Diagnosis present

## 2017-02-07 DIAGNOSIS — F1910 Other psychoactive substance abuse, uncomplicated: Secondary | ICD-10-CM

## 2017-02-07 DIAGNOSIS — Z91018 Allergy to other foods: Secondary | ICD-10-CM | POA: Diagnosis not present

## 2017-02-07 DIAGNOSIS — I7300 Raynaud's syndrome without gangrene: Secondary | ICD-10-CM | POA: Diagnosis present

## 2017-02-07 DIAGNOSIS — G4700 Insomnia, unspecified: Secondary | ICD-10-CM | POA: Diagnosis present

## 2017-02-07 DIAGNOSIS — R45851 Suicidal ideations: Secondary | ICD-10-CM | POA: Diagnosis not present

## 2017-02-07 DIAGNOSIS — Z818 Family history of other mental and behavioral disorders: Secondary | ICD-10-CM | POA: Diagnosis not present

## 2017-02-07 DIAGNOSIS — F329 Major depressive disorder, single episode, unspecified: Secondary | ICD-10-CM | POA: Diagnosis present

## 2017-02-07 DIAGNOSIS — F1520 Other stimulant dependence, uncomplicated: Secondary | ICD-10-CM | POA: Diagnosis present

## 2017-02-07 DIAGNOSIS — F909 Attention-deficit hyperactivity disorder, unspecified type: Secondary | ICD-10-CM | POA: Diagnosis present

## 2017-02-07 DIAGNOSIS — F419 Anxiety disorder, unspecified: Secondary | ICD-10-CM

## 2017-02-07 DIAGNOSIS — F250 Schizoaffective disorder, bipolar type: Secondary | ICD-10-CM | POA: Diagnosis present

## 2017-02-07 DIAGNOSIS — F4310 Post-traumatic stress disorder, unspecified: Secondary | ICD-10-CM | POA: Diagnosis present

## 2017-02-07 DIAGNOSIS — Y900 Blood alcohol level of less than 20 mg/100 ml: Secondary | ICD-10-CM | POA: Diagnosis present

## 2017-02-07 DIAGNOSIS — F19959 Other psychoactive substance use, unspecified with psychoactive substance-induced psychotic disorder, unspecified: Secondary | ICD-10-CM | POA: Insufficient documentation

## 2017-02-07 DIAGNOSIS — R440 Auditory hallucinations: Secondary | ICD-10-CM | POA: Insufficient documentation

## 2017-02-07 DIAGNOSIS — Z21 Asymptomatic human immunodeficiency virus [HIV] infection status: Secondary | ICD-10-CM | POA: Diagnosis present

## 2017-02-07 DIAGNOSIS — B20 Human immunodeficiency virus [HIV] disease: Secondary | ICD-10-CM | POA: Diagnosis present

## 2017-02-07 DIAGNOSIS — R450 Nervousness: Secondary | ICD-10-CM | POA: Diagnosis not present

## 2017-02-07 MED ORDER — MAGNESIUM HYDROXIDE 400 MG/5ML PO SUSP: 30.0000 mL | Freq: Every day | ORAL | Status: DC | PRN

## 2017-02-07 MED ORDER — TRAZODONE HCL 100 MG PO TABS: 100.0000 mg | ORAL_TABLET | Freq: Every evening | ORAL | Status: DC | PRN

## 2017-02-07 MED ORDER — MAGNESIUM HYDROXIDE 400 MG/5ML PO SUSP
30.0000 mL | Freq: Every day | ORAL | Status: DC | PRN
Start: 2017-02-07 — End: 2017-02-07

## 2017-02-07 MED ORDER — TRAZODONE HCL 50 MG PO TABS
50.0000 mg | ORAL_TABLET | Freq: Every day | ORAL | Status: DC
  Administered 2017-02-08 – 2017-02-10 (×3): 50 mg via ORAL
  Filled 2017-02-07 (×3): qty 1

## 2017-02-07 MED ORDER — ACETAMINOPHEN 325 MG PO TABS: 650.0000 mg | ORAL_TABLET | Freq: Four times a day (QID) | ORAL | Status: DC | PRN

## 2017-02-07 MED ORDER — ALUM & MAG HYDROXIDE-SIMETH 200-200-20 MG/5ML PO SUSP
30.0000 mL | ORAL | Status: DC | PRN
Start: 2017-02-07 — End: 2017-02-07

## 2017-02-07 MED ORDER — HYDROXYZINE HCL 25 MG PO TABS
25.0000 mg | ORAL_TABLET | Freq: Three times a day (TID) | ORAL | Status: DC
  Administered 2017-02-08 – 2017-02-11 (×11): 25 mg via ORAL
  Filled 2017-02-07 (×11): qty 1

## 2017-02-07 MED ORDER — LURASIDONE HCL 40 MG PO TABS
40.0000 mg | ORAL_TABLET | Freq: Every day | ORAL | Status: DC
  Filled 2017-02-07: qty 1

## 2017-02-07 MED ORDER — GUANFACINE HCL 1 MG PO TABS
1.0000 mg | ORAL_TABLET | Freq: Every day | ORAL | Status: DC
  Administered 2017-02-08 – 2017-02-11 (×4): 1 mg via ORAL
  Filled 2017-02-07 (×4): qty 1

## 2017-02-07 MED ORDER — LURASIDONE HCL 60 MG PO TABS
60.0000 mg | ORAL_TABLET | Freq: Every day | ORAL | Status: DC
  Filled 2017-02-07: qty 1

## 2017-02-07 MED ORDER — LURASIDONE HCL 40 MG PO TABS
60.0000 mg | ORAL_TABLET | Freq: Every day | ORAL | Status: DC
  Administered 2017-02-07: 60 mg via ORAL
  Filled 2017-02-07 (×2): qty 1

## 2017-02-07 MED ORDER — ABACAVIR-DOLUTEGRAVIR-LAMIVUD 600-50-300 MG PO TABS
1.0000 | ORAL_TABLET | Freq: Every day | ORAL | Status: DC
  Administered 2017-02-08 – 2017-02-11 (×4): 1 via ORAL
  Filled 2017-02-07 (×4): qty 1

## 2017-02-07 MED ORDER — ALUM & MAG HYDROXIDE-SIMETH 200-200-20 MG/5ML PO SUSP: 30.0000 mL | ORAL | Status: DC | PRN

## 2017-02-07 NOTE — BH Assessment (Signed)
[facility] Assessment Progress Note  Per Buford Dresser, DO, this pt requires psychiatric hospitalization at this time.  The following facilities have been contacted to seek placement for this pt, with results as noted:  Beds available, information sent, decision pending:  [facility]:  Lockney Ridgely, [place] Triage Specialist [phone]

## 2017-02-07 NOTE — Progress Notes (Signed)
Patient ID: John Robbins, male   DOB: [birth year], 41 y.o.   MRN: [id number]   I spoke with the patient's mom, as she is his emergency contact and has been calling worried about him.  I notified her of his status in the emergency department.  She reports that she is worried he has been using drugs, she has asked him and he is denied.  She reports that he has a habit of buying drugs off the street and has done this in the past contributing to these presentations.  Spent time discussing his current status and recommendation of psychiatric hospitalization, and I suspect social work will be in touch after he is psychiatrically hospitalized, so that there can be a family meeting and coordination of care and substance treatment for substance-induced psychosis and amphetamine use disorder.

## 2017-02-07 NOTE — ED Notes (Signed)
Pt transported to [facility] by [facility] transportation service for continuation of specialized care. Belongings given to driver after patient signed for them. Pt left in no acute distress.

## 2017-02-07 NOTE — Consult Note (Addendum)
[facility] Consult   Reason for Consult:  Auditory hallucinations Referring Physician:  EDP Patient Identification: John Robbins MRN:  [id number] Principal Diagnosis: Auditory hallucination Diagnosis:   Patient Active Problem List   Diagnosis Date Noted  . Auditory hallucination [R44.0] 02/07/2017  . Tinnitus of both ears [H93.13] 09/20/2016  . Raynaud's disease without gangrene [I73.00] 09/20/2016  . Polyarthralgia [M25.50] 09/20/2016  . Venereal disease contact [Z20.2] 09/20/2016  . Back pain [M54.9] 08/26/2016  . Bilateral hand swelling [M79.89] 08/26/2016  . Screen for STD (sexually transmitted disease) [Z11.3] 08/26/2016  . Fatigue [R53.83] 08/08/2016  . Dysphagia [R13.10] 08/03/2016  . Weight loss [R63.4] 08/03/2016  . Rash and nonspecific skin eruption [R21] 08/03/2016  . Constipation [K59.00] 07/01/2016  . Anxiety and depression [F41.9, F32.9] 07/01/2016  . Human immunodeficiency virus (HIV) disease ([place]) [B20] 02/26/2015  . Screening examination for venereal disease [Z11.3] 02/26/2015  . Encounter for long-term (current) use of medications [Z79.899] 02/26/2015  . Celiac disease [K90.0] 02/26/2015  . Seizure disorder [facility]) [Y09.983] 02/26/2015    Total Time spent with patient: 45 minutes  Subjective:   John Robbins is a 41 y.o. male patient admitted with auditory hallucinations.  HPI:   John Robbins is followed by his outpatient psychiatrist, Dr. Daron Offer. He was last seen on 9/27 with worsening auditory hallucinations and paranoia. There was a concern for neuropsychiatric manifestations of HIV so a MRI of the brain was obtained. It was unremarkable. Abilify 5 mg daily was discontinued due to reported symptoms of akathisia and Latuda 20 mg daily was started. He contacted his outpatient provider yesterday and was recommended to start IOP but the patient felt very distressed due to ongoing [place] and came to the ED.  On interview, he reports that the voices  have been stressing him out. He reports that there are about 10 voices of both males and females. They have a dialogue about what he is doing and saying. They sometimes scream at him and tell him that he is being untruthful. They tell him to kill himself. They sound like his subconsciousness but he believes that he hears them through the walls. The voices started at the beginning of this year. He started using methamphetamine and alcohol to cope with the voices in June. He reports using methamphetamine once a week. He last used it on Friday. It helps to make the voices go away. He reports several stressors prior to onset of the voices this year. 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 He also started graduate school and had a breakup. He does not believe that [place] is helpful. Abilify was helpful but he could not tolerate the side effects. He also reports feeling paranoid due to the voices. He reports that he wants life to end due to the voices. He reports anhedonia and hopelessness. He sleeps well with Trazodone. He denies a history of manic symptoms.   Past Psychiatric History: Bipolar disorder (diagnosed at [facility] in May when hospitalized for 5 days).   Risk to Self: Suicidal Ideation: Yes secondary to AH. Suicidal Intent: No Is patient at risk for suicide?: No Suicidal Plan?: No Access to Means: No What has been your use of drugs/alcohol within the last 12 months?: Current use How many times?: 0 Other Self Harm Risks:  (NA) Triggers for Past Attempts: Unknown Intentional Self Injurious Behavior: None Risk to Others: Homicidal Ideation: No Thoughts of Harm to Others: No Current Homicidal Intent: No Current  Homicidal Plan: No Access to Homicidal Means: No Identified Victim: NA History of harm to others?: No Assessment of Violence: None Noted Violent Behavior Description: NA Does patient have access to weapons?: No Criminal Charges Pending?: No Does  patient have a court date: No Prior Inpatient Therapy: Prior Inpatient Therapy: Yes Prior Therapy Dates: May 2018 Prior Therapy Facilty/Provider(s): [facility] Reason for Treatment: Anxiety Prior Outpatient Therapy: Prior Outpatient Therapy: Yes Prior Therapy Dates: 01/26/17 Prior Therapy Facilty/Provider(s): Dr. Daron Offer Reason for Treatment: Anxiety Does patient have an ACCT team?: No Does patient have Intensive In-House Services?  : No Does patient have [facility] services? : No Does patient have P4CC services?: No  Past Medical History:  Past Medical History:  Diagnosis Date  . Allergy   . Anxiety   . Celiac disease   . History of substance abuse   . HIV infection ([place])   . Raynaud disease   . Seizures ([place])     Past Surgical History:  Procedure Laterality Date  . WISDOM TOOTH EXTRACTION     Family History:  Family History  Problem Relation Age of Onset  . Heart disease Father 56  . Hypertension Father   . Heart disease Maternal Grandfather        early 35s  . Hypertension Maternal Grandfather   . Hypertension Mother   . Rheum arthritis Mother   . Crohn's disease Mother   . Crohn's disease Sister   . Rheum arthritis Maternal Grandmother   . Breast cancer Maternal Grandmother   . Colon cancer Paternal Grandmother   . Hypertension Paternal Grandfather   . Esophageal cancer Neg Hx   . Stomach cancer Neg Hx    Family Psychiatric  History: Brother-ADHD Social History:  History  Alcohol Use  . 1.2 oz/week  . 2 Glasses of wine per week    Comment: occ     History  Drug Use No    Comment: Hx of maijuana, ecstacy, GHB, ketamine, amephetamines, cocaine - nothing since 2009    Social History   Social History  . Marital status: Unknown    Spouse name: N/A  . Number of children: N/A  . Years of education: N/A   Social History Main Topics  . Smoking status: Never Smoker  . Smokeless tobacco: Never Used  . Alcohol use 1.2 oz/week    2 Glasses of wine  per week     Comment: occ  . Drug use: No     Comment: Hx of maijuana, ecstacy, GHB, ketamine, amephetamines, cocaine - nothing since 2009  . Sexual activity: Yes    Partners: Male     Comment: Uses Condoms   Other Topics Concern  . None   Social History Narrative   Works in Engineer, technical sales.   Additional Social History: He lives at home alone. He is from [place] where his family still lives.     Allergies:   Allergies  Allergen Reactions  . Gluten Meal Other (See Comments)    Neurological    Labs:  Results for orders placed or performed during the hospital encounter of 02/06/17 (from the past 48 hour(s))  Rapid urine drug screen (hospital performed)     Status: Abnormal   Collection Time: 02/06/17  1:47 PM  Result Value Ref Range   Opiates NONE DETECTED NONE DETECTED   Cocaine NONE DETECTED NONE DETECTED   Benzodiazepines NONE DETECTED NONE DETECTED   Amphetamines POSITIVE (A) NONE DETECTED   Tetrahydrocannabinol NONE DETECTED NONE DETECTED  Barbiturates NONE DETECTED NONE DETECTED    Comment:        DRUG SCREEN FOR MEDICAL PURPOSES ONLY.  IF CONFIRMATION IS NEEDED FOR ANY PURPOSE, NOTIFY LAB WITHIN 5 DAYS.        LOWEST DETECTABLE LIMITS FOR URINE DRUG SCREEN Drug Class       Cutoff (ng/mL) Amphetamine      1000 Barbiturate      200 Benzodiazepine   850 Tricyclics       277 Opiates          300 Cocaine          300 THC              50   Comprehensive metabolic panel     Status: Abnormal   Collection Time: 02/06/17  2:30 PM  Result Value Ref Range   Sodium 138 135 - 145 mmol/L   Potassium 4.1 3.5 - 5.1 mmol/L   Chloride 102 101 - 111 mmol/L   CO2 26 22 - 32 mmol/L   Glucose, Bld 105 (H) 65 - 99 mg/dL   BUN 16 6 - 20 mg/dL   Creatinine, Ser 0.98 0.61 - 1.24 mg/dL   Calcium 8.6 (L) 8.9 - 10.3 mg/dL   Total Protein 6.5 6.5 - 8.1 g/dL   Albumin 4.0 3.5 - 5.0 g/dL   AST 23 15 - 41 U/L   ALT 21 17 - 63 U/L   Alkaline Phosphatase 54 38 - 126 U/L   Total Bilirubin 0.6  0.3 - 1.2 mg/dL   GFR calc non Af Amer >60 >60 mL/min   GFR calc Af Amer >60 >60 mL/min    Comment: (NOTE) The eGFR has been calculated using the CKD EPI equation. This calculation has not been validated in all clinical situations. eGFR's persistently <60 mL/min signify possible Chronic Kidney Disease.    Anion gap 10 5 - 15  Ethanol     Status: None   Collection Time: 02/06/17  2:30 PM  Result Value Ref Range   Alcohol, Ethyl (B) <10 <10 mg/dL    Comment:        LOWEST DETECTABLE LIMIT FOR SERUM ALCOHOL IS 10 mg/dL FOR MEDICAL PURPOSES ONLY   Salicylate level     Status: None   Collection Time: 02/06/17  2:30 PM  Result Value Ref Range   Salicylate Lvl <4.1 2.8 - 30.0 mg/dL  Acetaminophen level     Status: Abnormal   Collection Time: 02/06/17  2:30 PM  Result Value Ref Range   Acetaminophen (Tylenol), Serum <10 (L) 10 - 30 ug/mL    Comment:        THERAPEUTIC CONCENTRATIONS VARY SIGNIFICANTLY. A RANGE OF 10-30 ug/mL MAY BE AN EFFECTIVE CONCENTRATION FOR MANY PATIENTS. HOWEVER, SOME ARE BEST TREATED AT CONCENTRATIONS OUTSIDE THIS RANGE. ACETAMINOPHEN CONCENTRATIONS >150 ug/mL AT 4 HOURS AFTER INGESTION AND >50 ug/mL AT 12 HOURS AFTER INGESTION ARE OFTEN ASSOCIATED WITH TOXIC REACTIONS.   cbc     Status: None   Collection Time: 02/06/17  2:30 PM  Result Value Ref Range   WBC 5.3 4.0 - 10.5 K/uL   RBC 5.05 4.22 - 5.81 MIL/uL   Hemoglobin 15.6 13.0 - 17.0 g/dL   HCT 44.9 39.0 - 52.0 %   MCV 88.9 78.0 - 100.0 fL   MCH 30.9 26.0 - 34.0 pg   MCHC 34.7 30.0 - 36.0 g/dL   RDW 12.1 11.5 - 15.5 %   Platelets 169 150 - 400  K/uL    Current Facility-Administered Medications  Medication Dose Route Frequency Provider Last Rate Last Dose  . 0.9 %  sodium chloride infusion  500 mL Intravenous Continuous Milus Banister, MD      . abacavir-dolutegravir-lamiVUDine (TRIUMEQ) [id number] MG per tablet 1 tablet  1 tablet Oral Daily Duffy Bruce, MD   1 tablet at 02/07/17 1023   . acetaminophen (TYLENOL) tablet 650 mg  650 mg Oral Q4H PRN Duffy Bruce, MD      . alum & mag hydroxide-simeth (MAALOX/MYLANTA) 200-200-20 MG/5ML suspension 30 mL  30 mL Oral Q6H PRN Duffy Bruce, MD      . guanFACINE (TENEX) tablet 1 mg  1 mg Oral Daily Duffy Bruce, MD   1 mg at 02/07/17 1022  . ondansetron (ZOFRAN) tablet 4 mg  4 mg Oral Q8H PRN Duffy Bruce, MD      . traZODone (DESYREL) tablet 50 mg  50 mg Oral QHS Duffy Bruce, MD   50 mg at 02/06/17 2243   Current Outpatient Prescriptions  Medication Sig Dispense Refill  . guanFACINE (TENEX) 1 MG tablet Take 1 tablet (1 mg total) by mouth 3 (three) times daily. (Patient taking differently: Take 1 mg by mouth See admin instructions. Takes at least 1 mg every day but can take up to 3 per day if he needs too) 270 tablet 1  . lurasidone (LATUDA) 40 MG TABS tablet Take 1 tablet (40 mg total) by mouth daily with breakfast. (Patient taking differently: Take 40 mg by mouth at bedtime. ) 30 tablet 1  . traZODone (DESYREL) 50 MG tablet Take 1 tablet (50 mg total) by mouth at bedtime. 90 tablet 1  . TRIUMEQ 600-50-300 MG tablet TAKE 1 TABLET BY MOUTH ONCE EVERY DAY 90 tablet 2  . Vitamin D, Cholecalciferol, 1000 units CAPS Take 1 capsule by mouth daily. (Patient not taking: Reported on 02/06/2017) 90 capsule 2    Musculoskeletal: Strength & Muscle Tone: within normal limits Gait & Station: normal Patient leans: N/A  Psychiatric Specialty Exam: Physical Exam  Nursing note and vitals reviewed. Constitutional: He is oriented to person, place, and time. He appears well-developed and well-nourished.  HENT:  Head: Normocephalic and atraumatic.  Neck: Normal range of motion.  Respiratory: Effort normal.  Musculoskeletal: Normal range of motion.  Neurological: He is alert and oriented to person, place, and time.  Skin: No rash noted.    Review of Systems  Psychiatric/Behavioral: Positive for depression, hallucinations (AH) and  substance abuse. Negative for suicidal ideas. The patient is nervous/anxious. The patient does not have insomnia.     Blood pressure 124/65, pulse 68, temperature 98.6 F (37 C), temperature source Oral, resp. rate 16, height 6' (1.829 m), weight 68 kg (150 lb), SpO2 98 %.Body mass index is 20.34 kg/m.  General Appearance: Well Groomed  Eye Contact:  Good  Speech:  Normal Rate  Volume:  Normal  Mood:  Anxious and Depressed  Affect:  Congruent and Dysphoric   Thought Process:  Coherent, Goal Directed and Linear  Orientation:  Full (Time, Place, and Person)  Thought Content:  Hallucinations: Auditory  Suicidal Thoughts:  Yes.  without intent/plan secondary to AH.  Homicidal Thoughts:  No  Memory:  Immediate;   Good Recent;   Good Remote;   Good  Judgement:  Fair  Insight:  Fair  Psychomotor Activity:  Normal  Concentration:  Concentration: Good and Attention Span: Good  Recall:  Good  Fund of Knowledge:  Good  Language:  Good  Akathisia:  No  Handed:  Right  AIMS (if indicated):     Assets:  Agricultural consultant Housing Resilience  ADL's:  Intact  Cognition:  WNL  Sleep:   Okay   Assessment:  John Robbins presents with worsening auditory hallucinations that are distressing and causing depressive symptoms (anhedonia, hopelessness and difficulty with daily functioning). Recent MRI of brain was unremarkable. He has failed outpatient treatment and warrants inpatient psychiatric hospitalization due to decompensation.   Treatment Plan Summary: Unspecified psychosis (substance induced versus bipolar disorder, with psychotic features): Daily contact with patient to assess and evaluate symptoms and progress in treatment and Medication management  -Continue Tenex 1 mg TID for anxiety. -Increase Latuda 40 mg daily to 60 mg daily for symptom management.    Disposition: Recommend psychiatric Inpatient admission when medically cleared.  Faythe Dingwall, DO 02/07/2017 2:23 PM

## 2017-02-07 NOTE — ED Notes (Signed)
Pt compliant with medication regimen. Pt cooperative, anxious. Pt reports increase stress. Pt endorsing AH. Pt denies SI/HI/VH. Encouragement and support provided. Special checks q 15 mins in place for safety, Video monitoring in place. Will continue to monitor.

## 2017-02-07 NOTE — BH Assessment (Signed)
Patient has been accepted to [facility].  Accepting physician is Dr. Bary Leriche.  Attending Physician will be Dr. Bary Leriche  Patient has been assigned to room 304, by [facility] Charge Nurse Lynndyl.  Call report to [phone].  Representative/Transfer Coordinator is Eashan Schipani Patient pre-admitted by [facility] Patient Access Genella Rife)   [facility] ER Staff Erasmo Downer, TTS) made aware of acceptance.

## 2017-02-07 NOTE — Patient Outreach (Signed)
ED Peer Support Specialist Patient Intake (Complete at intake & 30-60 Day Follow-up)  Name: John Robbins  MRN: [id number]  Age: 41 y.o.   Date of Admission: 02/07/2017  Intake: Initial Comments:      Primary Reason Admitted: Patient states he is currently receiving services from [facility] MD who assists with medication management for Bipolar D/O. Patient states he was recently diagnosed in June 2018 at [facility] where he was admitted for 5 days. Patient states he then followed up with after care with Daron Offer MD who continues to prescribe current medication regimen. Patient reports current compliance but states "my medications are not working." Patient reports ongoing [place] stating that he hears voices "that want to kill me". Patient has a history  of AH but states hearing voices has been worsening. He continues taking [place] but states he has also been abusing alcohol and methamphetamine  Lab values: Alcohol/ETOH:   Positive UDS?   Amphetamines:   Barbiturates:   Benzodiazepines:   Cocaine:   Opiates:   Cannabinoids:    Demographic information: Gender: Male Ethnicity: White Marital Status: Single Insurance Status:  ([place]) Ecologist (Work Neurosurgeon, Physicist, medical, etc.: No Lives with:   Living situation: House/Apartment  Reported Patient History: Patient reported health conditions: Bipolar disorder Patient aware of HIV and hepatitis status: Yes (comment) (HIV positive )  In past year, has patient visited ED for any reason? Yes  Number of ED visits: 1  Reason(s) for visit: Bipolar disorder   In past year, has patient been hospitalized for any reason?    Number of hospitalizations:    Reason(s) for hospitalization:    In past year, has patient been arrested?    Number of arrests:    Reason(s) for arrest:    In past year, has patient been incarcerated? No  Number of incarcerations:    Reason(s) for incarceration:     In past year, has patient received medication-assisted treatment? No  In past year, patient received the following treatments: Individual therapy  In past year, has patient received any harm reduction services? No  Did this include any of the following?    In past year, has patient received care from a mental health provider for diagnosis other than SUD? No  In past year, is this first time patient has overdosed? No  Number of past overdoses:    In past year, is this first time patient has been hospitalized for an overdose? No  Number of hospitalizations for overdose(s):    Is patient currently receiving treatment for a mental health diagnosis? Yes (Psychiatric medications)  Patient reports experiencing difficulty participating in SUD treatment: No    Most important reason(s) for this difficulty?    Has patient received prior services for treatment? No  In past, patient has received services from following agencies:    Plan of Care:  Suggested follow up at these agencies/treatment centers: IOP (Intensive Outpatient Program for [facility])  Other information: CPSS talked with Pt and discussed the options for Pt as of right now.  CPSS was made aware that Pt was hearing voices which stems from the substances. CPSS asked Pt if he wants to work on the substance use problem to help with the voices or Mental health concerns. CPSS stated that there are outpatient services that Pt can receive services from. CPSS left contact information for Pt so that he can continue to gain support after discharged.    Aaron Edelman Beonka Amesquita, CPSS  02/07/2017 1:34 PM

## 2017-02-07 NOTE — ED Provider Notes (Signed)
Patient has been accepted to [facility].  He is voluntary.  He currently has no complaints and appears stable for transfer.  Accepting physician is Dr. Clarita Leber, MD 02/07/17 2033

## 2017-02-07 NOTE — Progress Notes (Signed)
02/07/17 1357:  LRT went to pt room to offer activities, pt was sleep.    Victorino Sparrow, LRT/CTRS

## 2017-02-07 NOTE — ED Notes (Signed)
Patient denies SI/HI/VH. Patient reports AH. Plan of care discussed. Encouragement and support provided and safety maintain. Q 15 min safety checks remain in place and video monitoring.

## 2017-02-08 DIAGNOSIS — F250 Schizoaffective disorder, bipolar type: Principal | ICD-10-CM

## 2017-02-08 DIAGNOSIS — F909 Attention-deficit hyperactivity disorder, unspecified type: Secondary | ICD-10-CM | POA: Diagnosis present

## 2017-02-08 DIAGNOSIS — F4310 Post-traumatic stress disorder, unspecified: Secondary | ICD-10-CM | POA: Diagnosis present

## 2017-02-08 LAB — LIPID PANEL
Cholesterol: 184 mg/dL (ref 0–200)
HDL: 43 mg/dL (ref >40)
LDL Cholesterol: 96 mg/dL (ref 0–99)
Total CHOL/HDL Ratio: 4.3 RATIO
Triglycerides: 223 mg/dL — ABNORMAL HIGH (ref <150)
VLDL: 45 mg/dL — ABNORMAL HIGH (ref 0–40)

## 2017-02-08 LAB — HEMOGLOBIN A1C
HEMOGLOBIN A1C: 5 % (ref 4.8–5.6)
MEAN PLASMA GLUCOSE: 96.8 mg/dL

## 2017-02-08 LAB — TSH: TSH: 0.982 u[IU]/mL (ref 0.350–4.500)

## 2017-02-08 MED ORDER — LURASIDONE HCL 60 MG PO TABS
120.0000 mg | ORAL_TABLET | Freq: Every day | ORAL | Status: DC
  Administered 2017-02-08 – 2017-02-09 (×2): 120 mg via ORAL
  Filled 2017-02-08 (×3): qty 2

## 2017-02-08 NOTE — BHH Suicide Risk Assessment (Signed)
[facility] Admission Suicide Risk Assessment   Nursing information obtained from:    Demographic factors:    Current Mental Status:    Loss Factors:    Historical Factors:    Risk Reduction Factors:     Total Time spent with patient: 1 hour Principal Problem: Schizoaffective disorder, bipolar type ([place]) Diagnosis:   Patient Active Problem List   Diagnosis Date Noted  . Attention deficit hyperactivity disorder (ADHD) [F90.9] 02/08/2017  . PTSD (post-traumatic stress disorder) [F43.10] 02/08/2017  . Auditory hallucination [R44.0] 02/07/2017  . Schizoaffective disorder, bipolar type ([place]) [F25.0] 02/07/2017  . Alcohol use disorder, moderate, dependence ([place]) [F10.20] 02/07/2017  . Amphetamine use disorder, severe ([place]) [F15.20] 02/07/2017  . Psychosis ([place]) [F29] 02/07/2017  . Tinnitus of both ears [H93.13] 09/20/2016  . Raynaud's disease without gangrene [I73.00] 09/20/2016  . Polyarthralgia [M25.50] 09/20/2016  . Venereal disease contact [Z20.2] 09/20/2016  . Back pain [M54.9] 08/26/2016  . Bilateral hand swelling [M79.89] 08/26/2016  . Screen for STD (sexually transmitted disease) [Z11.3] 08/26/2016  . Fatigue [R53.83] 08/08/2016  . Dysphagia [R13.10] 08/03/2016  . Weight loss [R63.4] 08/03/2016  . Rash and nonspecific skin eruption [R21] 08/03/2016  . Constipation [K59.00] 07/01/2016  . Anxiety and depression [F41.9, F32.9] 07/01/2016  . Human immunodeficiency virus (HIV) disease ([place]) [B20] 02/26/2015  . Screening examination for venereal disease [Z11.3] 02/26/2015  . Encounter for long-term (current) use of medications [Z79.899] 02/26/2015  . Celiac disease [K90.0] 02/26/2015  . Seizure disorder ([place]) [G40.909] 02/26/2015   Subjective Data: psychotic break  Continued Clinical Symptoms:  Alcohol Use Disorder Identification Test Final Score (AUDIT): 3 The "Alcohol Use Disorders Identification Test", Guidelines for Use in Primary Care, Second Edition.  World Pharmacologist  [facility]). Score between 0-7:  no or low risk or alcohol related problems. Score between 8-15:  moderate risk of alcohol related problems. Score between 16-19:  high risk of alcohol related problems. Score 20 or above:  warrants further diagnostic evaluation for alcohol dependence and treatment.   CLINICAL FACTORS:   Bipolar Disorder:   Mixed State Currently Psychotic Medical Diagnoses and Treatments/Surgeries   Musculoskeletal: Strength & Muscle Tone: within normal limits Gait & Station: normal Patient leans: N/A  Psychiatric Specialty Exam: Physical Exam  Nursing note and vitals reviewed. Psychiatric: His speech is normal. Judgment normal. His mood appears anxious. He is withdrawn and actively hallucinating. Thought content is paranoid. Cognition and memory are normal.    Review of Systems  Neurological: Negative.   Psychiatric/Behavioral: Positive for hallucinations and substance abuse. The patient is nervous/anxious.   All other systems reviewed and are negative.   Blood pressure 104/72, pulse 62, temperature 98 F (36.7 C), temperature source Oral, resp. rate 18, height 6' (1.829 m), weight 69.9 kg (154 lb), SpO2 97 %.Body mass index is 20.89 kg/m.  General Appearance: Casual  Eye Contact:  Good  Speech:  Clear and Coherent  Volume:  Decreased  Mood:  Anxious  Affect:  Blunt  Thought Process:  Goal Directed and Descriptions of Associations: Intact  Orientation:  Full (Time, Place, and Person)  Thought Content:  Hallucinations: Auditory Command:  telling him he will never be better and to hurt himself  Suicidal Thoughts:  Yes.  without intent/plan  Homicidal Thoughts:  No  Memory:  Immediate;   Fair Recent;   Fair Remote;   Fair  Judgement:  Fair  Insight:  Fair  Psychomotor Activity:  Decreased  Concentration:  Concentration: Fair and Attention Span: Fair  Recall:  [place] of Knowledge:  Fair  Language:  Fair  Akathisia:  No  Handed:  Right  AIMS (if  indicated):     Assets:  Communication Skills Desire for Improvement Financial Resources/Insurance Housing Resilience Social Support Transportation Vocational/Educational  ADL's:  Intact  Cognition:  WNL  Sleep:  Number of Hours: 6.5      COGNITIVE FEATURES THAT CONTRIBUTE TO RISK:  None    SUICIDE RISK:   Moderate:  Frequent suicidal ideation with limited intensity, and duration, some specificity in terms of plans, no associated intent, good self-control, limited dysphoria/symptomatology, some risk factors present, and identifiable protective factors, including available and accessible social support.  PLAN OF CARE: Hospital admission, medication management, substance abuse counseling, discharge planning.  Mr. Sternberg is a 41 year old male with history of bipolar disorder with persistent auditory hallucinations admitted for worsening of his symptoms in the context of substance use.  #Mood and psychosis -increase Latuda to 120 mg daily  #PTSD -Consider Minipress  #ADHD -continue Tenex 1 mg dailt  #Insomnia -Trazodone 50 mg nightly  # HIV continue Triumeq  #Metabolic syndrome monitoring -Lipid panel, TSH and HgbA1C are pending -EKG pending  #Substance abuse -positive for amphetamines -Drinks alcohol excessively -monitor for symptoms of alcohol withdrawal -patient not motivated for change  #Disposition -Discharge to home -Follow up with Dr. Daron Offer    I certify that inpatient services furnished can reasonably be expected to improve the patient's condition.   Orson Slick, MD 02/08/2017, 9:33 AM

## 2017-02-08 NOTE — Progress Notes (Signed)
D: Patient denies SI/HI/AVH and pain. Patient states was feeling very sleepy during the day but is feeling better now. This Probation officer encouraged patient to get out of bed and go to dayroom for snacks and group.  A: Staff to monitor Q 15 mins for safety. Encouragement and support offered. Scheduled medications administered per orders. R: Patient remains safe on the unit. Patient attended group tonight. Patient visible on hte unit and interacting with peers. Patient taking administered medications.

## 2017-02-08 NOTE — Progress Notes (Signed)
Recreation Therapy Notes   Date: 10.31.18  Time: 3:00pm  Location: Outside  Behavioral response: N/A  Group Type: Leisure  Participation level: N/A  Communication: Patient did not attend group.  Comments: N/A  Necia Kamm LRT/CTRS        Blessing Zaucha 02/08/2017 4:05 PM

## 2017-02-08 NOTE — BHH Suicide Risk Assessment (Signed)
[place] INPATIENT:  Family/Significant Other Suicide Prevention Education  Suicide Prevention Education:  Education Completed; John Robbins, mother, [phone], has been identified by the patient as the family member/significant other with whom the patient will be residing, and identified as the person(s) who will aid the patient in the event of a mental health crisis (suicidal ideations/suicide attempt).  With written consent from the patient, the family member/significant other has been provided the following suicide prevention education, prior to the and/or following the discharge of the patient.  The suicide prevention education provided includes the following:  Suicide risk factors  Suicide prevention and interventions  National Suicide Hotline telephone number  [facility] assessment telephone number  [facility] Emergency Assistance [place] and/or Residential Mobile Crisis Unit telephone number  Request made of family/significant other to:  Remove weapons (e.g., guns, rifles, knives), all items previously/currently identified as safety concern.  Mother reports that pt does have a gun: she agreed to discuss this with pt and attempt to secure it.  Remove drugs/medications (over-the-counter, prescriptions, illicit drugs), all items previously/currently identified as a safety concern. Mother will look into medication in the home as well.  The family member/significant other verbalizes understanding of the suicide prevention education information provided.  The family member/significant other agrees to remove the items of safety concern listed above.  Jenny Reichmann reports that pt has been very much struggling lately with the voices.  She lives in [place] but has daily phone contact with pt.  To her best knowledge, pt began using meth in January because he was working and going to school and he said he needed it to get everything done.  Mother believes the voices  started after the meth use began.  Pt has been telling her that he is no longer using.  She is concerned and is supposed to go on vacation this Friday but may cancel due to all this.    Joanne Chars, LCSW 02/08/2017, 3:59 PM
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
## 2017-02-08 NOTE — H&P (Signed)
Psychiatric Admission Assessment Adult  Patient Identification: John Robbins MRN:  [id number] Date of Evaluation:  02/08/2017 Chief Complaint:  Bipolar Principal Diagnosis: Schizoaffective disorder, bipolar type ([place]) Diagnosis:   Patient Active Problem List   Diagnosis Date Noted  . Attention deficit hyperactivity disorder (ADHD) [F90.9] 02/08/2017  . PTSD (post-traumatic stress disorder) [F43.10] 02/08/2017  . Auditory hallucination [R44.0] 02/07/2017  . Schizoaffective disorder, bipolar type ([place]) [F25.0] 02/07/2017  . Alcohol use disorder, moderate, dependence ([place]) [F10.20] 02/07/2017  . Amphetamine use disorder, severe ([place]) [F15.20] 02/07/2017  . Psychosis ([place]) [F29] 02/07/2017  . Tinnitus of both ears [H93.13] 09/20/2016  . Raynaud's disease without gangrene [I73.00] 09/20/2016  . Polyarthralgia [M25.50] 09/20/2016  . Venereal disease contact [Z20.2] 09/20/2016  . Back pain [M54.9] 08/26/2016  . Bilateral hand swelling [M79.89] 08/26/2016  . Screen for STD (sexually transmitted disease) [Z11.3] 08/26/2016  . Fatigue [R53.83] 08/08/2016  . Dysphagia [R13.10] 08/03/2016  . Weight loss [R63.4] 08/03/2016  . Rash and nonspecific skin eruption [R21] 08/03/2016  . Constipation [K59.00] 07/01/2016  . Anxiety and depression [F41.9, F32.9] 07/01/2016  . Human immunodeficiency virus (HIV) disease ([place]) [B20] 02/26/2015  . Screening examination for venereal disease [Z11.3] 02/26/2015  . Encounter for long-term (current) use of medications [Z79.899] 02/26/2015  . Celiac disease [K90.0] 02/26/2015  . Seizure disorder [facility]) [G40.909] 02/26/2015   History of Present Illness:   Identifying data.  John Robbins is a 40 year old homosexual male with a history of psychosis.  Chief complaint.  "I have voices."  History of present.  Information was obtained from the patient and the chart.  The patient came to the ER complaining of worsening of auditory hallucinations and increased  paranoia.  His voices are commanding him to hurt himself and are very discouraging depending him that he will never feel better and will never be hallucinations free.  The patient reports some symptoms of depression and anxiety but he believes that they are all related to hallucination.  He started hallucinating at the beginning of the year.  In May he was prescribed Abilify and given Abilify injection.  The voices were better but he developed akathisia.  He was switched to Latuda 20 mg daily but that was recently increased to 40 mg.  He does not particularly useful but reports no side effects.  He is current stressors involve promotion at work, starting graduate school, and recent breakup. He denies symptoms suggestive of bipolar mania.  He reports nightmares and flashbacks of PTSD stemming from bad relationship.  He frequently feels anxious because of the voices.  He denies OCD symptoms.  He has been drinking "no more than usual".  He does not feel that he has drinking problem.  There were no DTs or alcohol withdrawal seizures.  He uses amphetamines once a week to address hallucinations.  Past psychiatric history.  He was diagnosed with depression and anxiety while in high school and was prescribed Zoloft, Wellbutrin and Melleril.  In May he was hospitalized in [place], diagnosed with bipolar and treated with Abilify. Brain MRI was recently performed to rule out brain involvement in the HIV process.  It was negative.  Family psychiatric history.  Mother with bipolar.  Social history.  He lives independently and works as a Financial planner.  Total Time spent with patient: 1 hour  Is the patient at risk to self? Yes.    Has the patient been a risk to self in the past 6 months? Yes.    Has the patient been  a risk to self within the distant past? No.  Is the patient a risk to others? No.  Has the patient been a risk to others in the past 6 months? No.  Has the patient been a risk to others within the  distant past? No.   Prior Inpatient Therapy:   Prior Outpatient Therapy:    Alcohol Screening: 1. How often do you have a drink containing alcohol?: Monthly or less 2. How many drinks containing alcohol do you have on a typical day when you are drinking?: 3 or 4 3. How often do you have six or more drinks on one occasion?: Less than monthly Preliminary Score: 2 4. How often during the last year have you found that you were not able to stop drinking once you had started?: Never 5. How often during the last year have you failed to do what was normally expected from you becasue of drinking?: Never 6. How often during the last year have you needed a first drink in the morning to get yourself going after a heavy drinking session?: Never 7. How often during the last year have you had a feeling of guilt of remorse after drinking?: Never 8. How often during the last year have you been unable to remember what happened the night before because you had been drinking?: Never 9. Have you or someone else been injured as a result of your drinking?: No 10. Has a relative or friend or a doctor or another health worker been concerned about your drinking or suggested you cut down?: No Alcohol Use Disorder Identification Test Final Score (AUDIT): 3 Brief Intervention: AUDIT score less than 7 or less-screening does not suggest unhealthy drinking-brief intervention not indicated Substance Abuse History in the last 12 months:  Yes.   Consequences of Substance Abuse: Negative Previous Psychotropic Medications: Yes  Psychological Evaluations: No  Past Medical History:  Past Medical History:  Diagnosis Date  . Allergy   . Anxiety   . Celiac disease   . History of substance abuse   . HIV infection ([place])   . Raynaud disease   . Seizures ([place])     Past Surgical History:  Procedure Laterality Date  . WISDOM TOOTH EXTRACTION     Family History:  Family History  Problem Relation Age of Onset  . Heart  disease Father 77  . Hypertension Father   . Heart disease Maternal Grandfather        early 33s  . Hypertension Maternal Grandfather   . Hypertension Mother   . Rheum arthritis Mother   . Crohn's disease Mother   . Crohn's disease Sister   . Rheum arthritis Maternal Grandmother   . Breast cancer Maternal Grandmother   . Colon cancer Paternal Grandmother   . Hypertension Paternal Grandfather   . Esophageal cancer Neg Hx   . Stomach cancer Neg Hx    Tobacco Screening: Have you used any form of tobacco in the last 30 days? (Cigarettes, Smokeless Tobacco, Cigars, and/or Pipes): No Social History:  History  Alcohol Use  . 1.2 oz/week  . 2 Glasses of wine per week    Comment: occ     History  Drug Use No    Comment: Hx of [place], ecstacy, GHB, ketamine, amephetamines, cocaine - nothing since 2009    Additional Social History:                           Allergies:  Allergies  Allergen Reactions  . Gluten Meal Other (See Comments)    Neurological   Lab Results:  Results for orders placed or performed during the hospital encounter of 02/07/17 (from the past 48 hour(s))  Lipid panel     Status: Abnormal   Collection Time: 02/08/17  6:38 AM  Result Value Ref Range   Cholesterol 184 0 - 200 mg/dL   Triglycerides 223 (H) <150 mg/dL   HDL 43 >40 mg/dL   Total CHOL/HDL Ratio 4.3 RATIO   VLDL 45 (H) 0 - 40 mg/dL   LDL Cholesterol 96 0 - 99 mg/dL    Comment:        Total Cholesterol/HDL:CHD Risk Coronary Heart Disease Risk Table                     Men   Women  1/2 Average Risk   3.4   3.3  Average Risk       5.0   4.4  2 X Average Risk   9.6   7.1  3 X Average Risk  23.4   11.0        Use the calculated Patient Ratio above and the CHD Risk Table to determine the patient's CHD Risk.        ATP III CLASSIFICATION (LDL):  <100     mg/dL   Optimal  100-129  mg/dL   Near or Above                    Optimal  130-159  mg/dL   Borderline  160-189  mg/dL    High  >190     mg/dL   Very High   TSH     Status: None   Collection Time: 02/08/17  6:38 AM  Result Value Ref Range   TSH 0.982 0.350 - 4.500 uIU/mL    Comment: Performed by a 3rd Generation assay with a functional sensitivity of <=0.01 uIU/mL.    Blood Alcohol level:  Lab Results  Component Value Date   ETH <10 57/84/6962    Metabolic Disorder Labs:  Lab Results  Component Value Date   HGBA1C 5.3 07/28/2016   No results found for: PROLACTIN Lab Results  Component Value Date   CHOL 184 02/08/2017   TRIG 223 (H) 02/08/2017   HDL 43 02/08/2017   CHOLHDL 4.3 02/08/2017   VLDL 45 (H) 02/08/2017   LDLCALC 96 02/08/2017   LDLCALC 96 11/30/2015    Current Medications: Current Facility-Administered Medications  Medication Dose Route Frequency Provider Last Rate Last Dose  . abacavir-dolutegravir-lamiVUDine (TRIUMEQ) [phone] MG per tablet 1 tablet  1 tablet Oral Daily Ethelene Hal, NP   1 tablet at 02/08/17 [phone]  . acetaminophen (TYLENOL) tablet 650 mg  650 mg Oral Q6H PRN Loann Chahal B, MD      . alum & mag hydroxide-simeth (MAALOX/MYLANTA) 200-200-20 MG/5ML suspension 30 mL  30 mL Oral Q4H PRN Bassheva Flury B, MD      . guanFACINE (TENEX) tablet 1 mg  1 mg Oral Daily Ethelene Hal, NP   1 mg at 02/08/17 0843  . hydrOXYzine (ATARAX/VISTARIL) tablet 25 mg  25 mg Oral TID Ethelene Hal, NP   25 mg at 02/08/17 0839  . Lurasidone HCl TABS 120 mg  120 mg Oral Q breakfast Chioma Mukherjee B, MD   120 mg at 02/08/17 0840  . magnesium hydroxide (MILK OF MAGNESIA) suspension 30 mL  30 mL Oral  Daily PRN Tanija Germani B, MD      . traZODone (DESYREL) tablet 50 mg  50 mg Oral QHS Ethelene Hal, NP       PTA Medications: Facility-Administered Medications Prior to Admission  Medication Dose Route Frequency Provider Last Rate Last Dose  . 0.9 %  sodium chloride infusion  500 mL Intravenous Continuous Milus Banister, MD        Prescriptions Prior to Admission  Medication Sig Dispense Refill Last Dose  . guanFACINE (TENEX) 1 MG tablet Take 1 tablet (1 mg total) by mouth 3 (three) times daily. (Patient taking differently: Take 1 mg by mouth See admin instructions. Takes at least 1 mg every day but can take up to 3 per day if he needs too) 270 tablet 1 02/05/2017 at Unknown time  . lurasidone (LATUDA) 40 MG TABS tablet Take 1 tablet (40 mg total) by mouth daily with breakfast. (Patient taking differently: Take 40 mg by mouth at bedtime. ) 30 tablet 1 02/05/2017 at Unknown time  . traZODone (DESYREL) 50 MG tablet Take 1 tablet (50 mg total) by mouth at bedtime. 90 tablet 1 02/05/2017 at Unknown time  . TRIUMEQ 600-50-300 MG tablet TAKE 1 TABLET BY MOUTH ONCE EVERY DAY 90 tablet 2 02/05/2017 at Unknown time  . Vitamin D, Cholecalciferol, 1000 units CAPS Take 1 capsule by mouth daily. (Patient not taking: Reported on 02/06/2017) 90 capsule 2 Not Taking at Unknown time    Musculoskeletal: Strength & Muscle Tone: within normal limits Gait & Station: normal Patient leans: N/A  Psychiatric Specialty Exam: Physical Exam  Nursing note and vitals reviewed. Constitutional: He is oriented to person, place, and time. He appears well-developed and well-nourished.  HENT:  Head: Normocephalic and atraumatic.  Eyes: Pupils are equal, round, and reactive to light. Conjunctivae and EOM are normal.  Neck: Normal range of motion. Neck supple.  Cardiovascular: Normal rate, regular rhythm and normal heart sounds.   Respiratory: Effort normal and breath sounds normal.  GI: Bowel sounds are normal.  Musculoskeletal: Normal range of motion.  Neurological: He is alert and oriented to person, place, and time.  Skin: Skin is warm and dry.  Psychiatric: His speech is normal. His mood appears anxious. His affect is blunt. He is actively hallucinating. Thought content is paranoid. Cognition and memory are normal. He expresses impulsivity. He  expresses suicidal ideation.    Review of Systems  Neurological: Negative.   Psychiatric/Behavioral: Positive for hallucinations and substance abuse. The patient is nervous/anxious.   All other systems reviewed and are negative.   Blood pressure 104/72, pulse 62, temperature 98 F (36.7 C), temperature source Oral, resp. rate 18, height 6' (1.829 m), weight 69.9 kg (154 lb), SpO2 97 %.Body mass index is 20.89 kg/m.  See SRA                                                  Sleep:  Number of Hours: 6.5    Treatment Plan Summary: Daily contact with patient to assess and evaluate symptoms and progress in treatment and Medication management   John Robbins is a 41 year old male with history of bipolar disorder with persistent auditory hallucinations admitted for worsening of his symptoms in the context of substance use.  #Mood and psychosis -increase Latuda to 120 mg daily  #PTSD -Consider Minipress  #ADHD -  continue Tenex 1 mg dailt  #Insomnia -Trazodone 50 mg nightly  # HIV continue Triumeq  #Metabolic syndrome monitoring -Lipid panel, TSH and HgbA1C are pending -EKG pending  #Substance abuse -positive for amphetamines -Drinks alcohol excessively -monitor for symptoms of alcohol withdrawal -patient not motivated for change  #Disposition -Discharge to home -Follow up with Dr. Daron Offer   Observation Level/Precautions:  15 minute checks  Laboratory:  CBC Chemistry Profile UDS UA  Psychotherapy:    Medications:    Consultations:    Discharge Concerns:    Estimated LOS:  Other:     Physician Treatment Plan for Primary Diagnosis: Schizoaffective disorder, bipolar type ([place]) Long Term Goal(s): Improvement in symptoms so as ready for discharge  Short Term Goals: Ability to identify changes in lifestyle to reduce recurrence of condition will improve, Ability to verbalize feelings will improve, Ability to disclose and discuss suicidal ideas,  Ability to demonstrate self-control will improve, Ability to identify and develop effective coping behaviors will improve and Ability to identify triggers associated with substance abuse/mental health issues will improve  Physician Treatment Plan for Secondary Diagnosis: Principal Problem:   Schizoaffective disorder, bipolar type ([place]) Active Problems:   Human immunodeficiency virus (HIV) disease ([place])   Amphetamine use disorder, severe ([facility])   Attention deficit hyperactivity disorder (ADHD)   PTSD (post-traumatic stress disorder)  Long Term Goal(s): Improvement in symptoms so as ready for discharge  Short Term Goals: Ability to identify changes in lifestyle to reduce recurrence of condition will improve, Ability to demonstrate self-control will improve and Ability to identify triggers associated with substance abuse/mental health issues will improve  I certify that inpatient services furnished can reasonably be expected to improve the patient's condition.    Orson Slick, MD 10/31/20189:53 AM

## 2017-02-08 NOTE — Progress Notes (Signed)
Recreation Therapy Notes  Date: 10.31.18  Time: 9:30am  Location: Craft Room  Behavioral response:Appropriate  Intervention Topic: Emotions  Discussion/Intervention: Group content on today was focused on emotions. The group identified what emotions are and why it is important to have emotions. Patients expressed some positive and negative emotions. Individuals gave some past experiences on how they normally dealt with emotions in the past. The group described some positive ways to deal with emotions in the future. Patients participated in the intervention "Shaded Emotions" where a story was read to the group as they colored in what feelings they were feeling into a coloring sheet.  Clinical Observations/Feedback:  Patient came to group and was engaged with peers and staff. Patient described emotions as a response to something. He expressed breathing as a positive way he handles his emotions. Individual made positive contributions to the group discussion and was on topic.   Xachary Hambly LRT/CTRS  Syaire Saber 02/08/2017 12:44 PM

## 2017-02-08 NOTE — Progress Notes (Signed)
  Patient rated his depression 7/10 and anxiety 2/10.Patient verbalized that the voices still there but not that bad.Patient attended some groups.Minimal interactions with peers and staff.Compliant with medications.Appetite and energy level good.Support and encouragement given.

## 2017-02-08 NOTE — Progress Notes (Signed)
Recreation Therapy Notes  INPATIENT RECREATION THERAPY ASSESSMENT  Patient Details Name: John Robbins MRN: [id number] DOB: [birth year] Today's Date: 02/08/2017  Patient Stressors: Other (Comment) (Hearing voices)  Patient states that his main stressor is hearing voices.  Coping Skills:   Talking  Personal Challenges: Communication, Concentration, Decision-Making, Expressing Yourself, Self-Esteem/Confidence, Social Interaction, Stress Management, Substance Abuse, Trusting Others  Leisure Interests (2+):  Exercise - Running  Awareness of Community Resources:  Yes  Community Resources:   None  Current Use: No  If no, Barriers?: Social  Patient Strengths:  Problem Solving, Not being over emotional  Patient Identified Areas of Improvement:  Not giving in to the [place] being rebellious to the voices  Current Recreation Participation:  Running  Patient Goal for Hospitalization:  Become better at [place] of Residence:  [place] of Residence:  [place]   Current [place] (including self-harm):  No  Current HI:  Yes  Consent to Intern Participation: N/A   [name] 02/08/2017, 2:38 PM

## 2017-02-08 NOTE — Tx Team (Addendum)
Interdisciplinary Treatment and Diagnostic Plan Update  02/08/2017 Time of Session: 10:30am John Robbins MRN: [id number]  Principal Diagnosis: Schizoaffective disorder, bipolar type ([place])  Secondary Diagnoses: Principal Problem:   Schizoaffective disorder, bipolar type ([place]) Active Problems:   Human immunodeficiency virus (HIV) disease ([place])   Amphetamine use disorder, severe (H[facility])   Attention deficit hyperactivity disorder (ADHD)   PTSD (post-traumatic stress disorder)   Current Medications:  Current Facility-Administered Medications  Medication Dose Route Frequency Provider Last Rate Last Dose  . abacavir-dolutegravir-lamiVUDine (TRIUMEQ) [phone] MG per tablet 1 tablet  1 tablet Oral Daily Ethelene Hal, NP   1 tablet at 02/08/17 [phone]  . acetaminophen (TYLENOL) tablet 650 mg  650 mg Oral Q6H PRN Pucilowska, Jolanta B, MD      . alum & mag hydroxide-simeth (MAALOX/MYLANTA) 200-200-20 MG/5ML suspension 30 mL  30 mL Oral Q4H PRN Pucilowska, Jolanta B, MD      . guanFACINE (TENEX) tablet 1 mg  1 mg Oral Daily Ethelene Hal, NP   1 mg at 02/08/17 0843  . hydrOXYzine (ATARAX/VISTARIL) tablet 25 mg  25 mg Oral TID Ethelene Hal, NP   25 mg at 02/08/17 0839  . Lurasidone HCl TABS 120 mg  120 mg Oral Q breakfast Pucilowska, Jolanta B, MD   120 mg at 02/08/17 0840  . magnesium hydroxide (MILK OF MAGNESIA) suspension 30 mL  30 mL Oral Daily PRN Pucilowska, Jolanta B, MD      . traZODone (DESYREL) tablet 50 mg  50 mg Oral QHS Ethelene Hal, NP       PTA Medications: Facility-Administered Medications Prior to Admission  Medication Dose Route Frequency Provider Last Rate Last Dose  . 0.9 %  sodium chloride infusion  500 mL Intravenous Continuous Milus Banister, MD       Prescriptions Prior to Admission  Medication Sig Dispense Refill Last Dose  . guanFACINE (TENEX) 1 MG tablet Take 1 tablet (1 mg total) by mouth 3 (three) times daily. (Patient taking  differently: Take 1 mg by mouth See admin instructions. Takes at least 1 mg every day but can take up to 3 per day if he needs too) 270 tablet 1 02/05/2017 at Unknown time  . lurasidone (LATUDA) 40 MG TABS tablet Take 1 tablet (40 mg total) by mouth daily with breakfast. (Patient taking differently: Take 40 mg by mouth at bedtime. ) 30 tablet 1 02/05/2017 at Unknown time  . traZODone (DESYREL) 50 MG tablet Take 1 tablet (50 mg total) by mouth at bedtime. 90 tablet 1 02/05/2017 at Unknown time  . TRIUMEQ 600-50-300 MG tablet TAKE 1 TABLET BY MOUTH ONCE EVERY DAY 90 tablet 2 02/05/2017 at Unknown time  . Vitamin D, Cholecalciferol, 1000 units CAPS Take 1 capsule by mouth daily. (Patient not taking: Reported on 02/06/2017) 90 capsule 2 Not Taking at Unknown time    Patient Stressors: Health problems Medication change or noncompliance Substance abuse  Patient Strengths: Motivation for treatment/growth Supportive family/friends  Treatment Modalities: Medication Management, Group therapy, Case management,  1 to 1 session with clinician, Psychoeducation, Recreational therapy.   Physician Treatment Plan for Primary Diagnosis: Schizoaffective disorder, bipolar type ([place]) Long Term Goal(s): Improvement in symptoms so as ready for discharge Improvement in symptoms so as ready for discharge   Short Term Goals: Ability to identify changes in lifestyle to reduce recurrence of condition will improve Ability to verbalize feelings will improve Ability to disclose and discuss suicidal ideas Ability to demonstrate self-control will  improve Ability to identify and develop effective coping behaviors will improve Ability to identify triggers associated with substance abuse/mental health issues will improve Ability to identify changes in lifestyle to reduce recurrence of condition will improve Ability to demonstrate self-control will improve Ability to identify triggers associated with substance abuse/mental  health issues will improve  Medication Management: Evaluate patient's response, side effects, and tolerance of medication regimen.  Therapeutic Interventions: 1 to 1 sessions, Unit Group sessions and Medication administration.  Evaluation of Outcomes: Not Progressing  Physician Treatment Plan for Secondary Diagnosis: Principal Problem:   Schizoaffective disorder, bipolar type ([place]) Active Problems:   Human immunodeficiency virus (HIV) disease ([place])   Amphetamine use disorder, severe (HCC)   Attention deficit hyperactivity disorder (ADHD)   PTSD (post-traumatic stress disorder)  Long Term Goal(s): Improvement in symptoms so as ready for discharge Improvement in symptoms so as ready for discharge   Short Term Goals: Ability to identify changes in lifestyle to reduce recurrence of condition will improve Ability to verbalize feelings will improve Ability to disclose and discuss suicidal ideas Ability to demonstrate self-control will improve Ability to identify and develop effective coping behaviors will improve Ability to identify triggers associated with substance abuse/mental health issues will improve Ability to identify changes in lifestyle to reduce recurrence of condition will improve Ability to demonstrate self-control will improve Ability to identify triggers associated with substance abuse/mental health issues will improve     Medication Management: Evaluate patient's response, side effects, and tolerance of medication regimen.  Therapeutic Interventions: 1 to 1 sessions, Unit Group sessions and Medication administration.  Evaluation of Outcomes: Not Progressing   RN Treatment Plan for Primary Diagnosis: Schizoaffective disorder, bipolar type ([place]) Long Term Goal(s): Knowledge of disease and therapeutic regimen to maintain health will improve  Short Term Goals: Ability to participate in decision making will improve, Ability to verbalize feelings will improve and Compliance  with prescribed medications will improve  Medication Management: RN will administer medications as ordered by provider, will assess and evaluate patient's response and provide education to patient for prescribed medication. RN will report any adverse and/or side effects to prescribing provider.  Therapeutic Interventions: 1 on 1 counseling sessions, Psychoeducation, Medication administration, Evaluate responses to treatment, Monitor vital signs and CBGs as ordered, Perform/monitor CIWA, COWS, AIMS and Fall Risk screenings as ordered, Perform wound care treatments as ordered.  Evaluation of Outcomes: Not Progressing   LCSW Treatment Plan for Primary Diagnosis: Schizoaffective disorder, bipolar type ([place]) Long Term Goal(s): Safe transition to appropriate next level of care at discharge, Engage patient in therapeutic group addressing interpersonal concerns.  Short Term Goals: Engage patient in aftercare planning with referrals and resources, Facilitate acceptance of mental health diagnosis and concerns and Increase skills for wellness and recovery  Therapeutic Interventions: Assess for all discharge needs, 1 to 1 time with Social worker, Explore available resources and support systems, Assess for adequacy in community support network, Educate family and significant other(s) on suicide prevention, Complete Psychosocial Assessment, Interpersonal group therapy.  Evaluation of Outcomes: Not Progressing   Progress in Treatment: Attending groups: No. Participating in groups: No. Taking medication as prescribed: Yes. Toleration medication: Yes. Family/Significant other contact made: No, will contact:    Patient understands diagnosis: Yes. Discussing patient identified problems/goals with staff: Yes. Medical problems stabilized or resolved: Yes. Denies suicidal/homicidal ideation: No. Issues/concerns per patient self-inventory: No. Other:    New problem(s) identified: No, Describe:     New  Short Term/Long Term Goal(s): Pt would not get  out of bed for to participate in treatment team.  Discharge Plan or Barriers: TBD  Reason for Continuation of Hospitalization: Hallucinations Medication stabilization Other; describe Coordination of aftercare  Estimated Length of Stay:5-7 days  Recreational Therapy: Patient Stressors: Other (Comment) (Hearing voices)  Patient states that his main stressor is hearing voices. Patient Goal: Patient will identify 3 positive coping skills to decrease stress x5 days.  Attendees: Patient: 02/08/2017 11:40 AM  Physician: Orson Slick 02/08/2017 11:40 AM  Nursing: Elige Radon, RN 02/08/2017 11:40 AM  RN Care Manager: 02/08/2017 11:40 AM  Social Worker: Dossie Arbour, LCSW 02/08/2017 11:40 AM  Recreational Therapist: Roanna Epley, LRT 02/08/2017 11:40 AM  Other:  02/08/2017 11:40 AM  Other:  02/08/2017 11:40 AM  Other: 02/08/2017 11:40 AM    Scribe for Treatment Team: August Saucer, LCSW 02/08/2017 11:40 AM

## 2017-02-08 NOTE — Tx Team (Signed)
Initial Treatment Plan 02/08/2017 2:50 AM John Robbins SENS POL:[id number]    PATIENT STRESSORS: Health problems Medication change or noncompliance Substance abuse   PATIENT STRENGTHS: Motivation for treatment/growth Supportive family/friends   PATIENT IDENTIFIED PROBLEMS: Substance Abuse     Depression     Psychosis ; auditory hallucination.              DISCHARGE CRITERIA:  Improved stabilization in mood, thinking, and/or behavior Motivation to continue treatment in a less acute level of care  PRELIMINARY DISCHARGE PLAN: Outpatient therapy  PATIENT/FAMILY INVOLVEMENT: This treatment plan has been presented to and reviewed with the patient, John Robbins, .  The patient and family have been given the opportunity to ask questions and make suggestions.  Harl Bowie, RN 02/08/2017, 2:50 AM

## 2017-02-08 NOTE — BHH Group Notes (Signed)
  [facility] LCSW Group Therapy Note  Date/Time: 02/08/17, 1300  Type of Therapy/Topic:  Group Therapy:  Emotion Regulation  Participation Level:  Active   Mood: pleasant  Description of Group:    The purpose of this group is to assist patients in learning to regulate negative emotions and experience positive emotions. Patients will be guided to discuss ways in which they have been vulnerable to their negative emotions. These vulnerabilities will be juxtaposed with experiences of positive emotions or situations, and patients challenged to use positive emotions to combat negative ones. Special emphasis will be placed on coping with negative emotions in conflict situations, and patients will process healthy conflict resolution skills.  Therapeutic Goals: 1. Patient will identify two positive emotions or experiences to reflect on in order to balance out negative emotions:  2. Patient will label two or more emotions that they find the most difficult to experience:  3. Patient will be able to demonstrate positive conflict resolution skills through discussion or role plays:   Summary of Patient Progress: Pt described "fear of loss, fear of exposure" as emotions that are difficult for him to experience.  Pt shared that mindfullness has been helpful for him to regulate difficult emotions.  Pt was attentive and made several comments during group dicussion.       Therapeutic Modalities:   Cognitive Behavioral Therapy Feelings Identification Dialectical Behavioral Therapy  Lurline Idol, LCSW

## 2017-02-08 NOTE — Progress Notes (Signed)
Admission Note:  41 yr male, alert and oriented x 4, presents IVC in no acute distress for the treatment of AVH, Anxiety and Depression. Pt appears flat and depressed, he was calm and cooperative with admission process. Pt presents with passive SI and contracts for safety upon admission. Pt endorses AVH, but stated "  voices are not command hallucination, just noises and racing thoughts" Pt has past medical Hx of Depression, Generalized anxiety disorder, HIV, Celiac disease and Raynaud disease. Patient's skin was assessed in presence of Matt RN and skin was found to have scattered tattoos in upper and lower torso, and some red spots around his upper back, no drainage noted. Patient was searched and no contraband found, POC and unit policies explained and understanding verbalized. consents obtained. Patient has a particular food preference gluten free because of celiac disease. Food and fluid was offered and both accepted. Pt had no additional questions or concerns, no distress noted will continue to monitor.

## 2017-02-08 NOTE — Plan of Care (Signed)
Problem: Activity: Goal: Interest or engagement in activities will improve Outcome: Progressing Patient attended groups.  Problem: Coping: Goal: Ability to verbalize frustrations and anger appropriately will improve Outcome: Progressing Patient denies suicidal ideation and AVH

## 2017-02-09 ENCOUNTER — Telehealth (HOSPITAL_COMMUNITY): Payer: Self-pay

## 2017-02-09 MED ORDER — FLUPHENAZINE HCL 5 MG PO TABS
5.0000 mg | ORAL_TABLET | Freq: Every day | ORAL | Status: DC
  Administered 2017-02-09: 5 mg via ORAL
  Filled 2017-02-09: qty 1

## 2017-02-09 MED ORDER — LURASIDONE HCL 40 MG PO TABS
80.0000 mg | ORAL_TABLET | Freq: Every day | ORAL | Status: DC
  Filled 2017-02-09: qty 2

## 2017-02-09 MED ORDER — LURASIDONE HCL 60 MG PO TABS
120.0000 mg | ORAL_TABLET | Freq: Every day | ORAL | Status: DC
  Administered 2017-02-10: 120 mg via ORAL
  Filled 2017-02-09: qty 2

## 2017-02-09 NOTE — Plan of Care (Signed)
Problem: Activity: Goal: Interest or engagement in activities will improve Outcome: Progressing Patient attends group activities.  Problem: Coping: Goal: Ability to verbalize frustrations and anger appropriately will improve Outcome: Not Progressing Patient does not verbalize his feelings very much with staff.

## 2017-02-09 NOTE — Progress Notes (Signed)
Patients affect is brighter on approach.Denies SI,HI and AVH.Attended groups.Appropriate with staff & peers.Compliant with medications.Appetite and energy level good.Support and encouragement given.

## 2017-02-09 NOTE — Progress Notes (Signed)
Recreation Therapy Notes  Date: 11.01.18  Time: 3:00pm  Location: Outside  Behavioral response: N/A  Group Type: Leisure  Participation level: N/A  Communication: Patient did not attend group.  Comments: N/A  Donyell Carrell LRT/CTRS        Foye Damron 02/09/2017 4:13 PM

## 2017-02-09 NOTE — BHH Group Notes (Signed)
[facility] LCSW Group Therapy Note  Date/Time: 02/09/17, 0930  Type of Therapy/Topic:  Group Therapy:  Balance in Life  Participation Level:    Description of Group:    This group will address the concept of balance and how it feels and looks when one is unbalanced. Patients will be encouraged to process areas in their lives that are out of balance, and identify reasons for remaining unbalanced. Facilitators will guide patients utilizing problem- solving interventions to address and correct the stressor making their life unbalanced. Understanding and applying boundaries will be explored and addressed for obtaining  and maintaining a balanced life. Patients will be encouraged to explore ways to assertively make their unbalanced needs known to significant others in their lives, using other group members and facilitator for support and feedback.  Therapeutic Goals: 1. Patient will identify two or more emotions or situations they have that consume much of in their lives. 2. Patient will identify signs/triggers that life has become out of balance:  3. Patient will identify two ways to set boundaries in order to achieve balance in their lives:  4. Patient will demonstrate ability to communicate their needs through discussion and/or role plays  Summary of Patient Progress: Pt identified physical (substance use), friends, and creative as areas of his life that are out of balance. Pt was attentive and made several comments during group discussion regarding ways to try to address the identified areas positively.          Therapeutic Modalities:   Cognitive Behavioral Therapy Solution-Focused Therapy Assertiveness Training  Lurline Idol, [place]

## 2017-02-09 NOTE — Telephone Encounter (Signed)
Patient mom called asking for Dr. Daron Offer to call her back. She wants his recommendation on the best treatment for her son.

## 2017-02-09 NOTE — Progress Notes (Signed)
[facility] MD Progress Note  02/09/2017 1:35 PM John Robbins  MRN:  [id number]  Subjective:   John Robbins is resting in bed this morning. Higher dose of Latuda makes him sleepy but has been helpful for hallucinations. He is asking to switch it to night time. He would like additional dose of Latuda tonight.  Treatment plan. We will continue Latuda 120 mg nightly.  Social/disposition. He will be discharged to his apartment. Follow up with Dr. Daron Offer.  Principal Problem: Schizoaffective disorder, bipolar type ([place]) Diagnosis:   Patient Active Problem List   Diagnosis Date Noted  . Attention deficit hyperactivity disorder (ADHD) [F90.9] 02/08/2017  . PTSD (post-traumatic stress disorder) [F43.10] 02/08/2017  . Auditory hallucination [R44.0] 02/07/2017  . Schizoaffective disorder, bipolar type ([place]) [F25.0] 02/07/2017  . Alcohol use disorder, moderate, dependence ([employer]) [F10.20] 02/07/2017  . Amphetamine use disorder, severe ([place]) [F15.20] 02/07/2017  . Psychosis ([place]) [F29] 02/07/2017  . Tinnitus of both ears [H93.13] 09/20/2016  . Raynaud's disease without gangrene [I73.00] 09/20/2016  . Polyarthralgia [M25.50] 09/20/2016  . Venereal disease contact [Z20.2] 09/20/2016  . Back pain [M54.9] 08/26/2016  . Bilateral hand swelling [M79.89] 08/26/2016  . Screen for STD (sexually transmitted disease) [Z11.3] 08/26/2016  . Fatigue [R53.83] 08/08/2016  . Dysphagia [R13.10] 08/03/2016  . Weight loss [R63.4] 08/03/2016  . Rash and nonspecific skin eruption [R21] 08/03/2016  . Constipation [K59.00] 07/01/2016  . Anxiety and depression [F41.9, F32.9] 07/01/2016  . Human immunodeficiency virus (HIV) disease ([place]) [B20] 02/26/2015  . Screening examination for venereal disease [Z11.3] 02/26/2015  . Encounter for long-term (current) use of medications [Z79.899] 02/26/2015  . Celiac disease [K90.0] 02/26/2015  . Seizure disorder ([place]) [G40.909] 02/26/2015   Total Time spent with patient: 20  minutes  Past Psychiatric History: bipolar illness.  Past Medical History:  Past Medical History:  Diagnosis Date  . Allergy   . Anxiety   . Celiac disease   . History of substance abuse   . HIV infection ([place])   . Raynaud disease   . Seizures ([place])     Past Surgical History:  Procedure Laterality Date  . WISDOM TOOTH EXTRACTION     Family History:  Family History  Problem Relation Age of Onset  . Heart disease Father 30  . Hypertension Father   . Heart disease Maternal Grandfather        early 39s  . Hypertension Maternal Grandfather   . Hypertension Mother   . Rheum arthritis Mother   . Crohn's disease Mother   . Crohn's disease Sister   . Rheum arthritis Maternal Grandmother   . Breast cancer Maternal Grandmother   . Colon cancer Paternal Grandmother   . Hypertension Paternal Grandfather   . Esophageal cancer Neg Hx   . Stomach cancer Neg Hx    Family Psychiatric  History: bipolar. Social History:  History  Alcohol Use  . 1.2 oz/week  . 2 Glasses of wine per week    Comment: occ     History  Drug Use No    Comment: Hx of maijuana, ecstacy, GHB, ketamine, amephetamines, cocaine - nothing since 2009    Social History   Social History  . Marital status: Unknown    Spouse name: N/A  . Number of children: N/A  . Years of education: N/A   Social History Main Topics  . Smoking status: Never Smoker  . Smokeless tobacco: Never Used  . Alcohol use 1.2 oz/week    2 Glasses of wine per week  Comment: occ  . Drug use: No     Comment: Hx of maijuana, ecstacy, GHB, ketamine, amephetamines, cocaine - nothing since 2009  . Sexual activity: Yes    Partners: Male     Comment: Uses Condoms   Other Topics Concern  . None   Social History Narrative   Works in Engineer, technical sales.   Additional Social History:                         Sleep: Fair  Appetite:  Fair  Current Medications: Current Facility-Administered Medications  Medication Dose Route Frequency  Provider Last Rate Last Dose  . abacavir-dolutegravir-lamiVUDine (TRIUMEQ) 706-23-762 MG per tablet 1 tablet  1 tablet Oral Daily Ethelene Hal, NP   1 tablet at 02/09/17 0758  . acetaminophen (TYLENOL) tablet 650 mg  650 mg Oral Q6H PRN Chalsey Leeth B, MD      . alum & mag hydroxide-simeth (MAALOX/MYLANTA) 200-200-20 MG/5ML suspension 30 mL  30 mL Oral Q4H PRN Kiauna Zywicki B, MD      . guanFACINE (TENEX) tablet 1 mg  1 mg Oral Daily Ethelene Hal, NP   1 mg at 02/09/17 0758  . hydrOXYzine (ATARAX/VISTARIL) tablet 25 mg  25 mg Oral TID Ethelene Hal, NP   25 mg at 02/09/17 1205  . Lurasidone HCl TABS 120 mg  120 mg Oral Q breakfast Judianne Seiple B, MD   120 mg at 02/09/17 0758  . magnesium hydroxide (MILK OF MAGNESIA) suspension 30 mL  30 mL Oral Daily PRN Francille Wittmann B, MD      . traZODone (DESYREL) tablet 50 mg  50 mg Oral QHS Ethelene Hal, NP   50 mg at 02/08/17 2143    Lab Results:  Results for orders placed or performed during the hospital encounter of 02/07/17 (from the past 48 hour(s))  Hemoglobin A1c     Status: None   Collection Time: 02/08/17  6:38 AM  Result Value Ref Range   Hgb A1c MFr Bld 5.0 4.8 - 5.6 %    Comment: (NOTE) Pre diabetes:          5.7%-6.4% Diabetes:              >6.4% Glycemic control for   <7.0% adults with diabetes    Mean Plasma Glucose 96.8 mg/dL    Comment: Performed at [facility], [place] [address] [place], [place] [address]  Lipid panel     Status: Abnormal   Collection Time: 02/08/17  6:38 AM  Result Value Ref Range   Cholesterol 184 0 - 200 mg/dL   Triglycerides 223 (H) <150 mg/dL   HDL 43 >40 mg/dL   Total CHOL/HDL Ratio 4.3 RATIO   VLDL 45 (H) 0 - 40 mg/dL   LDL Cholesterol 96 0 - 99 mg/dL    Comment:        Total Cholesterol/HDL:CHD Risk Coronary Heart Disease Risk Table                     Men   Women  1/2 Average Risk   3.4   3.3  Average Risk       5.0   4.4  2 X  Average Risk   9.6   7.1  3 X Average Risk  23.4   11.0        Use the calculated Patient Ratio above and the CHD Risk Table to determine the  patient's CHD Risk.        ATP III CLASSIFICATION (LDL):  <100     mg/dL   Optimal  100-129  mg/dL   Near or Above                    Optimal  130-159  mg/dL   Borderline  160-189  mg/dL   High  >190     mg/dL   Very High   TSH     Status: None   Collection Time: 02/08/17  6:38 AM  Result Value Ref Range   TSH 0.982 0.350 - 4.500 uIU/mL    Comment: Performed by a 3rd Generation assay with a functional sensitivity of <=0.01 uIU/mL.    Blood Alcohol level:  Lab Results  Component Value Date   ETH <10 83/66/2947    Metabolic Disorder Labs: Lab Results  Component Value Date   HGBA1C 5.0 02/08/2017   MPG 96.8 02/08/2017   No results found for: PROLACTIN Lab Results  Component Value Date   CHOL 184 02/08/2017   TRIG 223 (H) 02/08/2017   HDL 43 02/08/2017   CHOLHDL 4.3 02/08/2017   VLDL 45 (H) 02/08/2017   LDLCALC 96 02/08/2017   LDLCALC 96 11/30/2015    Physical Findings: AIMS: Facial and Oral Movements Muscles of Facial Expression: None, normal Lips and Perioral Area: None, normal Jaw: None, normal Tongue: None, normal,Extremity Movements Upper (arms, wrists, hands, fingers): None, normal Lower (legs, knees, ankles, toes): None, normal, Trunk Movements Neck, shoulders, hips: None, normal, Overall Severity Severity of abnormal movements (highest score from questions above): None, normal Incapacitation due to abnormal movements: None, normal Patient's awareness of abnormal movements (rate only patient's report): No Awareness, Dental Status Current problems with teeth and/or dentures?: No Does patient usually wear dentures?: No  CIWA:  CIWA-Ar Total: 0 COWS:  COWS Total Score: 0  Musculoskeletal: Strength & Muscle Tone: within normal limits Gait & Station: normal Patient leans: N/A  Psychiatric Specialty  Exam: Physical Exam  Nursing note and vitals reviewed. Psychiatric: His speech is normal. Thought content normal. His mood appears anxious. His affect is blunt. He is withdrawn and actively hallucinating. Cognition and memory are normal. He expresses impulsivity. He exhibits a depressed mood.    Review of Systems  Neurological: Negative.   Psychiatric/Behavioral: Positive for hallucinations.  All other systems reviewed and are negative.   Blood pressure 105/66, pulse (!) 57, temperature 97.7 F (36.5 C), temperature source Oral, resp. rate 18, height 6' (1.829 m), weight 69.9 kg (154 lb), SpO2 97 %.Body mass index is 20.89 kg/m.  General Appearance: Casual  Eye Contact:  Good  Speech:  Clear and Coherent  Volume:  Decreased  Mood:  Anxious and Depressed  Affect:  Blunt  Thought Process:  Goal Directed and Descriptions of Associations: Intact  Orientation:  Full (Time, Place, and Person)  Thought Content:  Hallucinations: Auditory  Suicidal Thoughts:  No  Homicidal Thoughts:  No  Memory:  Immediate;   Fair Recent;   Fair Remote;   Fair  Judgement:  Fair  Insight:  Fair  Psychomotor Activity:  Decreased  Concentration:  Concentration: Fair and Attention Span: Fair  Recall:  [employer] of Knowledge:  Fair  Language:  Fair  Akathisia:  No  Handed:  Right  AIMS (if indicated):     Assets:  Communication Skills Desire for Improvement Financial Resources/Insurance Housing Resilience Social Support Transportation Vocational/Educational  ADL's:  Intact  Cognition:  WNL  Sleep:  Number of Hours: 7.75     Treatment Plan Summary: Daily contact with patient to assess and evaluate symptoms and progress in treatment and Medication management   Mr. Schoffstall is a 41 year old male with history of bipolar disorder with persistent auditory hallucinations admitted for worsening of his symptoms in the context of substance use.  #Mood and psychosis -switch Latuda to nightime due to  somnol;ence  -additional dose of Latuda 80 mg tonight  #PTSD -Consider Minipress but BP low   #ADHD -continue Tenex 1 mg daily  #Insomnia -Trazodone 50 mg nightly  # HIV continue Triumeq  #Metabolic syndrome monitoring -Lipid panel, TSH and HgbA1C are normal -EKG, QTc 448  #Substance abuse -positive for amphetamines -Drinks alcohol excessively -monitor for symptoms of alcohol withdrawal -patient not motivated for change  #Disposition -Discharge to home -Follow up with Dr. Assunta Gambles, MD 02/09/2017, 1:35 PM

## 2017-02-09 NOTE — B[facility] Group Notes (Signed)
Goals Group Date/Time: 02/09/2017 9:00 AM Type of Therapy and Topic: Group Therapy: Goals Group: SMART Goals   Participation Level: Moderate  Description of Group:    The purpose of a daily goals group is to assist and guide patients in setting recovery/wellness-related goals. The objective is to set goals as they relate to the crisis in which they were admitted. Patients will be using SMART goal modalities to set measurable goals. Characteristics of realistic goals will be discussed and patients will be assisted in setting and processing how one will reach their goal. Facilitator will also assist patients in applying interventions and coping skills learned in psycho-education groups to the SMART goal and process how one will achieve defined goal.   Therapeutic Goals:   -Patients will develop and document one goal related to or their crisis in which brought them into treatment.  -Patients will be guided by LCSW using SMART goal setting modality in how to set a measurable, attainable, realistic and time sensitive goal.  -Patients will process barriers in reaching goal.  -Patients will process interventions in how to overcome and successful in reaching goal.   Patient's Goal: Staying awake, I.e.:Talking to MD about feeling sedated, going to groups.   Therapeutic Modalities:  Motivational Interviewing  Art gallery manager  SMART goals setting   Lurline Idol, [place]

## 2017-02-09 NOTE — Plan of Care (Signed)
Problem: Coping: Goal: Ability to verbalize frustrations and anger appropriately will improve Outcome: Progressing Patient denies SI/AVH

## 2017-02-09 NOTE — BH Assessment (Signed)
Pt. Is here voluntary.  Pt. Hx of hearing voices.  Pt. Denies hearing voices today.  Pt. Appeared calm and cooperative.  Pt. Denies SI or HI.  Pt. Reports feeling better today.  Pt. States adjusting to medications ok.  Pt. Reports speaking with doctor today.  Pt. Also reports going to group today and it was productive.  Pt. Had no questions for this nurse at this time, but felt he would be able to talk to me or other nurses if needed.

## 2017-02-09 NOTE — Plan of Care (Signed)
Problem: Education: Goal: Utilization of techniques to improve thought processes will improve Outcome: Progressing Patient able to make short term goal for next day.

## 2017-02-09 NOTE — Progress Notes (Signed)
Recreation Therapy Notes   Date: 11.01.18   Time: 1:00pm    Location: Craft Room   Behavioral response: None   Intervention Topic: Values   Discussion/Intervention: Patient did not attend group.   Clinical Observations/Feedback:  Patient did not attend group.   Caralynn Gelber LRT/CTRS        Nyashia Raney 02/09/2017 1:51 PM

## 2017-02-09 NOTE — BHH Group Notes (Signed)
[facility] Group Notes:  (Nursing/MHT/Case Management/Adjunct)  Date:  02/09/2017  Time:  6:59 PM  Type of Therapy:  Psychoeducational Skills  Participation Level:  Did Not Attend   Elby Blackwelder A Zanyia Silbaugh 02/09/2017, 6:59 PM

## 2017-02-10 MED ORDER — FLUPHENAZINE HCL 5 MG PO TABS: 5.0000 mg | ORAL_TABLET | Freq: Every evening | ORAL | Status: DC | PRN

## 2017-02-10 NOTE — Telephone Encounter (Signed)
I informed patients' mom of what Dr. Daron Offer stated about her coming to the next appt with her son. Mom said she wanted to know what the patient should do after he is released from the hospital. She wants to know Dr. Joycelyn Schmid personal recommendation for what they should do next. Should he go back to work or what? Please review and advise.

## 2017-02-10 NOTE — Telephone Encounter (Signed)
I would suggest he start in the intensive outpatient program here for chemical dependence treatment, it is 5 days per week, and our team will support him to take care of necessary paperwork for FMLA, or short-term disability so that he can be out of the work for the time being.  They should call our front desk and get connected with Lelon Frohlich or Wes for an initial intake assessment to start the program once he is discharged.

## 2017-02-10 NOTE — Tx Team (Signed)
Interdisciplinary Treatment and Diagnostic Plan Update  02/10/2017 Time of Session: 10:55am John Robbins MRN: [id number]  Principal Diagnosis: Schizoaffective disorder, bipolar type ([place])  Secondary Diagnoses: Principal Problem:   Schizoaffective disorder, bipolar type ([place]) Active Problems:   Human immunodeficiency virus (HIV) disease ([place])   Amphetamine use disorder, severe ([facility])   Attention deficit hyperactivity disorder (ADHD)   PTSD (post-traumatic stress disorder)   Current Medications:  Current Facility-Administered Medications  Medication Dose Route Frequency Provider Last Rate Last Dose  . abacavir-dolutegravir-lamiVUDine (TRIUMEQ) 482-50-037 MG per tablet 1 tablet  1 tablet Oral Daily Ethelene Hal, NP   1 tablet at 02/10/17 [phone]  . acetaminophen (TYLENOL) tablet 650 mg  650 mg Oral Q6H PRN Pucilowska, Jolanta B, MD      . alum & mag hydroxide-simeth (MAALOX/MYLANTA) 200-200-20 MG/5ML suspension 30 mL  30 mL Oral Q4H PRN Pucilowska, Jolanta B, MD      . fluPHENAZine (PROLIXIN) tablet 5 mg  5 mg Oral QHS Pucilowska, Jolanta B, MD   5 mg at 02/09/17 2145  . guanFACINE (TENEX) tablet 1 mg  1 mg Oral Daily Ethelene Hal, NP   1 mg at 02/10/17 8916  . hydrOXYzine (ATARAX/VISTARIL) tablet 25 mg  25 mg Oral TID Ethelene Hal, NP   25 mg at 02/10/17 0814  . Lurasidone HCl TABS 120 mg  120 mg Oral QHS Pucilowska, Jolanta B, MD      . magnesium hydroxide (MILK OF MAGNESIA) suspension 30 mL  30 mL Oral Daily PRN Pucilowska, Jolanta B, MD      . traZODone (DESYREL) tablet 50 mg  50 mg Oral QHS Ethelene Hal, NP   50 mg at 02/09/17 2145   PTA Medications: Facility-Administered Medications Prior to Admission  Medication Dose Route Frequency Provider Last Rate Last Dose  . 0.9 %  sodium chloride infusion  500 mL Intravenous Continuous Milus Banister, MD       Prescriptions Prior to Admission  Medication Sig Dispense Refill Last Dose  . guanFACINE  (TENEX) 1 MG tablet Take 1 tablet (1 mg total) by mouth 3 (three) times daily. (Patient taking differently: Take 1 mg by mouth See admin instructions. Takes at least 1 mg every day but can take up to 3 per day if he needs too) 270 tablet 1 02/05/2017 at Unknown time  . lurasidone (LATUDA) 40 MG TABS tablet Take 1 tablet (40 mg total) by mouth daily with breakfast. (Patient taking differently: Take 40 mg by mouth at bedtime. ) 30 tablet 1 02/05/2017 at Unknown time  . traZODone (DESYREL) 50 MG tablet Take 1 tablet (50 mg total) by mouth at bedtime. 90 tablet 1 02/05/2017 at Unknown time  . TRIUMEQ 600-50-300 MG tablet TAKE 1 TABLET BY MOUTH ONCE EVERY DAY 90 tablet 2 02/05/2017 at Unknown time  . Vitamin D, Cholecalciferol, 1000 units CAPS Take 1 capsule by mouth daily. (Patient not taking: Reported on 02/06/2017) 90 capsule 2 Not Taking at Unknown time    Patient Stressors: Health problems Medication change or noncompliance Substance abuse  Patient Strengths: Motivation for treatment/growth Supportive family/friends  Treatment Modalities: Medication Management, Group therapy, Case management,  1 to 1 session with clinician, Psychoeducation, Recreational therapy.   Physician Treatment Plan for Primary Diagnosis: Schizoaffective disorder, bipolar type ([place]) Long Term Goal(s): Improvement in symptoms so as ready for discharge Improvement in symptoms so as ready for discharge   Short Term Goals: Ability to identify changes in lifestyle to reduce recurrence  of condition will improve Ability to verbalize feelings will improve Ability to disclose and discuss suicidal ideas Ability to demonstrate self-control will improve Ability to identify and develop effective coping behaviors will improve Ability to identify triggers associated with substance abuse/mental health issues will improve Ability to identify changes in lifestyle to reduce recurrence of condition will improve Ability to demonstrate  self-control will improve Ability to identify triggers associated with substance abuse/mental health issues will improve  Medication Management: Evaluate patient's response, side effects, and tolerance of medication regimen.  Therapeutic Interventions: 1 to 1 sessions, Unit Group sessions and Medication administration.  Evaluation of Outcomes: Progressing  Physician Treatment Plan for Secondary Diagnosis: Principal Problem:   Schizoaffective disorder, bipolar type ([place]) Active Problems:   Human immunodeficiency virus (HIV) disease ([place])   Amphetamine use disorder, severe ([facility])   Attention deficit hyperactivity disorder (ADHD)   PTSD (post-traumatic stress disorder)  Long Term Goal(s): Improvement in symptoms so as ready for discharge Improvement in symptoms so as ready for discharge   Short Term Goals: Ability to identify changes in lifestyle to reduce recurrence of condition will improve Ability to verbalize feelings will improve Ability to disclose and discuss suicidal ideas Ability to demonstrate self-control will improve Ability to identify and develop effective coping behaviors will improve Ability to identify triggers associated with substance abuse/mental health issues will improve Ability to identify changes in lifestyle to reduce recurrence of condition will improve Ability to demonstrate self-control will improve Ability to identify triggers associated with substance abuse/mental health issues will improve     Medication Management: Evaluate patient's response, side effects, and tolerance of medication regimen.  Therapeutic Interventions: 1 to 1 sessions, Unit Group sessions and Medication administration.  Evaluation of Outcomes: Progressing   RN Treatment Plan for Primary Diagnosis: Schizoaffective disorder, bipolar type ([place]) Long Term Goal(s): Knowledge of disease and therapeutic regimen to maintain health will improve  Short Term Goals: Ability to participate in  decision making will improve, Ability to verbalize feelings will improve and Compliance with prescribed medications will improve  Medication Management: RN will administer medications as ordered by provider, will assess and evaluate patient's response and provide education to patient for prescribed medication. RN will report any adverse and/or side effects to prescribing provider.  Therapeutic Interventions: 1 on 1 counseling sessions, Psychoeducation, Medication administration, Evaluate responses to treatment, Monitor vital signs and CBGs as ordered, Perform/monitor CIWA, COWS, AIMS and Fall Risk screenings as ordered, Perform wound care treatments as ordered.  Evaluation of Outcomes: Progressing   LCSW Treatment Plan for Primary Diagnosis: Schizoaffective disorder, bipolar type ([place]) Long Term Goal(s): Safe transition to appropriate next level of care at discharge, Engage patient in therapeutic group addressing interpersonal concerns.  Short Term Goals: Engage patient in aftercare planning with referrals and resources, Facilitate acceptance of mental health diagnosis and concerns and Increase skills for wellness and recovery  Therapeutic Interventions: Assess for all discharge needs, 1 to 1 time with Social worker, Explore available resources and support systems, Assess for adequacy in community support network, Educate family and significant other(s) on suicide prevention, Complete Psychosocial Assessment, Interpersonal group therapy.  Evaluation of Outcomes: Progressing   Progress in Treatment: Attending groups: Yes Participating in groups: Yes Taking medication as prescribed: Yes. Toleration medication: Yes. Family/Significant other contact made: Yes: mother Patient understands diagnosis: Yes. Discussing patient identified problems/goals with staff: Yes. Medical problems stabilized or resolved: Yes. Denies suicidal/homicidal ideation: No. Issues/concerns per patient self-inventory:  No. Other:    New problem(s) identified: No,  Describe:     New Short Term/Long Term Goal(s): Pt would not get out of bed for to participate in treatment team.  Discharge Plan or Barriers: TBD  Reason for Continuation of Hospitalization: Hallucinations Medication stabilization Other; describe Coordination of aftercare  Estimated Length of Stay:1 days  Recreational Therapy: Patient Stressors: Other (Comment) (Hearing voices)  Patient states that his main stressor is hearing voices. Patient Goal: Patient will identify 3 positive coping skills to decrease stress x5 days.   Attendees: Patient:John Robbins   Physician: Dr. Bary Leriche, MD 02/10/17   Nursing: Elige Radon, RN 02/10/17  RN Care Manager: 02/10/17   Social Worker: Lurline Idol, LCSW 02/10/17  Recreational Therapist: Roanna Epley, LRT, CTRS 02/10/17  Other:    Other:    Other:       Scribe for Treatment Team: Joanne Chars, [place] 02/10/2017 12:07 PM

## 2017-02-10 NOTE — Plan of Care (Signed)
Problem: Activity: Goal: Interest or engagement in leisure activities will improve Outcome: Progressing Patient shows interest in leisure activities Goal: Imbalance in normal sleep/wake cycle will improve Outcome: Progressing It has been reported that patient sleeps at least 6 hours during the night.  Problem: Education: Goal: Knowledge of the prescribed therapeutic regimen will improve Outcome: Progressing Patient is knowledgeable of therapeutic regimen.  Problem: Health Behavior/Discharge Planning: Goal: Ability to make decisions will improve Outcome: Progressing Patient shows the ability to make some decisions on his own. Goal: Compliance with therapeutic regimen will improve Outcome: Progressing Patient is compliant with his therapeutic regimen.  Problem: Role Relationship: Goal: Ability to demonstrate positive changes in social behaviors and relationships will improve Outcome: Progressing Patient has the ability to demonstrate positive changes in social behaviors.  Problem: Safety: Goal: Ability to disclose and discuss suicidal ideas will improve Outcome: Progressing Patient has the ability to discuss and disclose suicidal ideas.

## 2017-02-10 NOTE — [facility] Group Notes (Signed)
[place] Group Notes:  (Nursing/MHT/Case Management/Adjunct)  Date:  02/10/2017  Time:  4:39 AM  Type of Therapy:  Psychoeducational Skills  Participation Level:  Did Not Attend   Summary of Progress/Problems:  Reece Agar 02/10/2017, 4:39 AM

## 2017-02-10 NOTE — Telephone Encounter (Signed)
He will see me for follow-up in about 13 days and im happy to discuss further, she is welcome to join him at the visit

## 2017-02-10 NOTE — Progress Notes (Signed)
[facility] MD Progress Note  02/10/2017 12:09 PM Braydyn RIOT WATERWORTH  MRN:  [id number]  Subjective:   Mr. Tavella met with treatment team. He denies auditory hallucinations. He did not sleep well last night. His mother contactesd his outpatient provider worried about amphetamine use. The patient is ambivalent about IOP and would prefer to work with his therapist.  Treatment plan. We will continue latuda 120 mg nightly and Trazodone for hallucinations and sleep. Continue Tenex for ADHD.  Social/disposition. He will be discharged to home. Follow up with Dr. Daron Offer and his therapist. We recommend SA IOP program participation for amphetamine addiction.  Principal Problem: Schizoaffective disorder, bipolar type ([place]) Diagnosis:   Patient Active Problem List   Diagnosis Date Noted  . Attention deficit hyperactivity disorder (ADHD) [F90.9] 02/08/2017  . PTSD (post-traumatic stress disorder) [F43.10] 02/08/2017  . Auditory hallucination [R44.0] 02/07/2017  . Schizoaffective disorder, bipolar type ([place]) [F25.0] 02/07/2017  . Alcohol use disorder, moderate, dependence ([place]) [F10.20] 02/07/2017  . Amphetamine use disorder, severe ([place]) [F15.20] 02/07/2017  . Psychosis ([place]) [F29] 02/07/2017  . Tinnitus of both ears [H93.13] 09/20/2016  . Raynaud's disease without gangrene [I73.00] 09/20/2016  . Polyarthralgia [M25.50] 09/20/2016  . Venereal disease contact [Z20.2] 09/20/2016  . Back pain [M54.9] 08/26/2016  . Bilateral hand swelling [M79.89] 08/26/2016  . Screen for STD (sexually transmitted disease) [Z11.3] 08/26/2016  . Fatigue [R53.83] 08/08/2016  . Dysphagia [R13.10] 08/03/2016  . Weight loss [R63.4] 08/03/2016  . Rash and nonspecific skin eruption [R21] 08/03/2016  . Constipation [K59.00] 07/01/2016  . Anxiety and depression [F41.9, F32.9] 07/01/2016  . Human immunodeficiency virus (HIV) disease ([place]) [B20] 02/26/2015  . Screening examination for venereal disease [Z11.3] 02/26/2015  . Encounter  for long-term (current) use of medications [Z79.899] 02/26/2015  . Celiac disease [K90.0] 02/26/2015  . Seizure disorder [facility]) [G40.909] 02/26/2015   Total Time spent with patient: 20 minutes  Past Psychiatric History: bipolar disorde, ADHD, substance abuse  Past Medical History:  Past Medical History:  Diagnosis Date  . Allergy   . Anxiety   . Celiac disease   . History of substance abuse   . HIV infection ([place])   . Raynaud disease   . Seizures ([place])     Past Surgical History:  Procedure Laterality Date  . WISDOM TOOTH EXTRACTION     Family History:  Family History  Problem Relation Age of Onset  . Heart disease Father 16  . Hypertension Father   . Heart disease Maternal Grandfather        early 21s  . Hypertension Maternal Grandfather   . Hypertension Mother   . Rheum arthritis Mother   . Crohn's disease Mother   . Crohn's disease Sister   . Rheum arthritis Maternal Grandmother   . Breast cancer Maternal Grandmother   . Colon cancer Paternal Grandmother   . Hypertension Paternal Grandfather   . Esophageal cancer Neg Hx   . Stomach cancer Neg Hx    Family Psychiatric  History: bipolar. Social History:  History  Alcohol Use  . 1.2 oz/week  . 2 Glasses of wine per week    Comment: occ     History  Drug Use No    Comment: Hx of maijuana, ecstacy, GHB, ketamine, amephetamines, cocaine - nothing since 2009    Social History   Social History  . Marital status: Unknown    Spouse name: N/A  . Number of children: N/A  . Years of education: N/A   Social History Main Topics  .  Smoking status: Never Smoker  . Smokeless tobacco: Never Used  . Alcohol use 1.2 oz/week    2 Glasses of wine per week     Comment: occ  . Drug use: No     Comment: Hx of maijuana, ecstacy, GHB, ketamine, amephetamines, cocaine - nothing since 2009  . Sexual activity: Yes    Partners: Male     Comment: Uses Condoms   Other Topics Concern  . None   Social History Narrative    Works in Engineer, technical sales.   Additional Social History:                         Sleep: Poor  Appetite:  Good  Current Medications: Current Facility-Administered Medications  Medication Dose Route Frequency Provider Last Rate Last Dose  . abacavir-dolutegravir-lamiVUDine (TRIUMEQ) [phone] MG per tablet 1 tablet  1 tablet Oral Daily Ethelene Hal, NP   1 tablet at 02/10/17 [phone]  . acetaminophen (TYLENOL) tablet 650 mg  650 mg Oral Q6H PRN Beatric Fulop B, MD      . alum & mag hydroxide-simeth (MAALOX/MYLANTA) 200-200-20 MG/5ML suspension 30 mL  30 mL Oral Q4H PRN Janis Sol B, MD      . fluPHENAZine (PROLIXIN) tablet 5 mg  5 mg Oral QHS Shanaya Schneck B, MD   5 mg at 02/09/17 2145  . guanFACINE (TENEX) tablet 1 mg  1 mg Oral Daily Ethelene Hal, NP   1 mg at 02/10/17 2836  . hydrOXYzine (ATARAX/VISTARIL) tablet 25 mg  25 mg Oral TID Ethelene Hal, NP   25 mg at 02/10/17 0814  . Lurasidone HCl TABS 120 mg  120 mg Oral QHS Christella App B, MD      . magnesium hydroxide (MILK OF MAGNESIA) suspension 30 mL  30 mL Oral Daily PRN Romolo Sieling B, MD      . traZODone (DESYREL) tablet 50 mg  50 mg Oral QHS Ethelene Hal, NP   50 mg at 02/09/17 2145    Lab Results: No results found for this or any previous visit (from the past 48 hour(s)).  Blood Alcohol level:  Lab Results  Component Value Date   ETH <10 62/94/7654    Metabolic Disorder Labs: Lab Results  Component Value Date   HGBA1C 5.0 02/08/2017   MPG 96.8 02/08/2017   No results found for: PROLACTIN Lab Results  Component Value Date   CHOL 184 02/08/2017   TRIG 223 (H) 02/08/2017   HDL 43 02/08/2017   CHOLHDL 4.3 02/08/2017   VLDL 45 (H) 02/08/2017   LDLCALC 96 02/08/2017   LDLCALC 96 11/30/2015    Physical Findings: AIMS: Facial and Oral Movements Muscles of Facial Expression: None, normal Lips and Perioral Area: None, normal Jaw: None, normal Tongue:  None, normal,Extremity Movements Upper (arms, wrists, hands, fingers): None, normal Lower (legs, knees, ankles, toes): None, normal, Trunk Movements Neck, shoulders, hips: None, normal, Overall Severity Severity of abnormal movements (highest score from questions above): None, normal Incapacitation due to abnormal movements: None, normal Patient's awareness of abnormal movements (rate only patient's report): No Awareness, Dental Status Current problems with teeth and/or dentures?: No Does patient usually wear dentures?: No  CIWA:  CIWA-Ar Total: 0 COWS:  COWS Total Score: 0  Musculoskeletal: Strength & Muscle Tone: within normal limits Gait & Station: normal Patient leans: N/A  Psychiatric Specialty Exam: Physical Exam  Nursing note and vitals reviewed. Psychiatric: His speech is normal. Thought content  normal. His affect is blunt. He is withdrawn. Cognition and memory are normal. He expresses impulsivity.    Review of Systems  Neurological: Negative.   Psychiatric/Behavioral: Positive for hallucinations and substance abuse.  All other systems reviewed and are negative.   Blood pressure 110/76, pulse 62, temperature 97.7 F (36.5 C), temperature source Oral, resp. rate 18, height 6' (1.829 m), weight 69.9 kg (154 lb), SpO2 97 %.Body mass index is 20.89 kg/m.  General Appearance: Casual  Eye Contact:  Good  Speech:  Clear and Coherent  Volume:  Decreased  Mood:  Anxious  Affect:  Appropriate  Thought Process:  Goal Directed and Descriptions of Associations: Intact  Orientation:  Full (Time, Place, and Person)  Thought Content:  WDL  Suicidal Thoughts:  No  Homicidal Thoughts:  No  Memory:  Immediate;   Fair Recent;   Fair Remote;   Fair  Judgement:  Poor  Insight:  Shallow  Psychomotor Activity:  Decreased  Concentration:  Concentration: Fair and Attention Span: Fair  Recall:  [employer] of Knowledge:  Fair  Language:  Fair  Akathisia:  No  Handed:  Right  AIMS (if  indicated):     Assets:  Communication Skills Desire for Improvement Financial Resources/Insurance Housing Resilience Social Support Transportation Vocational/Educational  ADL's:  Intact  Cognition:  WNL  Sleep:  Number of Hours: 7     Treatment Plan Summary: Daily contact with patient to assess and evaluate symptoms and progress in treatment and Medication management   Mr. Petersen is a 41 year old male with history of bipolar disorder with persistent auditory hallucinations admitted for worsening of his symptoms in the context of substance use.  #Mood and psychosis -continue Latuda 120 mg nightly -last night received Prolixin 5 mg and tolerated it well  #PTSD -Consider Minipress but BP low   #ADHD -continue Tenex 1 mg daily  #Insomnia -Trazodone 50 mg nightly  # HIV continue Triumeq  #Metabolic syndrome monitoring -Lipid panel, TSH and HgbA1C are normal -EKG, QTc 448  #Substance abuse -positive for amphetamines -drinks alcohol excessively -no symptoms of alcohol withdrawal -patient not interested in SA IOP participation  #Disposition -Discharge to home -Follow up with Dr. Assunta Gambles, MD 02/10/2017, 12:09 PM

## 2017-02-10 NOTE — Progress Notes (Signed)
Recreation Therapy Notes  Date: 11.02.18  Time: 3:00pm  Location: Craft room  Behavioral response: Appropriate  Group Type: Art/Craft  Participation level: Active  Communication: Patient was social with peers and staff.  Comments: N/A  Mande Auvil LRT/CTRS        John Robbins 02/10/2017 4:15 PM

## 2017-02-10 NOTE — Progress Notes (Signed)
Patient very calm and cooperative today; denies hearing voices today, denies SI or HI. Patient states he is feeling much better just sleepy. Patient spent most of the day in the in his room.

## 2017-02-10 NOTE — Telephone Encounter (Signed)
Informed mom of the Intensive outpatient program and she understood what she needed to do.

## 2017-02-10 NOTE — Plan of Care (Signed)
Problem: Activity: Goal: Interest or engagement in activities will improve Outcome: Progressing Patient is participating actively in activities and assertive with peers  Problem: Education: Goal: Knowledge of the prescribed therapeutic regimen will improve Outcome: Progressing Patient is aware of his medication and treatment modalities noted

## 2017-02-10 NOTE — Progress Notes (Signed)
Recreation Therapy Notes   Date: 11.02.18  Time: 9:30 am  Location: Craft Room  Behavioral response: N/A  Intervention Topic: Stress  Discussion/Intervention: Patient did not attend group.  Clinical Observations/Feedback:  Patient did not attend group.  Esai Stecklein LRT/CTRS         Pietro Bonura 02/10/2017 11:52 AM

## 2017-02-11 MED ORDER — FLUPHENAZINE HCL 5 MG PO TABS: 5.0000 mg | ORAL_TABLET | Freq: Every evening | ORAL | 0 refills | Status: DC | PRN

## 2017-02-11 MED ORDER — LURASIDONE HCL 80 MG PO TABS: 80.0000 mg | ORAL_TABLET | Freq: Every day | ORAL | 0 refills | Status: DC

## 2017-02-11 MED ORDER — LURASIDONE HCL 80 MG PO TABS: 80.0000 mg | ORAL_TABLET | Freq: Every day | ORAL | Status: DC

## 2017-02-11 NOTE — Discharge Summary (Signed)
Physician Discharge Summary Note  Patient:  John Robbins is an 41 y.o., male MRN:  [id number] DOB:  January 27, 1976 Patient phone:  [phone] (home)  Patient address:   [address] [place] [address],  Total Time spent with patient: 30 minutes  Date of Admission:  02/07/2017 Date of Discharge: 02/11/2017  Reason for Admission:  Psychotic break.  Identifying data.  John Robbins is a 41 year old homosexual male with a history of psychosis.  Chief complaint.  "I have voices."  History of present.  Information was obtained from the patient and the chart.  The patient came to the ER complaining of worsening of auditory hallucinations and increased paranoia.  His voices are commanding him to hurt himself and are very discouraging depending him that he will never feel better and will never be hallucinations free.  The patient reports some symptoms of depression and anxiety but he believes that they are all related to hallucination.  He started hallucinating at the beginning of the year.  In May he was prescribed Abilify and given Abilify injection.  The voices were better but he developed akathisia.  He was switched to Latuda 20 mg daily but that was recently increased to 40 mg.  He does not particularly useful but reports no side effects.  He is current stressors involve promotion at work, starting graduate school, and recent breakup. He denies symptoms suggestive of bipolar mania.  He reports nightmares and flashbacks of PTSD stemming from bad relationship.  He frequently feels anxious because of the voices. He denies OCD symptoms.  He has been drinking "no more than usual".  He does not feel that he has drinking problem.  There were no DTs or alcohol withdrawal seizures.  He uses amphetamines once a week to address hallucinations.  Past psychiatric history.  He was diagnosed with depression and anxiety while in high school and was prescribed Zoloft, Wellbutrin and Melleril.  In  May he was hospitalized in [place], diagnosed with bipolar and treated with Abilify. Brain MRI was recently performed to rule out brain involvement in the HIV process.  It was negative.  Family psychiatric history.  Mother with bipolar.  Social history.  He lives independently and works as a Financial planner.  Principal Problem: Schizoaffective disorder, bipolar type [facility]) Discharge Diagnoses: Patient Active Problem List   Diagnosis Date Noted  . Attention deficit hyperactivity disorder (ADHD) [F90.9] 02/08/2017  . PTSD (post-traumatic stress disorder) [F43.10] 02/08/2017  . Auditory hallucination [R44.0] 02/07/2017  . Schizoaffective disorder, bipolar type ([place]) [F25.0] 02/07/2017  . Alcohol use disorder, moderate, dependence ([place]) [F10.20] 02/07/2017  . Amphetamine use disorder, severe ([place]) [F15.20] 02/07/2017  . Psychosis ([place]) [F29] 02/07/2017  . Tinnitus of both ears [H93.13] 09/20/2016  . Raynaud's disease without gangrene [I73.00] 09/20/2016  . Polyarthralgia [M25.50] 09/20/2016  . Venereal disease contact [Z20.2] 09/20/2016  . Back pain [M54.9] 08/26/2016  . Bilateral hand swelling [M79.89] 08/26/2016  . Screen for STD (sexually transmitted disease) [Z11.3] 08/26/2016  . Fatigue [R53.83] 08/08/2016  . Dysphagia [R13.10] 08/03/2016  . Weight loss [R63.4] 08/03/2016  . Rash and nonspecific skin eruption [R21] 08/03/2016  . Constipation [K59.00] 07/01/2016  . Anxiety and depression [F41.9, F32.9] 07/01/2016  . Human immunodeficiency virus (HIV) disease ([place]) [B20] 02/26/2015  . Screening examination for venereal disease [Z11.3] 02/26/2015  . Encounter for long-term (current) use of medications [Z79.899] 02/26/2015  . Celiac disease [K90.0] 02/26/2015  . Seizure disorder [facility]) [Q25.956] 02/26/2015    Past Medical History:  Past Medical History:  Diagnosis Date  . Allergy   . Anxiety   . Celiac disease   . History of substance abuse   . HIV infection ([place])   . Raynaud  disease   . Seizures ([place])     Past Surgical History:  Procedure Laterality Date  . WISDOM TOOTH EXTRACTION     Family History:  Family History  Problem Relation Age of Onset  . Heart disease Father 51  . Hypertension Father   . Heart disease Maternal Grandfather        early 33s  . Hypertension Maternal Grandfather   . Hypertension Mother   . Rheum arthritis Mother   . Crohn's disease Mother   . Crohn's disease Sister   . Rheum arthritis Maternal Grandmother   . Breast cancer Maternal Grandmother   . Colon cancer Paternal Grandmother   . Hypertension Paternal Grandfather   . Esophageal cancer Neg Hx   . Stomach cancer Neg Hx    Social History:  History  Alcohol Use  . 1.2 oz/week  . 2 Glasses of wine per week    Comment: occ     History  Drug Use No    Comment: Hx of maijuana, ecstacy, GHB, ketamine, amephetamines, cocaine - nothing since 2009    Social History   Social History  . Marital status: Unknown    Spouse name: N/A  . Number of children: N/A  . Years of education: N/A   Social History Main Topics  . Smoking status: Never Smoker  . Smokeless tobacco: Never Used  . Alcohol use 1.2 oz/week    2 Glasses of wine per week     Comment: occ  . Drug use: No     Comment: Hx of maijuana, ecstacy, GHB, ketamine, amephetamines, cocaine - nothing since 2009  . Sexual activity: Yes    Partners: Male     Comment: Uses Condoms   Other Topics Concern  . None   Social History Narrative   Works in Engineer, technical sales.    Hospital Course:    John Robbins is a 41 year old homosexual male with a history of bipolar disorder with persistent auditory hallucinations admitted for worsening of his symptoms in the context of substance use.  #Mood and psychosis -Continue Latuda 80 mg nightly  -Prolixin 5 mg is available as needed for break through voices  #PTSD -Considered Minipress but BP is low   #ADHD -continue Tenex 1 mg daily  #Insomnia -Trazodone 50 mg nightly  #  HIV continue Triumeq  #Metabolic syndrome monitoring -Lipid panel, TSH and HgbA1C are normal -EKG, QTc 448  #Substance abuse -positive for amphetamines -drinks alcohol excessively -no symptoms of alcohol withdrawal -patient agrees to SA IOP participation   #Disposition -Discharge to home -Follow up with Dr. Daron Offer for medication management and SA IOP for substance abuse  Physical Findings: AIMS: Facial and Oral Movements Muscles of Facial Expression: None, normal Lips and Perioral Area: None, normal Jaw: None, normal Tongue: None, normal,Extremity Movements Upper (arms, wrists, hands, fingers): None, normal Lower (legs, knees, ankles, toes): None, normal, Trunk Movements Neck, shoulders, hips: None, normal, Overall Severity Severity of abnormal movements (highest score from questions above): None, normal Incapacitation due to abnormal movements: None, normal Patient's awareness of abnormal movements (rate only patient's report): No Awareness, Dental Status Current problems with teeth and/or dentures?: No Does patient usually wear dentures?: No  CIWA:  CIWA-Ar Total: 0 COWS:  COWS Total Score: 0  Musculoskeletal: Strength & Muscle Tone:  within normal limits Gait & Station: normal Patient leans: N/A  Psychiatric Specialty Exam: Physical Exam  Nursing note and vitals reviewed. Psychiatric: He has a normal mood and affect. His speech is normal and behavior is normal. Thought content normal. Cognition and memory are normal. He expresses impulsivity.    Review of Systems  Neurological: Negative.   Psychiatric/Behavioral: Negative.   All other systems reviewed and are negative.   Blood pressure 114/78, pulse (!) 57, temperature 98 F (36.7 C), temperature source Oral, resp. rate 18, height 6' (1.829 m), weight 69.9 kg (154 lb), SpO2 97 %.Body mass index is 20.89 kg/m.  General Appearance: Casual  Eye Contact:  Good  Speech:  Clear and Coherent  Volume:  Normal   Mood:  Euthymic  Affect:  Appropriate  Thought Process:  Goal Directed and Descriptions of Associations: Intact  Orientation:  Full (Time, Place, and Person)  Thought Content:  WDL  Suicidal Thoughts:  No  Homicidal Thoughts:  No  Memory:  Immediate;   Fair Recent;   Fair Remote;   Fair  Judgement:  Poor  Insight:  Shallow  Psychomotor Activity:  Normal  Concentration:  Concentration: Fair and Attention Span: Fair  Recall:  [employer] of Knowledge:  Fair  Language:  Fair  Akathisia:  No  Handed:  Right  AIMS (if indicated):     Assets:  Communication Skills Desire for Improvement Financial Resources/Insurance Housing Resilience Social Support Transportation Vocational/Educational  ADL's:  Intact  Cognition:  WNL  Sleep:  Number of Hours: 8.15     Have you used any form of tobacco in the last 30 days? (Cigarettes, Smokeless Tobacco, Cigars, and/or Pipes): No  Has this patient used any form of tobacco in the last 30 days? (Cigarettes, Smokeless Tobacco, Cigars, and/or Pipes) Yes, No  Blood Alcohol level:  Lab Results  Component Value Date   ETH <10 40/98/1191    Metabolic Disorder Labs:  Lab Results  Component Value Date   HGBA1C 5.0 02/08/2017   MPG 96.8 02/08/2017   No results found for: PROLACTIN Lab Results  Component Value Date   CHOL 184 02/08/2017   TRIG 223 (H) 02/08/2017   HDL 43 02/08/2017   CHOLHDL 4.3 02/08/2017   VLDL 45 (H) 02/08/2017   LDLCALC 96 02/08/2017   [place] 96 11/30/2015    See Psychiatric Specialty Exam and Suicide Risk Assessment completed by Attending Physician prior to discharge.  Discharge destination:  Home  Is patient on multiple antipsychotic therapies at discharge:  Yes,   Do you recommend tapering to monotherapy for antipsychotics?  No   Has Patient had three or more failed trials of antipsychotic monotherapy by history:  No  Recommended Plan for Multiple Antipsychotic Therapies: Taper to monotherapy as  described:  discontinue Prolixin  Discharge Instructions    Diet - low sodium heart healthy    Complete by:  As directed    Increase activity slowly    Complete by:  As directed      Allergies as of 02/11/2017      Reactions   Gluten Meal Other (See Comments)   Neurological      Medication List    STOP taking these medications   Vitamin D (Cholecalciferol) 1000 units Caps     TAKE these medications     Indication  fluPHENAZine 5 MG tablet Commonly known as:  PROLIXIN Take 1 tablet (5 mg total) by mouth at bedtime as needed (hallucinations).  Indication:  Psychosis  guanFACINE 1 MG tablet Commonly known as:  TENEX Take 1 tablet (1 mg total) by mouth 3 (three) times daily. What changed:  when to take this  additional instructions  Indication:  ADHD   lurasidone 80 MG Tabs tablet Commonly known as:  LATUDA Take 1 tablet (80 mg total) by mouth at bedtime. What changed:  medication strength  how much to take  when to take this  Indication:  psychosis   traZODone 50 MG tablet Commonly known as:  DESYREL Take 1 tablet (50 mg total) by mouth at bedtime.  Indication:  Trouble Sleeping   TRIUMEQ 600-50-300 MG tablet Generic drug:  abacavir-dolutegravir-lamiVUDine TAKE 1 TABLET BY MOUTH ONCE EVERY DAY  Indication:  HIV Disease      Follow-up Information    BEHAVIORAL HEALTH OUTPATIENT THERAPY [place] Follow up on 02/22/2017.   Specialty:  Behavioral Health Why:  Hospital discharge follow up w Dr Daron Offer on 11/14 at 9:30 AM.  Please call to cancel/reschedule if needed.  Contact information: [place] [id number] [place], [facility] Follow up on 02/13/2017.   Specialty:  Behavioral Health Why:  Next appt w therapist is 11/5 at 2 PM w [facility] LCSW.  Please call to cancel/reschedule if needed.   Contact information: [place]  [address] [phone]           Follow-up recommendations:  Activity:  as tolerated Diet:  regular Other:  keep follow up appointments  Comments:    Signed: Orson Slick, MD 02/11/2017, 10:11 AM

## 2017-02-11 NOTE — Progress Notes (Signed)
  [facility] Adult Case Management Discharge Plan :  Will you be returning to the same living situation after discharge:  Yes,  home At discharge, do you have transportation home?: Yes,  mother Do you have the ability to pay for your medications: Yes,  insurance  Release of information consent forms completed and in the chart;  Patient's signature needed at discharge.  Patient to Follow up at: Follow-up Information    BEHAVIORAL HEALTH OUTPATIENT THERAPY [place] Follow up on 02/22/2017.   Specialty:  Behavioral Health Why:  Hospital discharge follow up w Dr Daron Offer on 11/14 at 9:30 AM.  Please call to cancel/reschedule if needed.  Contact information: [place] [id number] [place], [facility] Follow up on 02/13/2017.   Specialty:  Behavioral Health Why:  Next appt w therapist is 11/5 at 2 PM w [facility] LCSW.  Please call to cancel/reschedule if needed.   Contact information: [address] [place] [address] [phone]           Next level of care provider has access to [facility] and Suicide Prevention discussed: Yes,  mother and pt  Have you used any form of tobacco in the last 30 days? (Cigarettes, Smokeless Tobacco, Cigars, and/or Pipes): No  Has patient been referred to the Quitline?: Patient refused referral  Patient has been referred for addiction treatment: Yes  [place], LCSW 02/11/2017, 10:08 AM

## 2017-02-11 NOTE — Progress Notes (Signed)
Patient was provided with discharge summary, Transition packet, and Suicide Risk Assessment. Verbalized discharge summary, transition packet and suicide risk assessment, patient made aware of any change in medications, and all upcoming appointments.   Patient belongings returned. Personal Medications returned, Rx provided to patient.  Patient denied any SI, denies plans for self harm or the harm of others. Patient is calm, with appropriate affect and eye contact. Patient reports no complaints at this time.   Patient will be discharged to family member.

## 2017-02-11 NOTE — [facility] Group Notes (Signed)
[facility] Group Notes:  (Nursing/MHT/Case Management/Adjunct)  Date:  02/11/2017  Time:  4:58 AM  Type of Therapy:  Psychoeducational Skills  Participation Level:  Did Not Attend   Summary of Progress/Problems:  John Robbins 02/11/2017, 4:58 AM

## 2017-02-11 NOTE — Plan of Care (Signed)
Problem: Activity: Goal: Sleeping patterns will improve Outcome: Progressing Patient slept 8 hours and 15 minutes

## 2017-02-11 NOTE — [facility] Suicide Risk Assessment (Signed)
[facility] Discharge Suicide Risk Assessment   Principal Problem: Schizoaffective disorder, bipolar type [facility]) Discharge Diagnoses:  Patient Active Problem List   Diagnosis Date Noted  . Attention deficit hyperactivity disorder (ADHD) [F90.9] 02/08/2017  . PTSD (post-traumatic stress disorder) [F43.10] 02/08/2017  . Auditory hallucination [R44.0] 02/07/2017  . Schizoaffective disorder, bipolar type ([place]) [F25.0] 02/07/2017  . Alcohol use disorder, moderate, dependence ([place]) [F10.20] 02/07/2017  . Amphetamine use disorder, severe ([place]) [F15.20] 02/07/2017  . Psychosis ([place]) [F29] 02/07/2017  . Tinnitus of both ears [H93.13] 09/20/2016  . Raynaud's disease without gangrene [I73.00] 09/20/2016  . Polyarthralgia [M25.50] 09/20/2016  . Venereal disease contact [Z20.2] 09/20/2016  . Back pain [M54.9] 08/26/2016  . Bilateral hand swelling [M79.89] 08/26/2016  . Screen for STD (sexually transmitted disease) [Z11.3] 08/26/2016  . Fatigue [R53.83] 08/08/2016  . Dysphagia [R13.10] 08/03/2016  . Weight loss [R63.4] 08/03/2016  . Rash and nonspecific skin eruption [R21] 08/03/2016  . Constipation [K59.00] 07/01/2016  . Anxiety and depression [F41.9, F32.9] 07/01/2016  . Human immunodeficiency virus (HIV) disease ([place]) [B20] 02/26/2015  . Screening examination for venereal disease [Z11.3] 02/26/2015  . Encounter for long-term (current) use of medications [Z79.899] 02/26/2015  . Celiac disease [K90.0] 02/26/2015  . Seizure disorder ([place]) [G40.909] 02/26/2015    Total Time spent with patient: 30 minutes  Musculoskeletal: Strength & Muscle Tone: within normal limits Gait & Station: normal Patient leans: N/A  Psychiatric Specialty Exam: Review of Systems  Neurological: Negative.   Psychiatric/Behavioral: Negative.   All other systems reviewed and are negative.   Blood pressure 114/78, pulse (!) 57, temperature 98 F (36.7 C), temperature source Oral, resp. rate 18, height 6' (1.829 m), weight  69.9 kg (154 lb), SpO2 97 %.Body mass index is 20.89 kg/m.  General Appearance: Casual  Eye Contact::  Good  Speech:  Clear and Coherent409  Volume:  Normal  Mood:  Euthymic  Affect:  Appropriate  Thought Process:  Goal Directed and Descriptions of Associations: Intact  Orientation:  Full (Time, Place, and Person)  Thought Content:  WDL  Suicidal Thoughts:  No  Homicidal Thoughts:  No  Memory:  Immediate;   Fair Recent;   Fair Remote;   Fair  Judgement:  Poor  Insight:  Shallow  Psychomotor Activity:  Normal  Concentration:  Fair  Recall:  [employer] of Knowledge:Fair  Language: Fair  Akathisia:  No  Handed:  Right  AIMS (if indicated):     Assets:  Communication Skills Desire for Improvement Financial Resources/Insurance Housing Physical Health Resilience Social Support Transportation Vocational/Educational  Sleep:  Number of Hours: 8.15  Cognition: WNL  ADL's:  Intact   Mental Status Per Nursing Assessment::   On Admission:     Demographic Factors:  Male, Caucasian, Gay, lesbian, or bisexual orientation and Living alone  Loss Factors: NA  Historical Factors: Impulsivity  Risk Reduction Factors:   Sense of responsibility to family, Employed, Positive social support and Positive therapeutic relationship  Continued Clinical Symptoms:  Bipolar Disorder:   Mixed State  Cognitive Features That Contribute To Risk:  None    Suicide Risk:  Minimal: No identifiable suicidal ideation.  Patients presenting with no risk factors but with morbid ruminations; may be classified as minimal risk based on the severity of the depressive symptoms  Follow-up Information    [facility] Follow up on 02/22/2017.   Specialty:  Behavioral Health Why:  Hospital discharge follow up w Dr Daron Offer on 11/14 at 9:30 AM.  Please  call to cancel/reschedule if needed.  Contact information: [place] [phone] [place], [facility] Follow up on 02/13/2017.   Specialty:  Behavioral Health Why:  Next appt w therapist is 11/5 at 2 PM w [facility] LCSW.  Please call to cancel/reschedule if needed.   Contact information: [place] [address] [phone]           Plan Of Care/Follow-up recommendations:  Activity:  as tolerated Diet:  regular Other:  keep follow up appointments  Orson Slick, MD 02/11/2017, 10:07 AM

## 2017-02-11 NOTE — Progress Notes (Signed)
Data: Patient is alert and oriented, reports no AVH, and has denied SI/HI at this time. Patient is depressed in mood with appropriate affect. Patient has no physical and a pain rating of 0/10. Patient reports "poor" sleep for 8 hours and 15 mintues, appetite is "good". Patient rates depression "0/10" , Feelings of hopelessness "2/10" and Anxiety "0/10" Patients goal for today is to "stay awake."  Patient is isolated to room in the bed, patient is scheduled for discharge today.   Action:  Q x 15 minute observation checks were completed for safety. Patient was provided with education on medications. Patient was offered support and encouragement. Patient was given scheduled medications. Patient  was encourage to attend groups, participate in unit activities and continue with plan of care.    Response: Patient is medication compliant. Patient has no complaints at this time. Patient is receptive to treatment and safety maintained on unit.

## 2017-02-14 ENCOUNTER — Ambulatory Visit (INDEPENDENT_AMBULATORY_CARE_PROVIDER_SITE_OTHER): Payer: 59 | Admitting: Psychology

## 2017-02-14 DIAGNOSIS — F1520 Other stimulant dependence, uncomplicated: Secondary | ICD-10-CM | POA: Diagnosis not present

## 2017-02-16 ENCOUNTER — Other Ambulatory Visit (HOSPITAL_COMMUNITY): Payer: 59 | Attending: Psychiatry | Admitting: Psychology

## 2017-02-16 DIAGNOSIS — F250 Schizoaffective disorder, bipolar type: Secondary | ICD-10-CM | POA: Insufficient documentation

## 2017-02-16 DIAGNOSIS — I7300 Raynaud's syndrome without gangrene: Secondary | ICD-10-CM | POA: Insufficient documentation

## 2017-02-16 DIAGNOSIS — G2571 Drug induced akathisia: Secondary | ICD-10-CM | POA: Insufficient documentation

## 2017-02-16 DIAGNOSIS — F4312 Post-traumatic stress disorder, chronic: Secondary | ICD-10-CM | POA: Diagnosis not present

## 2017-02-16 DIAGNOSIS — F411 Generalized anxiety disorder: Secondary | ICD-10-CM | POA: Diagnosis not present

## 2017-02-16 DIAGNOSIS — K900 Celiac disease: Secondary | ICD-10-CM | POA: Diagnosis not present

## 2017-02-16 DIAGNOSIS — F1520 Other stimulant dependence, uncomplicated: Secondary | ICD-10-CM

## 2017-02-16 DIAGNOSIS — G4700 Insomnia, unspecified: Secondary | ICD-10-CM | POA: Insufficient documentation

## 2017-02-16 DIAGNOSIS — H9012 Conductive hearing loss, unilateral, left ear, with unrestricted hearing on the contralateral side: Secondary | ICD-10-CM | POA: Diagnosis not present

## 2017-02-16 DIAGNOSIS — B20 Human immunodeficiency virus [HIV] disease: Secondary | ICD-10-CM | POA: Diagnosis not present

## 2017-02-17 ENCOUNTER — Encounter (HOSPITAL_COMMUNITY): Payer: Self-pay | Admitting: Psychology
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
## 2017-02-17 NOTE — Progress Notes (Signed)
    Daily Group Progress Note  Program: CD-IOP   02/17/2017 Lenzy Kerschner [id number]  Diagnosis:  No diagnosis found.   Sobriety Date: 11/5  Group Time: 1-2:30pm  Participation Level: Minimal  Behavioral Response: Appropriate and Sharing  Type of Therapy: Process Group  Interventions: Supportive  Topic: Process: The first half of group was spent in process. Members shared about their experiences in early recovery, identifying speed bumps and shining moments. A new group member was present and he introduced himself during this half of group. One drug test was collected while previously collected test results returned.   Group Time: 2:30-4pm  Participation Level: Minimal  Behavioral Response: Appropriate  Type of Therapy: Psycho-education Group  Interventions: Strength-based  Topic: Psycho-Ed: From unhealthy thoughts to relapse prevention. The second half of group was spent in psycho-ed. members began to share the topic on their popsicle stick. The first word or phrase to be addressed in recovery was 'unhealthy thoughts'. The member seemed confused at first, but was eventually able to identify specific things he might to address these unhealthy thoughts. This generated a spontaneous review of the four steps that result in relapse: Trigger, thought, craving, use. This four-step process was discussed at length with all members asked to identify how they will address thoughts, change them and avoid moving into the craving or urge aspect? This generated a lively discussion and the session ended with members displaying a renewed intention around avoiding relapse.  Summary: The patient was new to group today. He introduced himself to his fellow group members and shared about his drug use. The patient admitted he had tried to manage his addiction by himself, but has accepted that, "I cannot do it myself". He reported he has difficulty in groups and is feeling a little  uncomfortable, but received support and a warm welcome from the group. He was very reserved for most of the session, but provided a little feedback during the psycho-ed about relapse. A drug test was collected from this patient. He will meet with the program director on Monday. He was very quiet, but responded well to this first intervention and assured the group he would be back on Monday.   UDS collected: Yes Results: pending  AA/NA attended?: YesWednesday  Sponsor?: No   Brandon Melnick, [place] 02/17/2017 10:09 AM

## 2017-02-20 ENCOUNTER — Encounter (HOSPITAL_COMMUNITY): Payer: Self-pay | Admitting: Medical

## 2017-02-20 ENCOUNTER — Other Ambulatory Visit (INDEPENDENT_AMBULATORY_CARE_PROVIDER_SITE_OTHER): Payer: 59 | Admitting: Psychology

## 2017-02-20 VITALS — BP 124/72 | HR 60 | Ht 73.0 in | Wt 157.8 lb

## 2017-02-20 DIAGNOSIS — F5104 Psychophysiologic insomnia: Secondary | ICD-10-CM | POA: Diagnosis not present

## 2017-02-20 DIAGNOSIS — F4312 Post-traumatic stress disorder, chronic: Secondary | ICD-10-CM

## 2017-02-20 DIAGNOSIS — I7300 Raynaud's syndrome without gangrene: Secondary | ICD-10-CM

## 2017-02-20 DIAGNOSIS — B20 Human immunodeficiency virus [HIV] disease: Secondary | ICD-10-CM | POA: Diagnosis not present

## 2017-02-20 DIAGNOSIS — F1520 Other stimulant dependence, uncomplicated: Secondary | ICD-10-CM | POA: Diagnosis not present

## 2017-02-20 DIAGNOSIS — H9012 Conductive hearing loss, unilateral, left ear, with unrestricted hearing on the contralateral side: Secondary | ICD-10-CM | POA: Diagnosis not present

## 2017-02-20 DIAGNOSIS — K900 Celiac disease: Secondary | ICD-10-CM | POA: Diagnosis not present

## 2017-02-20 DIAGNOSIS — F29 Unspecified psychosis not due to a substance or known physiological condition: Secondary | ICD-10-CM | POA: Diagnosis not present

## 2017-02-20 DIAGNOSIS — R440 Auditory hallucinations: Secondary | ICD-10-CM | POA: Diagnosis not present

## 2017-02-20 DIAGNOSIS — G2571 Drug induced akathisia: Secondary | ICD-10-CM | POA: Diagnosis not present
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
## 2017-02-21 ENCOUNTER — Encounter (HOSPITAL_COMMUNITY): Payer: Self-pay | Admitting: Licensed Clinical Social Worker

## 2017-02-21 DIAGNOSIS — F1520 Other stimulant dependence, uncomplicated: Secondary | ICD-10-CM

## 2017-02-21 DIAGNOSIS — F4312 Post-traumatic stress disorder, chronic: Secondary | ICD-10-CM

## 2017-02-21 DIAGNOSIS — B20 Human immunodeficiency virus [HIV] disease: Secondary | ICD-10-CM

## 2017-02-22 ENCOUNTER — Other Ambulatory Visit (HOSPITAL_COMMUNITY): Payer: 59 | Admitting: Psychology

## 2017-02-22 ENCOUNTER — Encounter (HOSPITAL_COMMUNITY): Payer: Self-pay | Admitting: Medical

## 2017-02-22 ENCOUNTER — Encounter (HOSPITAL_COMMUNITY): Payer: Self-pay | Admitting: Licensed Clinical Social Worker

## 2017-02-22 ENCOUNTER — Encounter (HOSPITAL_COMMUNITY): Payer: Self-pay | Admitting: Psychiatry

## 2017-02-22 ENCOUNTER — Ambulatory Visit (INDEPENDENT_AMBULATORY_CARE_PROVIDER_SITE_OTHER): Payer: 59 | Admitting: Psychiatry

## 2017-02-22 VITALS — BP 124/80 | HR 53 | Ht 73.0 in | Wt 159.8 lb

## 2017-02-22 DIAGNOSIS — F29 Unspecified psychosis not due to a substance or known physiological condition: Secondary | ICD-10-CM | POA: Diagnosis not present

## 2017-02-22 DIAGNOSIS — F1520 Other stimulant dependence, uncomplicated: Secondary | ICD-10-CM | POA: Diagnosis not present

## 2017-02-22 DIAGNOSIS — F5104 Psychophysiologic insomnia: Secondary | ICD-10-CM

## 2017-02-22 DIAGNOSIS — B20 Human immunodeficiency virus [HIV] disease: Secondary | ICD-10-CM | POA: Diagnosis not present

## 2017-02-22 DIAGNOSIS — G2571 Drug induced akathisia: Secondary | ICD-10-CM | POA: Diagnosis not present

## 2017-02-22 DIAGNOSIS — F411 Generalized anxiety disorder: Secondary | ICD-10-CM

## 2017-02-22 MED ORDER — LURASIDONE HCL 80 MG PO TABS: 80.0000 mg | ORAL_TABLET | Freq: Every day | ORAL | 1 refills | Status: DC

## 2017-02-22 MED ORDER — TRAZODONE HCL 150 MG PO TABS: 150.0000 mg | ORAL_TABLET | Freq: Every day | ORAL | 1 refills | Status: DC
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
## 2017-02-22 NOTE — Progress Notes (Signed)
John Robbins is a 41 y.o. male patient.     Daily Group Progress Note  Program: CD-IOP   02/22/2017 Joycelyn Rua [id number]  Diagnosis:  Amphetamine and psychostimulant dependence, abuse (HCC)  Methamphetamine use disorder, severe (HCC)  Human immunodeficiency virus (HIV) disease ([place])  Chronic post-traumatic stress disorder (PTSD)  Verbal auditory hallucination  Psychosis, unspecified psychosis type ([place])  Drug induced akathisia - Abilify-subsequent encounter  Psychophysiological insomnia  Conductive hearing loss of left ear with unrestricted hearing of right ear  Raynaud's disease without gangrene  Celiac disease - seizure disorder resolved with gluten free diet   Sobriety Date: 11/6  Group Time: 1-2:30  Participation Level: Active  Behavioral Response: Appropriate and Sharing  Type of Therapy: Process Group  Interventions: CBT and Motivational Interviewing  Topic: Counselors led a patient centered process session designed to elicit pt's change talk, discuss their active recovery from mind-altering drugs and alcohol, and help motivate pts to sustain changes. Pts were encouraged to continue meeting their goals of attending AA/NA and using CBT based interventions to prevent relapse and tolerate distressing emotions.     Group Time: 2:30-4  Participation Level: Active  Behavioral Response: Appropriate and Sharing  Type of Therapy: Psycho-education Group  Interventions: CBT  Topic: Counselors led a psychoeducation session aimed at increasing pt's understanding, and insight into their personal values. Pts did a "values sort" ranking their top 10 personal values. Pts discussed the process of sorting and choosing their values w/ each other. Pts appeared engaged and interested.    Summary: Pt was active and engaged in session today. He was talkative and shared openly about his struggles w/ Meth addiction, control issues, and becoming  easily overly trusting of others. Pt stated he attended 2 AA/NA meetings over the weekend. Pt reported that he has trouble w/ being "too empathetic" towards others and it prevents him from knowing how to care for himself.  Pt identified his number 1 value as "respect", stating "I'm a live and let live kind of person and I want that from others". Pt reported it was challenging to identify "just 10 values" from the list.    UDS collected: Yes Results: negative  AA/NA attended?: YesFriday and Saturday  Sponsor?: No  [name], LPCA LCASA 02/22/2017 10:17 AM       Wes Luiz Blare, LPCA LCASA

## 2017-02-22 NOTE — Progress Notes (Signed)
[place] MD/PA/NP OP Progress Note  02/22/2017 10:04 AM John Robbins  MRN:  [id number]  Chief Complaint: Doing okay, voices are better HPI: I spent time with patient reviewing past 7 months of his auditory hallucinations, and as it turns out the patient was using crystal methamphetamines injected intravenously and/or smoked.  I spent time with the patient expressing my sadness for his symptoms, and encouraging him to please be honest in sharing with writer, as I suspect we would have been able to help him much more quickly if I knew about his substance use.  He was apologetic and acknowledges that he was deceitful.  We agreed to focus on his current symptoms and supporting him to prevent relapse.  He appears to be doing well in terms of mood and psychotic symptoms with Latuda 80 mg nightly.  He is sleeping fairly with trazodone 50-100 mg, so we agreed to increase to 150 mg nightly.  He denies any significant side effects from the [place] and agrees to follow-up in 2-3 months.  He continues to participate in the CD IOP program.  He is in a relationship with a male partner who is also HIV positive, and he has shared his HIV status with that individual.  He continues on his HIV medication regimen, and reports that he is now approximately 3 weeks sober from crystal meth.  Visit Diagnosis:    ICD-10-CM   1. Methamphetamine use disorder, severe (HCC) F15.20   2. Generalized anxiety disorder F41.1   3. Drug induced akathisia G25.71   4. Psychophysiological insomnia F51.04 traZODone (DESYREL) 150 MG tablet  5. Psychosis, unspecified psychosis type ([place]) F29 lurasidone (LATUDA) 80 MG TABS tablet    Past Psychiatric History: See intake H&P for full details. Reviewed, with no updates at this time.   Past Medical History:  Past Medical History:  Diagnosis Date  . Allergy   . Anxiety   . Celiac disease   . History of substance abuse   . HIV infection ([place])   . Raynaud disease   . Seizures ([place])     Past Surgical History:  Procedure Laterality Date  . WISDOM TOOTH EXTRACTION      Family Psychiatric History: See intake H&P for full details. Reviewed, with no updates at this time.   Family History:  Family History  Problem Relation Age of Onset  . Heart disease Father 80  . Hypertension Father   . Heart disease Maternal Grandfather        early 57s  . Hypertension Maternal Grandfather   . Hypertension Mother   . Rheum arthritis Mother   . Crohn's disease Mother   . Crohn's disease Sister   . Rheum arthritis Maternal Grandmother   . Breast cancer Maternal Grandmother   . Colon cancer Paternal Grandmother   . Hypertension Paternal Grandfather   . Esophageal cancer Neg Hx   . Stomach cancer Neg Hx     Social History:  Social History   Socioeconomic History  . Marital status: Unknown    Spouse name: None  . Number of children: None  . Years of education: None  . Highest education level: None  Social Needs  . Financial resource strain: None  . Food insecurity - worry: None  . Food insecurity - inability: None  . Transportation needs - medical: None  . Transportation needs - non-medical: None  Occupational History  . None  Tobacco Use  . Smoking status: Never Smoker  . Smokeless tobacco: Never Used  Substance and Sexual Activity  . Alcohol use: Yes    Alcohol/week: 1.2 oz    Types: 2 Glasses of wine per week    Comment: occ  . Drug use: No    Comment: Hx of maijuana, ecstacy, GHB, ketamine, amephetamines, cocaine - nothing since 2009  . Sexual activity: Yes    Partners: Male    Comment: Uses Condoms  Other Topics Concern  . None  Social History Narrative   Works in Engineer, technical sales.    Allergies:  Allergies  Allergen Reactions  . Abilify [Aripiprazole] Other (See Comments)    Akathesia  . Gluten Meal Other (See Comments)    Neurological    Metabolic Disorder Labs: Lab Results  Component Value Date   HGBA1C 5.0 02/08/2017   MPG 96.8 02/08/2017   No  results found for: PROLACTIN Lab Results  Component Value Date   CHOL 184 02/08/2017   TRIG 223 (H) 02/08/2017   HDL 43 02/08/2017   CHOLHDL 4.3 02/08/2017   VLDL 45 (H) 02/08/2017   LDLCALC 96 02/08/2017   LDLCALC 96 11/30/2015   Lab Results  Component Value Date   TSH 0.982 02/08/2017   TSH 3.330 08/05/2016    Therapeutic Level Labs: No results found for: LITHIUM No results found for: VALPROATE No components found for:  CBMZ  Current Medications: Current Outpatient Medications  Medication Sig Dispense Refill  . lurasidone (LATUDA) 80 MG TABS tablet Take 1 tablet (80 mg total) at bedtime by mouth. 90 tablet 1  . traZODone (DESYREL) 150 MG tablet Take 1 tablet (150 mg total) at bedtime by mouth. 90 tablet 1  . triamcinolone cream (KENALOG) 0.1 % APPLY TO AFFECTED AREA TWICE DAILY AS NEEDED  1  . TRIUMEQ 600-50-300 MG tablet TAKE 1 TABLET BY MOUTH ONCE EVERY DAY 90 tablet 2  . fluconazole (DIFLUCAN) 200 MG tablet      No current facility-administered medications for this visit.      Musculoskeletal: Strength & Muscle Tone: within normal limits Gait & Station: normal Patient leans: N/A  Psychiatric Specialty Exam: ROS  Blood pressure 124/80, pulse (!) 53, height 6' 1"  (1.854 m), weight 159 lb 12.8 oz (72.5 kg).Body mass index is 21.08 kg/m.  General Appearance: Casual, Meticulous and Well Groomed  Eye Contact:  Good  Speech:  Clear and Coherent  Volume:  Normal  Mood:  Euthymic  Affect:  Congruent  Thought Process:  Goal Directed and Descriptions of Associations: Intact  Orientation:  Full (Time, Place, and Person)  Thought Content: Logical   Suicidal Thoughts:  No  Homicidal Thoughts:  No  Memory:  Immediate;   Fair  Judgement:  Fair  Insight:  Shallow  Psychomotor Activity:  Normal  Concentration:  Concentration: Good  Recall:  Good  Fund of Knowledge: Good  Language: Good  Akathisia:  Negative  Handed:  Right  AIMS (if indicated): not done  Assets:   Communication Skills Desire for Improvement Financial Resources/Insurance Housing  ADL's:  Intact  Cognition: WNL  Sleep:  Good   Screenings: AIMS     Admission (Discharged) from 02/07/2017 in [facility] Total Score  0    AUDIT     Counselor from 02/14/2017 in [facility] Ad[facility] (Discharged) from 02/07/2017 in [place]  Alcohol Use Disorder Identification Test Final Score (AUDIT)  2  3    GAD-7     Counselor from 02/14/2017 in [facility]  Total GAD-7 Score  15    PHQ2-9     Counselor from 02/14/2017 in [place] Office Visit from 12/08/2016 in [facility] Office Visit from 09/20/2016 in [place] Visit from 08/26/2016 in [place] Office Visit from 08/05/2016 in [place]  PHQ-2 Total Score  2  0  0  2  0  PHQ-9 Total Score  9  No data  No data  12  No data       Assessment and Plan:  John Robbins is participating in the CD IOP program.  Given the recent turn of events, it is come to light that the patient was using injectable crystal meth for the past 6-7 months.  I suspect his overall psychiatric presentation is consistent with substance-induced psychosis.  I have a low suspicion of primary thought disorder.  He appears to have good insight into his previous hallucinations and delusions.  He is now 3 weeks sober from methamphetamine use.  He has good support from his mother, and a recent partner that he has been dating for about 2 months.  We will continue as below and follow-up in 3-4 months or sooner if needed.  We will support him to be out of work while he is participating in the CD IOP, and gradually return to work part-time after CD IOP has been completed.  1. Methamphetamine use disorder, severe  ([place])   2. Generalized anxiety disorder   3. Drug induced akathisia   4. Psychophysiological insomnia   5. Psychosis, unspecified psychosis type ([place])     Status of current problems: gradually improving  Labs Ordered: No orders of the defined types were placed in this encounter.   Labs Reviewed: recent ED labs, positive UDS  Collateral Obtained/Records Reviewed: discharge summary from hospital  Plan:  Continue Latuda 80 mg for psychosis Trazodone 150 mg nightly Tenex discontinued given infrequent use RTC in 3-4 months Continue CD IOP  I spent 20 minutes with the patient in direct face-to-face clinical care.  Greater than 50% of this time was spent in counseling and coordination of care with the patient.    Aundra Dubin, MD 02/22/2017, 10:04 AM

## 2017-02-23 ENCOUNTER — Other Ambulatory Visit (HOSPITAL_COMMUNITY): Payer: 59 | Admitting: Psychology

## 2017-02-23 DIAGNOSIS — F1520 Other stimulant dependence, uncomplicated: Secondary | ICD-10-CM

## 2017-02-23 DIAGNOSIS — F411 Generalized anxiety disorder: Secondary | ICD-10-CM

## 2017-02-25 ENCOUNTER — Encounter (HOSPITAL_COMMUNITY): Payer: Self-pay | Admitting: Psychology

## 2017-02-27 ENCOUNTER — Other Ambulatory Visit (HOSPITAL_COMMUNITY): Payer: 59 | Admitting: Psychology

## 2017-02-27 ENCOUNTER — Encounter (HOSPITAL_COMMUNITY): Payer: Self-pay | Admitting: Psychology

## 2017-02-27 DIAGNOSIS — F1520 Other stimulant dependence, uncomplicated: Secondary | ICD-10-CM

## 2017-02-27 NOTE — Progress Notes (Signed)
    Daily Group Progress Note  Program: CD-IOP   02/27/2017 John Robbins [id number]  Diagnosis:  No diagnosis found.   Sobriety Date: 11/5  Group Time: 1-2:30pm  Participation Level: Active  Behavioral Response: Sharing  Type of Therapy: Process Group  Interventions: Supportive  Topic: Process: The first half of group was spent in process. Members shared about their shining moments and any speed bumps they had experienced since we last met. A new group member was present, and she introduced herself and told the group about her struggles with alcoholism. The medical director met with two group members and three drug tests were collected today.  Group Time: 2:30-4pm  Participation Level: Active  Behavioral Response: Appropriate and Sharing  Type of Therapy: Mindfulness Exercise/Psycho-Education  Interventions: Strength-based  Topic: Mindfulness/Psycho-ed; Values, part 2: the second half of group began with a five-minute mindfulness exercise. It entailed breathing in and saying, 'I am breathing in' and breathing out and saying, "I have breathing out". Members had various responses at the conclusion of the exercise. The remainder of the session was spent in a psycho-ed. The topic was 'Values' and was a second part of the session begun on Monday. Members were asked to identify how they can direct their behaviors to line up with their values. There was good feedback and discussion among the group.   Summary: The patient reported he had a session with his counselor and afterwards headed to [employer]. He found a book waiting for him entitled; Daily Reflections: Addiction, Yoga and Meditation". He will read the daily reflection every day as part of his spiritual and recovery practice. The patient shared he had made plans for the weekend to ensure that he doesn't isolate or spend too much time alone. He reported he had attended the NA meeting, "F.A.T.A.L." and it had been  very intense and enjoyable. He had seen another group member there. the patient noted that the person he has begun seeing lately, called him out on his poor communication skills and he admitted he was glad he had done it and will have to work not to take things personally, but rather accept feedback to becoming a better communicator and person. The patient reported he found the mindfulness exercise less anxiety-provoking than in previous sessions. The patient shared that his values seem somewhat contradictory, including valuing stability and being adventurous. He repeated the importance of respect and agreed that respecting himself, which includes good self-care, will promote respect of others. The patient met with the medical director during the process session. He shared easily and responded well to this intervention.   UDS collected: No Results:   AA/NA attended?: Yes, Tuesday evening  Sponsor?: No   Brandon Melnick, LCAS 02/27/2017 8:21 AM

## 2017-02-28 ENCOUNTER — Encounter (HOSPITAL_COMMUNITY): Payer: Self-pay | Admitting: Psychology

## 2017-02-28 ENCOUNTER — Encounter (HOSPITAL_COMMUNITY): Payer: Self-pay | Admitting: Licensed Clinical Social Worker

## 2017-02-28 DIAGNOSIS — F411 Generalized anxiety disorder: Secondary | ICD-10-CM

## 2017-02-28 DIAGNOSIS — F1520 Other stimulant dependence, uncomplicated: Secondary | ICD-10-CM

## 2017-02-28 NOTE — Progress Notes (Signed)
   THERAPIST PROGRESS NOTE  Session Time: 3-4   Participation Level: Active  Behavioral Response: Neat and Well GroomedAlertEuthymic  Type of Therapy: Individual Therapy  Treatment Goals addressed: Anxiety and Diagnosis: SUD  Interventions: CBT and Strength-based  Summary: John Robbins is a 41 y.o. male who presents with anxiety and Methamphetamine Use Disorder. He reports he felt anxious after group yesterday since he shared so openly and deeply about his feelings of internal triggers. Pt discussed his hx of being attracted to men who "have a very dominating, large personality". Pt and counselor discussed pt's identified bx changes such as "getting better at self care" by cooking special meals for himself, getting a massage, and joining a yoga studio.  Pt identified his favorite part of group therapy so far is "the judgment free zone" and he wants to share more openly in the weeks to come.  Pt stated he went to sleep last night early and slept through the night for the first time "in a few weeks."   Suicidal/Homicidal: Nowithout intent/plan  Therapist Response: Counselor used CBT and MI to help pt identify and evoke his own reasons to change, and specific changes to make to his bx and cognitions. Pt admits he has "very negative self talk that is hyper critical and confuses selfishness w/ self care". Pt admitted he equates self care w/ self interest and this is "not a good quality". Counselor spent time encouraging pt to seek out opportunities to challenge his negative self talk and inner critic.   Plan: Return again in 1 weeks.  Diagnosis:    ICD-10-CM   1. Methamphetamine use disorder, severe (HCC) F15.20   2. Generalized anxiety disorder F41.[place] Swan, LCAS-A 02/28/2017

## 2017-02-28 NOTE — Progress Notes (Signed)
    Daily Group Progress Note  Program: CD-IOP   02/28/2017 John Robbins [id number]  Diagnosis:  Methamphetamine use disorder, severe ([place])  Generalized anxiety disorder   Sobriety Date: 11/5  Group Time: 1-2:30  Participation Level: Active  Behavioral Response: Appropriate and Sharing  Type of Therapy: Process Group  Interventions: CBT and Motivational Interviewing  Topic: Pts were active and engaged in process session in which pts were encouraged to share about their recovery from mind-altering drugs and alcohol. Pts were encouraged to consider their tx goals of sobriety, social support, and any person-centered goals when checking in.      Group Time: 2:30-4  Participation Level: Active  Behavioral Response: Appropriate and Sharing  Type of Therapy: Psycho-education Group  Interventions: CBT and Other: Sleep, Diet, Exercise tips  Topic: Pts were active and engaged in psychoeducational session with an emphasis on "sleep, diet, exercise, and general self care strategies". Tamera Reason from [facility], led a 1 hour powerpoint presentation on the subject. Pts asked multiple questions and shared openly about their struggles and successes in self care.    Summary: Pt was active and engaged in session. He reported he attended no AA meetings since yesterday. Pt reported he is looking forward to his weekend but is also feeling nervous since he will visit his boyfriend's parent's for the first time. Pt discussed his relationship w/ his boyfriend who is also in recovery (11yr sobriety) and is aware of the general rule of avoiding new relationships in the first year. Pt stated he is "taking the relationship slowly" so as to avoid overwhelming emotion. Pt was active during pschoeducation session and reported he eats gluten free, exercises near daily, and his sleep is improving.    UDS collected: No Results: negative  AA/NA attended?: No  Sponsor?: No   Wes Dashanti Burr,  LPCA LCASA 02/28/2017 4:04 PM

## 2017-03-01 ENCOUNTER — Other Ambulatory Visit (HOSPITAL_COMMUNITY): Payer: 59 | Admitting: Psychology

## 2017-03-01 ENCOUNTER — Encounter (HOSPITAL_COMMUNITY): Payer: Self-pay | Admitting: Psychology

## 2017-03-01 DIAGNOSIS — F4312 Post-traumatic stress disorder, chronic: Secondary | ICD-10-CM

## 2017-03-01 DIAGNOSIS — F1520 Other stimulant dependence, uncomplicated: Secondary | ICD-10-CM

## 2017-03-01 DIAGNOSIS — F411 Generalized anxiety disorder: Secondary | ICD-10-CM
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
## 2017-03-06 ENCOUNTER — Encounter (HOSPITAL_COMMUNITY): Payer: Self-pay | Admitting: Psychology

## 2017-03-06 ENCOUNTER — Other Ambulatory Visit (HOSPITAL_COMMUNITY): Payer: 59

## 2017-03-06 NOTE — Progress Notes (Signed)
    Daily Group Progress Note  Program: CD-IOP   03/06/2017 John Robbins [id number]  Diagnosis:  No diagnosis found.   Sobriety Date: 02/13/17  Group Time: 1-2:30pm  Participation Level: Active  Behavioral Response: Sharing  Type of Therapy: Process Group  Interventions: Supportive  Topic: Process: The first half of group was spent in process. Members shared about their experiences over the past few days and any challenges or struggles they were faced with since we last met. The medical director met with one group member. This part of group also included a review of the plans each member had for the upcoming holiday weekend.   Group Time: 2:30-4pm  Participation Level: Active  Behavioral Response: Sharing  Type of Therapy: Psycho-education Group  Interventions: Strength-based  Topic: Psycho-Ed/Graduation: The second half of group included a psycho-ed on skills to manage emotions. A "distress tolerance handout" was provided and members identified what they did or could do to manage negative emotions. The session concluded with a graduation ceremony. The graduating member's wife, daughter-in-law and brother-in-law appeared for the ceremony. It proved a very touching session with tears and grateful words expressed by the graduating member.  Summary: The patient reported he experienced a 'speed bump' this morning. He explained he had been journalling and had written down his goals and began to have a panic attack. He attempted to distract himself, but he found it very difficult and he became more anxious.  The patient reported he had plans to travel to [facility] late tomorrow. He is going out to visit a college friend. He admitted he is a little nervous, but feels like it will be fun to be out there and see a beautiful part of the country. He also reported he might be late for group next Wednesday because he has an appointment with a specialist here in [place] for his rheumatoid  arthritis. He has been waiting for almost 6 months for this appointment. In the psycho-ed, the patient identified exercise as one activity he does to distract himself from using thoughts. He also agreed that reminding himself of how he has felt differently in past times is helpful. However, he equated pushing thoughts away with a bad habit he has of 'avoiding things'. The patient was very receptive to feedback and shared openly about many of his character flaws. When asked about positive self-talk, the patient admitted he couldn't identify anything positive about himself. This was an interesting disclosure by the patient and displayed the degree of self-contempt he has for himself. The patient will be excused from group on Monday, but we would anticipate him returning at some point on Wednesday. He responded well to this intervention.   UDS collected: No Results:   AA/NA attended?: Yes, Tuesday  Sponsor?: No   Brandon Melnick, LCAS 03/06/2017 8:50 AM

## 2017-03-06 NOTE — Progress Notes (Signed)
[name] is a 41 y.o. male patient. CD-IOP: Excused Absence. The patient was absent from group today. He had made travel plans weeks before he entered the program and was flying to [facility] the evening of Thanksgiving to visit an old college friend. He is scheduled to return tomorrow and should be in group on Wednesday. He is excused from group today.        Brandon Melnick, LCAS

## 2017-03-08 ENCOUNTER — Other Ambulatory Visit (HOSPITAL_COMMUNITY): Payer: 59

## 2017-03-09 ENCOUNTER — Other Ambulatory Visit (HOSPITAL_COMMUNITY): Payer: 59 | Admitting: Psychology

## 2017-03-09 DIAGNOSIS — F1520 Other stimulant dependence, uncomplicated: Secondary | ICD-10-CM

## 2017-03-09 DIAGNOSIS — F411 Generalized anxiety disorder: Secondary | ICD-10-CM

## 2017-03-10 ENCOUNTER — Encounter (HOSPITAL_COMMUNITY): Payer: Self-pay | Admitting: Licensed Clinical Social Worker

## 2017-03-10 DIAGNOSIS — F1520 Other stimulant dependence, uncomplicated: Secondary | ICD-10-CM

## 2017-03-10 DIAGNOSIS — F411 Generalized anxiety disorder: Secondary | ICD-10-CM

## 2017-03-13 ENCOUNTER — Other Ambulatory Visit (HOSPITAL_COMMUNITY): Payer: 59 | Attending: Psychiatry | Admitting: Psychology

## 2017-03-13 ENCOUNTER — Encounter (HOSPITAL_COMMUNITY): Payer: Self-pay | Admitting: Psychology

## 2017-03-13 DIAGNOSIS — F411 Generalized anxiety disorder: Secondary | ICD-10-CM

## 2017-03-13 DIAGNOSIS — Z79899 Other long term (current) drug therapy: Secondary | ICD-10-CM | POA: Insufficient documentation

## 2017-03-13 DIAGNOSIS — F15259 Other stimulant dependence with stimulant-induced psychotic disorder, unspecified: Secondary | ICD-10-CM | POA: Insufficient documentation

## 2017-03-13 DIAGNOSIS — I7300 Raynaud's syndrome without gangrene: Secondary | ICD-10-CM | POA: Diagnosis not present

## 2017-03-13 DIAGNOSIS — F4312 Post-traumatic stress disorder, chronic: Secondary | ICD-10-CM | POA: Diagnosis not present

## 2017-03-13 DIAGNOSIS — F1520 Other stimulant dependence, uncomplicated: Secondary | ICD-10-CM

## 2017-03-13 DIAGNOSIS — R569 Unspecified convulsions: Secondary | ICD-10-CM | POA: Diagnosis not present

## 2017-03-13 DIAGNOSIS — R440 Auditory hallucinations: Secondary | ICD-10-CM | POA: Insufficient documentation

## 2017-03-13 DIAGNOSIS — F1910 Other psychoactive substance abuse, uncomplicated: Secondary | ICD-10-CM | POA: Insufficient documentation

## 2017-03-13 DIAGNOSIS — R443 Hallucinations, unspecified: Secondary | ICD-10-CM | POA: Diagnosis present

## 2017-03-13 NOTE — Progress Notes (Signed)
    Daily Group Progress Note  Program: CD-IOP   03/13/2017 Kamuela Magos [id number]  Diagnosis:  Methamphetamine use disorder, severe ([place])  Generalized anxiety disorder   Sobriety Date: 11-5  Group Time: 1-2:30  Participation Level: Active  Behavioral Response: Appropriate, Sharing and "upset stomach"  Type of Therapy: Individual Therapy  Interventions: CBT and Solution Focused  Topic: Pts were active and engaged in group process session. Counselors led pt's in a round table check in, discussion of topical recovery-based items, and asked pts to point out their challenges and successes in early recovery. Pts were asked to comment on their targeted goals in tx including sobriety and building social support.      Group Time: 2:30-4  Participation Level: Active  Behavioral Response: Appropriate and Sharing  Type of Therapy: Psycho-education Group  Interventions: CBT and Supportive  Topic: Pts were active and engaged in group psychoeducation session. Counselors led pt's in a lesson on "discovering and challenging negative thought patterns" from Group Treatment for Substance Abuse, 2nd edition. Handouts were provided and pts wrote down their personal negative self talk phrases. Pt's were instructed on how to "catch negative self talk" before it escalates. Pts were taught how to distract and/or challenge negative thinking.   Summary: Pt was active and engaged in session. He stated he was still feeling nauseous and depressed from his recent exposure to gluten last week. Pt admitted this puts him "in a bad head space". Pt shared about his time since returning from [place] for a vacation w/ a friend. His trip did not go as expected since he got sick and his friend was "still living like we did in college". Pt shared that he did attend AA while in [place] and found it "very upsetting" due to the high degree of homelessness and "heartwrenching stories". Pt shared about his  "family support" which he is working to increase. Currently, pt does not get much help from his family since they are 2 hours away. Pt reported he is "thinking about making [facility] his homegroup in AA" since he enjoys the meditation.    UDS collected: Yes Results: pending  AA/NA attended?: YesThursday, Friday and Saturday  Sponsor?: No   [name], LPCA LCASA 03/13/2017 12:02 PM

## 2017-03-14 ENCOUNTER — Encounter (HOSPITAL_COMMUNITY): Payer: Self-pay | Admitting: Licensed Clinical Social Worker

## 2017-03-14 NOTE — Progress Notes (Signed)
   THERAPIST PROGRESS NOTE  Session Time: 1-2pm  Participation Level: Active  Behavioral Response: Neat and Well GroomedAlertDepressed  Type of Therapy: Individual Therapy  Treatment Goals addressed: Anxiety and Coping  Interventions: CBT, Strength-based and Supportive  Summary: John Robbins is a 41 y.o. male who presents with anxiety, poor self-worth, and Methamphetamine Use Disorder. He is flat and somewhat disengaged from session today. He reports he is still feeling physically sick from a recent exposure to gluten which is causing him to vomit up his food regularly throughout the day. He is eating small portions of tolerable foods to augment the discomfort.  Pt and counselor discuss pt's goals of sobriety, social support, and developing a mindfulness and meditation practice 3x weekly.  Pt reports he is still getting anxious when he goes to [employer]. He struggles w/ "wanting to help everyone he hears that is hurting in any way". He has insight into the irrational nature of his thought process and co-depenency, yet he struggles to manage it.   Counselor uses CBT-based questions to build insight into the nature of pt's self talk. Pt reports he frequently gets overwhelmed and tells himself he has to be the one to do something for people. He stated he does enjoy the meditation-based AA meetings and does not have anxiety there. 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  Suicidal/Homicidal: Nowithout intent/plan  Therapist Response: Counselor used CBT, open questions, and Socratic questioning to help pt gain insight into his anxiety, self talk, and debilitating perfectionistic thinking.   Plan: Return again in 1 week.  Diagnosis:    ICD-10-CM   1. Methamphetamine use disorder, severe (HCC) F15.20   2. Generalized anxiety disorder F41.Eleele Swan, LCAS-A 03/14/2017

## 2017-03-15 ENCOUNTER — Encounter (HOSPITAL_COMMUNITY): Payer: Self-pay | Admitting: Psychology

## 2017-03-15 ENCOUNTER — Other Ambulatory Visit (HOSPITAL_COMMUNITY): Payer: 59 | Admitting: Psychology

## 2017-03-15 DIAGNOSIS — F4312 Post-traumatic stress disorder, chronic: Secondary | ICD-10-CM

## 2017-03-15 DIAGNOSIS — F411 Generalized anxiety disorder: Secondary | ICD-10-CM

## 2017-03-15 DIAGNOSIS — F1520 Other stimulant dependence, uncomplicated: Secondary | ICD-10-CM

## 2017-03-15 DIAGNOSIS — F15259 Other stimulant dependence with stimulant-induced psychotic disorder, unspecified: Secondary | ICD-10-CM | POA: Diagnosis not present
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
## 2017-03-16 ENCOUNTER — Other Ambulatory Visit (HOSPITAL_COMMUNITY): Payer: 59 | Admitting: Psychology

## 2017-03-16 ENCOUNTER — Encounter (HOSPITAL_COMMUNITY): Payer: Self-pay | Admitting: Psychology

## 2017-03-16 ENCOUNTER — Encounter (HOSPITAL_COMMUNITY): Payer: Self-pay | Admitting: Licensed Clinical Social Worker

## 2017-03-16 DIAGNOSIS — F411 Generalized anxiety disorder: Secondary | ICD-10-CM

## 2017-03-16 DIAGNOSIS — F15259 Other stimulant dependence with stimulant-induced psychotic disorder, unspecified: Secondary | ICD-10-CM | POA: Diagnosis not present

## 2017-03-16 DIAGNOSIS — R21 Rash and other nonspecific skin eruption: Secondary | ICD-10-CM | POA: Diagnosis not present

## 2017-03-16 DIAGNOSIS — F4312 Post-traumatic stress disorder, chronic: Secondary | ICD-10-CM

## 2017-03-16 DIAGNOSIS — F1520 Other stimulant dependence, uncomplicated: Secondary | ICD-10-CM

## 2017-03-16 DIAGNOSIS — M2550 Pain in unspecified joint: Secondary | ICD-10-CM | POA: Diagnosis not present

## 2017-03-16 DIAGNOSIS — K900 Celiac disease: Secondary | ICD-10-CM | POA: Diagnosis not present
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
## 2017-03-16 NOTE — Progress Notes (Signed)
CD-IOP INDIVIDUAL SESSION  THERAPIST PROGRESS NOTE  Session Time: 4-5pm  Participation Level: Active  Behavioral Response: Meticulous, Neat and Well GroomedAlertEuthymic  Type of Therapy: Individual Therapy  Treatment Goals addressed: Anxiety and Coping  Interventions: CBT, Strength-based and Supportive  Summary: John Robbins is a 40 y.o. male who presents with Methamphetamine Use Disorder. Pt was active and engaged for session. He presented as more calm and in a relaxed posture than previous sessions. Pt stated he wanted to talk about an ongoing conflict w/ his brother. Pt reported his brother has caused pt distress since they were little boys. Pt always felt he had to be good and do the right thing while his brother got to break all the rules. Pt stated his brother has Bipolar and it is unmanaged which causes significant distress w/i his family. Pt and counselor spent time discussing pt's plan to avoid a large christmas gathering that his brother will be attending. Pt discussed the guilt he feels when other people are disrespectful towards friends/family.  Pt reported he feels "very angry" w/ his brother when his brother disrespects the family.  Suicidal/Homicidal: Nowithout intent/plan  Therapist Response: Counselor used CBT to help pt gain insight into his person-centered goals. Counselor assessed pt level of functioning which appears to be moderately high.  Plan: Return again in 1 weeks.  Diagnosis:    ICD-10-CM   1. Methamphetamine use disorder, severe (HCC) F15.20   2. Generalized anxiety disorder F41.Arbovale Swan, LCAS-A 03/16/2017

## 2017-03-17 ENCOUNTER — Encounter (HOSPITAL_COMMUNITY): Payer: Self-pay | Admitting: Psychology

## 2017-03-17 NOTE — Progress Notes (Signed)
    Daily Group Progress Note  Program: CD-IOP   03/17/2017 Izick Gasbarro [id number]  Diagnosis:  Methamphetamine use disorder, severe (H[facility])  Generalized anxiety disorder  Chronic post-traumatic stress disorder (PTSD)   Sobriety Date: 11/5  Group Time: 1-2:30  Participation Level: Active  Behavioral Response: Appropriate and Sharing  Type of Therapy: Process Group  Interventions: CBT  Topic: Pts were active and engaged in process session. Counselors encouraged pts to discuss their feelings and thoughts in early recovery. Counselors helped pt identify their work towards thier tx goals of sobriety and increased social support      Group Time: 2:30-4  Participation Level: Active  Behavioral Response: Appropriate and Sharing  Type of Therapy: Psycho-education Group  Interventions: CBT and Assertiveness Training  Topic: Pts were active and engaged in psychoeducation session. Counselors led training on commuincation, conflict resolution, assertiveness role plays.     Summary: Pt was active and engaged in session. He reported he continues to feel good and hopeful. He attended 1 new meeting last night and, despite feeling somewhat uncomfortable, "really enjoyed it" and stayed late to talk w/ new friends in recovery. Pt stated he was planning to attend [facility] that night. He stated he recently had flashbacks to when he was using IV Meth, when a nurse asked him about his veins. Pt admitted it scared him and made him realize how much his anxiety had decreased since he stopped using. Pt modeled assertiveness w/ a calm and somewhat robotic tone.   UDS collected: No Results: negative  AA/NA attended?: YesThursday  Sponsor?: No   Wes Swan, LPCA LCASA 03/17/2017 1:17 PM

## 2017-03-20 ENCOUNTER — Other Ambulatory Visit (HOSPITAL_COMMUNITY): Payer: 59

## 2017-03-21 ENCOUNTER — Telehealth (HOSPITAL_COMMUNITY): Payer: Self-pay | Admitting: Licensed Clinical Social Worker
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
## 2017-03-22 ENCOUNTER — Other Ambulatory Visit (HOSPITAL_COMMUNITY): Payer: 59 | Admitting: Psychology

## 2017-03-22 DIAGNOSIS — F4312 Post-traumatic stress disorder, chronic: Secondary | ICD-10-CM

## 2017-03-22 DIAGNOSIS — F411 Generalized anxiety disorder: Secondary | ICD-10-CM

## 2017-03-22 DIAGNOSIS — F1520 Other stimulant dependence, uncomplicated: Secondary | ICD-10-CM

## 2017-03-22 DIAGNOSIS — F15259 Other stimulant dependence with stimulant-induced psychotic disorder, unspecified: Secondary | ICD-10-CM | POA: Diagnosis not present

## 2017-03-23 ENCOUNTER — Encounter (HOSPITAL_COMMUNITY): Payer: Self-pay | Admitting: Licensed Clinical Social Worker

## 2017-03-24 ENCOUNTER — Encounter (HOSPITAL_COMMUNITY): Payer: Self-pay | Admitting: Psychology

## 2017-03-24 ENCOUNTER — Telehealth (HOSPITAL_COMMUNITY): Payer: Self-pay | Admitting: Licensed Clinical Social Worker

## 2017-03-24 ENCOUNTER — Encounter (HOSPITAL_COMMUNITY): Payer: Self-pay | Admitting: Licensed Clinical Social Worker
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
## 2017-03-24 NOTE — Progress Notes (Signed)
John Robbins is a 41 y.o. male patient who presented as disheveled and somewhat detached emotionally. He appeared for CD-IOP as scheduled but left 5 mins into session due to being confronted w/ a positive UDS result. Pt stated "this test says I'm a liar and I am always hearing that from the voices in my head". Pt left abruptly despite request from counselor to stay in session. Counselor called and left VM asking pt to call him as soon as he felt able to.        Archie Balboa, LCAS-A

## 2017-03-24 NOTE — Telephone Encounter (Signed)
Called pt Thursday 03/23/17 at 1:30pm after pt abruptly left group when confronted by positive UDS results, no answer left message asking pt to return call.   Called Friday 03/24/17 at 9:15 w/ no answer, asking pt to return call.

## 2017-03-29 ENCOUNTER — Encounter (HOSPITAL_COMMUNITY): Payer: Self-pay | Admitting: Licensed Clinical Social Worker

## 2017-03-29 DIAGNOSIS — F4312 Post-traumatic stress disorder, chronic: Secondary | ICD-10-CM

## 2017-03-29 DIAGNOSIS — F411 Generalized anxiety disorder: Secondary | ICD-10-CM

## 2017-03-29 DIAGNOSIS — F1520 Other stimulant dependence, uncomplicated: Secondary | ICD-10-CM

## 2017-03-30 ENCOUNTER — Encounter (HOSPITAL_COMMUNITY): Payer: Self-pay | Admitting: Licensed Clinical Social Worker

## 2017-03-30 NOTE — Progress Notes (Signed)
Thank you :)
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
## 2017-04-06 ENCOUNTER — Other Ambulatory Visit (HOSPITAL_COMMUNITY): Payer: 59 | Admitting: Psychology

## 2017-04-06 DIAGNOSIS — F4312 Post-traumatic stress disorder, chronic: Secondary | ICD-10-CM

## 2017-04-06 DIAGNOSIS — F15259 Other stimulant dependence with stimulant-induced psychotic disorder, unspecified: Secondary | ICD-10-CM | POA: Diagnosis not present

## 2017-04-06 DIAGNOSIS — F1520 Other stimulant dependence, uncomplicated: Secondary | ICD-10-CM

## 2017-04-07 ENCOUNTER — Encounter (HOSPITAL_COMMUNITY): Payer: Self-pay | Admitting: Licensed Clinical Social Worker

## 2017-04-07 ENCOUNTER — Telehealth (HOSPITAL_COMMUNITY): Payer: Self-pay | Admitting: Licensed Clinical Social Worker

## 2017-04-07 NOTE — Progress Notes (Signed)
John Robbins is a 41 y.o. male patient. Counselor met w/ pt for an unplanned 30 min advice-only, individual session. Pt presented as engaged in session though he was admitantly distracted and nervous due to "hearing voices in his head that are getting louder". Pt stated he feels "very confident he will not be compelled to return to IV Meth" and is looking for help beyond just SA and CD-IOP. Counselor encouraged pt to reach out to his supports, enact his safety plan if necessary, and return to group Monday 04/10/17 to meet w/ program medical director Darlyne Russian to discuss ongoing plan for possible referral, medication change, and/or both.  Pt reported he has felt some SI in past few days but states he does not want to die because he likes his life and loves his family.        Archie Balboa, LCAS-A

## 2017-04-07 NOTE — Telephone Encounter (Signed)
Pt was advised to consider taking his Prolixin for his auditory hallucinations as needed. Pt was agreeable to this and stated he was in positive mood and had a plan for his weekend to stay sober and avoid hallucinations.

## 2017-04-10 ENCOUNTER — Other Ambulatory Visit (INDEPENDENT_AMBULATORY_CARE_PROVIDER_SITE_OTHER): Payer: 59 | Admitting: Psychology

## 2017-04-10 ENCOUNTER — Encounter (HOSPITAL_COMMUNITY): Payer: Self-pay | Admitting: Medical

## 2017-04-10 ENCOUNTER — Encounter (HOSPITAL_COMMUNITY): Payer: Self-pay | Admitting: Psychology

## 2017-04-10 DIAGNOSIS — R825 Elevated urine levels of drugs, medicaments and biological substances: Secondary | ICD-10-CM

## 2017-04-10 DIAGNOSIS — I7300 Raynaud's syndrome without gangrene: Secondary | ICD-10-CM

## 2017-04-10 DIAGNOSIS — F15259 Other stimulant dependence with stimulant-induced psychotic disorder, unspecified: Secondary | ICD-10-CM | POA: Diagnosis not present

## 2017-04-10 DIAGNOSIS — H9012 Conductive hearing loss, unilateral, left ear, with unrestricted hearing on the contralateral side: Secondary | ICD-10-CM

## 2017-04-10 DIAGNOSIS — K900 Celiac disease: Secondary | ICD-10-CM

## 2017-04-10 DIAGNOSIS — R440 Auditory hallucinations: Secondary | ICD-10-CM

## 2017-04-10 DIAGNOSIS — F1520 Other stimulant dependence, uncomplicated: Secondary | ICD-10-CM

## 2017-04-10 DIAGNOSIS — F4312 Post-traumatic stress disorder, chronic: Secondary | ICD-10-CM

## 2017-04-10 DIAGNOSIS — F19951 Other psychoactive substance use, unspecified with psychoactive substance-induced psychotic disorder with hallucinations: Secondary | ICD-10-CM

## 2017-04-10 DIAGNOSIS — B20 Human immunodeficiency virus [HIV] disease: Secondary | ICD-10-CM

## 2017-04-10 MED ORDER — FLUPHENAZINE HCL 5 MG PO TABS: ORAL_TABLET | ORAL | 1 refills | Status: DC

## 2017-04-10 NOTE — Progress Notes (Signed)
[facility] MD/PA/NP OP Progress Note  04/10/2013:54 PM John Robbins  MRN:  [id number]  Chief Complaint:  Chief Complaint    Follow-up; Addiction Problem; Hallucinations; Trauma; Stress     HPI: Pt seen today on return to CD IOP after leaving group when queried about + UDS: 03/23/2017 Progress Notes by John Robbins, LCAS-A at 03/23/2017 11:59 PM   Author: Archie Robbins, LCAS-A Author Type: Counselor Filed: 03/24/2017 12:55 PM  Note Status: Signed Cosign: Cosign Not Required Encounter Date: 03/23/2017  Editor: John Robbins, LCAS-A (Counselor)  Important Sensitive Note    John Robbins is a 41 y.o. male patient who presented as disheveled and somewhat detached emotionally. He appeared for CD-IOP as scheduled but left 5 mins into session due to being confronted w/ a positive UDS result. Pt stated "this test says I'm a liar and I am always hearing that from the voices in my head". Pt left abruptly despite request from counselor to stay in session. Counselor called and left VM asking pt to call him as soon as he felt able to.    Pt remained incommunicado and on 03/30/2017 Counselor considered discharge from [facility] Notes by John Robbins, LCAS-A at 03/29/2017 11:59 PROGRESS IN TREATMENT: Pt made some progress towards going to AA/NA meetings at least 4 times per week. Pt was seemingly open and sharing during group and discussed his anxiety, "the voices in his head" and how he was "very codependent" in all of his previous relationships. Pt did start a new relationship while in tx (or just before starting tx) despite recommendation to focus on himself in this stage of recovery. Pt had negative UDS results until 1 month into tx when he tested positive for Morphine and ETOH. When confronted by this test, pt became highly defensive and left group abruptly saying "I don't need more people in my life calling me a liar".   PROGNOSIS: Poor. Pt left on negative terms and has not f/u  w/ providers or CD-IOP tx team. Counselors have made multiple attempts to contact him, leave VM encouraging him to re-engage in CD-IOP w/ no results. It is our recommendation that Pt seek a higher level of care at [facility] in their [place] program.  Pt was subsequently found to have returned home to mother.He reappeared 04/07/2017 requesting to see Counselor: Progress Notes by John Robbins, LCAS-A at 04/07/2017 11:28 AM  Sensitive Note   John Robbins is a 41 y.o. male patient. Counselor met w/ pt for an unplanned 30 min advice-only, individual session. Pt presented as engaged in session though he was admitantly distracted and nervous due to "hearing voices in his head that are getting louder". Pt stated he feels "very confident he will not be compelled to return to IV Meth" and is looking for help beyond just SA and CD-IOP. Counselor encouraged pt to reach out to his supports, enact his safety plan if necessary, and return to group Monday 04/10/17 to meet w/ program medical director John Robbins to discuss ongoing plan for possible referral, medication change, and/or both.  Pt reported he has felt some SI in past few days but states he does not want to die because he likes his life and loves his family.     In speaking with him today he admits he was not taking his HS PRN antipsychotic for voices. He has now begun to take his Prolixin 18m HS. When initially seen at intake 02/20/2017 pt was unclear  whether he was actually having auditory hallucinations or if the voices were of his own subconscious. Today he reports he has a mixture and that the hallucinations have begun to respond to medication. He also reports a sense of paranoia about those around him-feeling they are talking about him/judging him. The voices are also critical but not commanding. The onset of his hallucinations coincides with his IV Methamphetamine addiction age 20. There is a history of schizophrenia in his  family.  In terms of his + UDS the patient admits today that he has no clear recollection of his behaviors during the time of the incident due to stress. He no longer denies the possibility the test is accurate. At one point he requested a 5 day program of treatment and John Robbins was suggested. On his return to counselor pt related and note reports pt felt he no longer had a problem with addiction and that his earlier request for a 5 day program was rescinded as he didnt feel he could drive every day to [facility] especially since he felt he was over his addiction.  Visit Diagnosis:    ICD-10-CM   1. Methamphetamine use disorder, severe (HCC) F15.20   2. Amphetamine and psychostimulant dependence, abuse ([place]) F15.20   3. Chronic post-traumatic stress disorder (PTSD) F43.12   4. Verbal auditory hallucination R44.0   5. Substance-induced psychotic disorder with hallucinations ([place]) F19.951   6. Human immunodeficiency virus (HIV) disease ([place]) B20   7. Conductive hearing loss of left ear with unrestricted hearing of right ear H90.12   8. Raynaud's disease without gangrene I73.00   9. Celiac disease K90.0   10. Positive urine drug screen R82.5     Past Psychiatric History: *From Initial consult  Past Medical History:  Past Medical History:  Diagnosis Date  . Allergy   . Anxiety   . Celiac disease   . History of substance abuse   . HIV infection ([place])   . Raynaud disease   . Seizures ([place])     Past Surgical History:  Procedure Laterality Date  . WISDOM TOOTH EXTRACTION       Family Psychiatric History:from initial consult  Alcohol/drugs-PGF;Paternal Uncle;Brother  Bipolar/ADD - Broth  Father- describes as "Workaholic"  Family History:  Family History  Problem Relation Age of Onset  . Heart disease Father 38  . Hypertension Father   . Heart disease Maternal Grandfather        early 36s  . Hypertension Maternal Grandfather   . Hypertension Mother   . Rheum arthritis  Mother   . Crohn's disease Mother   . Crohn's disease Sister   . Rheum arthritis Maternal Grandmother   . Breast cancer Maternal Grandmother   . Colon cancer Paternal Grandmother   . Hypertension Paternal Grandfather   . Esophageal cancer Neg Hx   . Stomach cancer Neg Hx     Social History:  Social History   Socioeconomic History  . Marital status: Unknown    Spouse name: None  . Number of children: None  . Years of education: None  . Highest education level: None  Social Needs  . Financial resource strain: None  . Food insecurity - worry: None  . Food insecurity - inability: None  . Transportation needs - medical: None  . Transportation needs - non-medical: None  Occupational History  . None  Tobacco Use  . Smoking status: Never Smoker  . Smokeless tobacco: Never Used  Substance and Sexual Activity  . Alcohol  use: Yes    Alcohol/week: 1.2 oz    Types: 2 Glasses of wine per week    Comment: occ  . Drug use: No    Comment: Hx of maijuana, ecstacy, GHB, ketamine, amephetamines, cocaine - nothing since 2009  . Sexual activity: Yes    Partners: Male    Comment: Uses Condoms  Other Topics Concern  . None  Social History Narrative   Works in Engineer, technical sales.    Allergies:  Allergies  Allergen Reactions  . Abilify [Aripiprazole] Other (See Comments)    Akathesia  . Gluten Meal Other (See Comments)    Neurological    Metabolic Disorder Labs: Lab Results  Component Value Date   HGBA1C 5.0 02/08/2017   MPG 96.8 02/08/2017   No results found for: PROLACTIN Lab Results  Component Value Date   CHOL 184 02/08/2017   TRIG 223 (H) 02/08/2017   HDL 43 02/08/2017   CHOLHDL 4.3 02/08/2017   VLDL 45 (H) 02/08/2017   LDLCALC 96 02/08/2017   LDLCALC 96 11/30/2015   Lab Results  Component Value Date   TSH 0.982 02/08/2017   TSH 3.330 08/05/2016    Therapeutic Level Labs: No results found for: LITHIUM No results found for: VALPROATE No components found for:   CBMZ  Current Medications: Current Outpatient Medications  Medication Sig Dispense Refill  . ABILIFY MAINTENA 300 MG SRER INJ 1 ML IM Q 30 DAYS  11  . fluconazole (DIFLUCAN) 200 MG tablet     . fluPHENAZine (PROLIXIN) 5 MG tablet TAKE 1 TABLET BY MOUTH AT BEDTIME AS NEEDED FOR HALLUCINATIONS 90 tablet 1  . guanFACINE (TENEX) 1 MG tablet TAKE 1 TABLET (1 MG TOTAL) BY MOUTH 3 (THREE) TIMES DAILY.  1  . lurasidone (LATUDA) 80 MG TABS tablet Take 1 tablet (80 mg total) at bedtime by mouth. 90 tablet 1  . traZODone (DESYREL) 150 MG tablet Take 1 tablet (150 mg total) at bedtime by mouth. 90 tablet 1  . triamcinolone cream (KENALOG) 0.1 % APPLY TO AFFECTED AREA TWICE DAILY AS NEEDED  1  . TRIUMEQ 600-50-300 MG tablet TAKE 1 TABLET BY MOUTH ONCE EVERY DAY 90 tablet 2   No current facility-administered medications for this visit.      Musculoskeletal: Strength & Muscle Tone: within normal limits Gait & Station: normal Patient leans: N/A  Psychiatric Specialty Exam: Review of Systems  Constitutional: Positive for malaise/fatigue. Negative for chills, diaphoresis, fever and weight loss.  HENT: Positive for nosebleeds. Negative for hearing loss and tinnitus.   Eyes: Negative.  Negative for blurred vision and double vision.  Respiratory: Negative for cough, shortness of breath and wheezing.   Cardiovascular: Negative.  Negative for chest pain, palpitations, leg swelling and PND.  Gastrointestinal: Negative for abdominal pain, constipation, diarrhea, heartburn, nausea and vomiting.  Genitourinary: Negative.  Negative for dysuria, flank pain, frequency, hematuria and urgency.       HIV +  Musculoskeletal: Negative.  Negative for back pain, falls, joint pain, myalgias and neck pain.  Skin: Negative for itching and rash.  Neurological: Negative.  Negative for dizziness, tingling, tremors, sensory change, speech change, focal weakness, seizures, loss of consciousness, weakness and headaches.   Endo/Heme/Allergies: Negative for environmental allergies and polydipsia. Does not bruise/bleed easily.       HIV  Psychiatric/Behavioral: Positive for depression, hallucinations, memory loss and substance abuse. Negative for suicidal ideas (Has had but None now). The patient is nervous/anxious and has insomnia.     There were  no vitals taken for this visit.There is no height or weight on file to calculate BMI.  General Appearance: Neat and Well Groomed  Eye Contact:  Good  Speech:  Clear and Coherent  Volume:  Normal  Mood:  Dysphoric  Affect:  Congruent  Thought Process:  Coherent and Descriptions of Associations: Intact  Orientation:  Full (Time, Place, and Person)  Thought Content: Logical, Illogical, Hallucinations: Auditory, Obsessions, Paranoid Ideation and Rumination   Suicidal Thoughts:  No  Homicidal Thoughts:  No  Memory:  Traumatic  Judgement:  Impaired  Insight:  Lacking  Psychomotor Activity:  Normal  Concentration:  Attention Span: ON Guanfacine  Recall:  Warrior of Knowledge: Good  Language: Good  Akathisia:  NA  Handed:  Right  AIMS (if indicated): done  Assets:  Desire for Improvement Financial Resources/Insurance Housing Resilience Social Support Talents/Skills Transportation Vocational/Educational  ADL's:  Impaired Out of work  on disability  Cognition: Impaired,  Moderate  Sleep:  On trazodone   Screenings: AIMS     Admission (Discharged) from 02/07/2017 in [facility] Total Score  0    AUDIT     Counselor from 02/14/2017 in [facility] Admission (Discharged) from 02/07/2017 in [facility]  Alcohol Use Disorder Identification Test Final Score (AUDIT)  2  3    GAD-7     Counselor from 02/23/2017 in [facility] Counselor from 02/14/2017 in [facility]  Total GAD-7 Score  6  15    PHQ2-9      Counselor from 02/23/2017 in [place] [facility] Counselor from 02/14/2017 in [place] Office Visit from 12/08/2016 in [facility] Office Visit from 09/20/2016 in [facility] Visit from 08/26/2016 in [place]  PHQ-2 Total Score  2  2  0  0  2  PHQ-9 Total Score  9  9  No data  No data  12       Assessment  Methamphetamine induced psychotic disorder Methamphetamine addiction Polysubstance abuse with + UDS in treatment PTSD with chronic anxious depression    Plan:  Treatment Plan/Recommendations:  Plan of Care: Resume CDIOP-pt informed he is NOT over his Meth addiction-Developmental Model of Recovery to be provided by Counselor Pt advised he may enter Psych CD IOP/Partial Hosp if he successfully does CDIOP  Laboratory:  UDS per protocol/Others with PCP/Specialist providers  Psychotherapy: CD IOP Individual;Group and Family  Medications: per Dr Daron Offer- Changed Prolixin to QHS from PRN and refilled  Routine PRN Medications:  No  Consultations: Not at this time  Safety Concerns: Relapse/Progressive psychosis  Other:  NA    John Russian, PA-C 04/12/2017, 1:55 PM

## 2017-04-10 NOTE — Progress Notes (Signed)
    Daily Group Progress Note  Program: CD-IOP   04/10/2017 John Robbins [id number]  Diagnosis:    Sobriety Date: 03/23/17  Group Time: 1-2:30pm  Participation Level: Active  Behavioral Response: Sharing  Type of Therapy: Process Group  Interventions: Supportive  Topic: Process: The first half of group was spent in process. Members shared about the past holiday weekend and any challenges or successes they had experienced since we last met. A new group member was present and the member missing from all last week appeared today. Drug tests were collected from everyone present. A PHQ-9 and GAD-7 were both administered and collected from members.  Group Time: 2:30-4pm  Participation Level: Active  Behavioral Response: Sharing  Type of Therapy: Psycho-education Group  Interventions: Family Systems  Topic: Psycho-Ed: 'Families in early recovery'. The second session began with the new group member introducing herself. She described a difficult and painful path of residential treatment, sobriety and then relapse. She shared about a complicated childhood with an alcoholic mother and her nuclear family, which includes enmeshment, codependency and enabling. Whatever the role, everyone is very angry. These disclosures prompted other group members to share about their families and the problems they have faced in early recovery. counselors described some of the more common behaviors displayed by family in early recovery, but emphasized that each member focus on him/herself and address his/ her dysfunctional and unhealthy behaviors and patterns. The group session was intense, and members all seemed to have found the session very informative and compelling.  Summary: The patient appeared today in group after missing all of last week. His fellow group members greeted him warmly. When it was his turn to share, the patient reported he had spent almost all of his time since he was last  here in [place] at his mother's home. He felt safe, but admitted his family dynamics are complicated and unhealthy and he 'tried not to take on too much during family gatherings'. The patient reported he is concerned about this evening because it will be the first time he is by himself at his apartment and he does not do well alone. He reported he has made a plan, including having made a list of facts that are indisputable so he can refer to the list when he is being challenged by the voices in his head. He reported the voices start slow but then become louder and more negative and critical. The patient was quiet, but attentive when not sharing. He appeared very fragile and group members were very supportive towards him and his battles. He responded well to this first session back in over a week.    UDS collected: No Results:   AA/NA attended?: [place], Friday and Sunday  Sponsor?: No   Brandon Melnick, LCAS 04/10/2017 9:37 AM

## 2017-04-12 ENCOUNTER — Other Ambulatory Visit (HOSPITAL_COMMUNITY): Payer: 59 | Attending: Psychiatry | Admitting: Psychology

## 2017-04-12 DIAGNOSIS — F4312 Post-traumatic stress disorder, chronic: Secondary | ICD-10-CM | POA: Diagnosis not present

## 2017-04-12 DIAGNOSIS — F411 Generalized anxiety disorder: Secondary | ICD-10-CM

## 2017-04-12 DIAGNOSIS — K900 Celiac disease: Secondary | ICD-10-CM | POA: Diagnosis not present

## 2017-04-12 DIAGNOSIS — F15251 Other stimulant dependence with stimulant-induced psychotic disorder with hallucinations: Secondary | ICD-10-CM | POA: Diagnosis not present

## 2017-04-12 DIAGNOSIS — Z8249 Family history of ischemic heart disease and other diseases of the circulatory system: Secondary | ICD-10-CM | POA: Diagnosis not present

## 2017-04-12 DIAGNOSIS — Z8379 Family history of other diseases of the digestive system: Secondary | ICD-10-CM | POA: Diagnosis not present

## 2017-04-12 DIAGNOSIS — Z888 Allergy status to other drugs, medicaments and biological substances status: Secondary | ICD-10-CM | POA: Insufficient documentation

## 2017-04-12 DIAGNOSIS — F1520 Other stimulant dependence, uncomplicated: Secondary | ICD-10-CM

## 2017-04-12 DIAGNOSIS — Z818 Family history of other mental and behavioral disorders: Secondary | ICD-10-CM | POA: Insufficient documentation

## 2017-04-12 DIAGNOSIS — F15259 Other stimulant dependence with stimulant-induced psychotic disorder, unspecified: Secondary | ICD-10-CM | POA: Diagnosis present

## 2017-04-12 DIAGNOSIS — Z21 Asymptomatic human immunodeficiency virus [HIV] infection status: Secondary | ICD-10-CM | POA: Diagnosis not present

## 2017-04-12 DIAGNOSIS — Z811 Family history of alcohol abuse and dependence: Secondary | ICD-10-CM | POA: Diagnosis not present

## 2017-04-12 DIAGNOSIS — Z79899 Other long term (current) drug therapy: Secondary | ICD-10-CM | POA: Diagnosis not present

## 2017-04-12 DIAGNOSIS — Z8261 Family history of arthritis: Secondary | ICD-10-CM | POA: Diagnosis not present

## 2017-04-12 DIAGNOSIS — F418 Other specified anxiety disorders: Secondary | ICD-10-CM | POA: Diagnosis not present

## 2017-04-12 NOTE — Progress Notes (Signed)
    Daily Group Progress Note  Program: CD-IOP   04/12/2017 Shareef Demetrion [place] [id number]  Diagnosis:  Methamphetamine use disorder, severe (HCC)  Amphetamine and psychostimulant dependence, abuse (HCC)  Chronic post-traumatic stress disorder (PTSD)  Verbal auditory hallucination  Substance-induced psychotic disorder with hallucinations ([place])  Human immunodeficiency virus (HIV) disease ([place])  Conductive hearing loss of left ear with unrestricted hearing of right ear  Raynaud's disease without gangrene  Celiac disease   Sobriety Date: 03/23/17  Group Time: 1-2:30pm  Participation Level: Active  Behavioral Response: Appropriate and Sharing  Type of Therapy: Process Group  Interventions: Supportive  Topic: Process: the first half of group was spent in process. The group had not met since last Thursday and there was a lot to disclose and discuss. One group member checked-in with today as her sobriety date. The process of her relapse was discussed, and new members educated about how one relapses before actually relapsing.  The member pointed out she wasn't beating herself up and felt as if it had reaffirmed her inability to manage alcohol in any manner. The medical director met with three group members, including the newest one during the session today.   Group Time: 2:30-4pm  Participation Level: Active  Behavioral Response: Sharing  Type of Therapy: Psycho-education Group  Interventions: Motivational Interviewing  Topic: Psycho-Ed: Mind-mapping; Having Fun. The second half of group was spent in a psycho-ed. Members were provided a handout asking them to identify how they experience 'fun'. Members brainstormed, and their answers were recorded on the board. They were able to identify many different activities, but across the board, members admitted these had been pushed to the side as a result of their addiction. The psych-ed concluded with members identifying at  least two activities they would like to focus on and develop further in the new year. The session ended with members sharing about "New Year's" plans and everyone appeared prepared to get through this final ritual this evening.   Summary: Patient reported he is feeling 'normal' again. He is grateful but noted he has been doing his mediations and lots of journaling and is experiencing the benefits of those practices. The patient shared that he had attended the NA meeting he likes last week and was able to switch into 'quiet mind'. He described it as a 'switch going off' and reported he is feeling back to his old self. When asked about his meds, the patient confirmed he had returned to taking the medication daily and the voices in his head have stopped. The patient met briefly with the medical director during this half of group.  In the psycho-ed, the patient was able to identify many things he does for fun, including good food, expressing affection, hugs, exercise and animals. The patient stated that some of the things he would like to commit to include: getting back to the gym consistently and re-joining the [employer]. He also includes reading healthy inspiring things before bedtime. The patient was engaged and present in a manner and depth that has been absent in the past two weeks. He responded well to this intervention.   UDS collected: No Results:   AA/NA attended?: YesThursday, Friday and Saturday  Sponsor?: No   Brandon Melnick, LCAS 04/12/2017 8:24 AM

## 2017-04-13 ENCOUNTER — Other Ambulatory Visit (HOSPITAL_COMMUNITY): Payer: 59 | Admitting: Psychology

## 2017-04-13 DIAGNOSIS — F15251 Other stimulant dependence with stimulant-induced psychotic disorder with hallucinations: Secondary | ICD-10-CM | POA: Diagnosis not present

## 2017-04-13 DIAGNOSIS — F1520 Other stimulant dependence, uncomplicated: Secondary | ICD-10-CM

## 2017-04-14 ENCOUNTER — Encounter (HOSPITAL_COMMUNITY): Payer: Self-pay | Admitting: Psychology

## 2017-04-14 NOTE — Progress Notes (Signed)
    Daily Group Progress Note  Program: CD-IOP   04/14/2017 John Robbins [id number]  Diagnosis:  Methamphetamine use disorder, severe ([place])  Generalized anxiety disorder  Chronic post-traumatic stress disorder (PTSD)   Sobriety Date: 12/13  Group Time: 1-2:30  Participation Level: Active  Behavioral Response: Appropriate and Sharing  Type of Therapy: Process Group  Interventions: CBT and Strength-based  Topic: Pts were active and engaged in process session in which pts shared about their weeks and utilization of coping skills. Pts were directed to discuss their tx goals. Pts shared about topics of recovery, 12 steps, and sobriety. COunselor led a 10 min mindful breathing script which helped pts focus on their breath and process the experience.      Group Time: 2:30-4  Participation Level: Active  Behavioral Response: Appropriate and Sharing  Type of Therapy: Psycho-education Group  Interventions: CBT and Supportive  Topic: Pts were active and engaged in psychoeducation session in which counselor led pts through a handout goal setting, SMART goals, and creating action towards reaching goals.     Summary: Pt was active and engaged in group. He continued to report he feels positive and hopeful and "very different than he did a week ago". PT reported on his Christmas break and seeing his brother for the first time in 6 mo, giving him a hug, and saying "I love you" which was very significant for him. Pts goals include going to gym at least 5 days per week, gaining 5lbs in 38mo Pt reported the psychoeducation helped him make his goals more specific and helpful.   UDS collected: No Results: negative  AA/NA attended?: YesThursday  Sponsor?: No   Wes Swan, LPCA LCASA 04/14/2017 1:24 PM

## 2017-04-16 ENCOUNTER — Encounter (HOSPITAL_COMMUNITY): Payer: Self-pay | Admitting: Psychology

## 2017-04-16 NOTE — Progress Notes (Signed)
    Daily Group Progress Note  Program: CD-IOP   04/16/2017 John Robbins [id number]  Diagnosis:  No diagnosis found.   Sobriety Date: 03/23/17  Group Time: 1-2:30pm  Participation Level: Active  Behavioral Response: Appropriate and Sharing  Type of Therapy: Process Group  Interventions: Supportive  Topic: Process: the first half of group was spent in process. Members shared about any issues or concerns that had presented themselves since we met yesterday afternoon. One member shared about her frustrations and difficulties with her 66 yo daughter and the problems with family members working in the business. Another member discussed the enmeshed relationship he has with his mother. He pointed out how some of her unhealthy patterns and behaviors were adopted by him at an early age and he has carried them into adulthood. Those patterns have been most obvious in his relationships. Three group members were absent today and while two were out on medical leave, the third group member did not call to explain her absence.   Group Time: 2:30-4pm  Participation Level: Minimal  Behavioral Response: Appropriate  Type of Therapy: Psycho-education  Interventions: Supportive  Topic: Psycho-Education: Agricultural consultant. A former group member with over a year of sobriety came to visit the group and share about her life. While she briefly described her life in active addiction, most of her talk dealt with the things she has embraced and behaviors she has practiced in recovery. She noted she was sharing the ugly dishonest and embarrassing things that she had done in her active addiction to keep herself reminded of those things, but to let others know they are not alone and need not be ashamed. 'There is no judgement', she reminded them. The speaker told a wonderful story of her life and the group responded favorably to her honesty, gratitude and acceptance.    Summary: The patient reported he  had a busy 'communication day'. He explained that he had spoken with his mother numerous times yesterday. His mother is an Firefighter 'for everyone' and she always has lots of drama and problems going on. The patient laughed and noted he has had to keep his family at a distance in order to avoid being pulled into the drama and chaos. He wondered whether that was one reason he became a long-distance runner, with lots of quiet time. The patient provided feedback to his fellow group member that displayed good insight. He also validated his fellow group members' feeling of being discouraged from accepting the recent job offer that will result in her discontinuing the program and being discharged. In the psycho-ed, with the visiting speaker, the patient was attentive and asked some good questions at the conclusion of her talk. He responded well to this intervention.    UDS collected: No  AA/NA attended?: No  Sponsor?: No   Brandon Melnick, LCAS 04/16/2017 2:18 PM

## 2017-04-17 ENCOUNTER — Encounter (HOSPITAL_COMMUNITY): Payer: Self-pay | Admitting: Licensed Clinical Social Worker

## 2017-04-17 ENCOUNTER — Other Ambulatory Visit (INDEPENDENT_AMBULATORY_CARE_PROVIDER_SITE_OTHER): Payer: 59 | Admitting: Psychology

## 2017-04-17 ENCOUNTER — Encounter (HOSPITAL_COMMUNITY): Payer: Self-pay | Admitting: Medical

## 2017-04-17 DIAGNOSIS — F19951 Other psychoactive substance use, unspecified with psychoactive substance-induced psychotic disorder with hallucinations: Secondary | ICD-10-CM

## 2017-04-17 DIAGNOSIS — R440 Auditory hallucinations: Secondary | ICD-10-CM

## 2017-04-17 DIAGNOSIS — F15251 Other stimulant dependence with stimulant-induced psychotic disorder with hallucinations: Secondary | ICD-10-CM | POA: Diagnosis not present

## 2017-04-17 DIAGNOSIS — F1520 Other stimulant dependence, uncomplicated: Secondary | ICD-10-CM

## 2017-04-17 DIAGNOSIS — F411 Generalized anxiety disorder: Secondary | ICD-10-CM

## 2017-04-17 DIAGNOSIS — F4312 Post-traumatic stress disorder, chronic: Secondary | ICD-10-CM

## 2017-04-17 DIAGNOSIS — F1910 Other psychoactive substance abuse, uncomplicated: Secondary | ICD-10-CM

## 2017-04-17 MED ORDER — FLUPHENAZINE HCL 10 MG PO TABS: ORAL_TABLET | ORAL | 1 refills | Status: DC

## 2017-04-17 NOTE — Progress Notes (Signed)
[facility]  Progress Note      Subjective:'I had a bad weekend.The voices were worse"  Chief Complaint:Methamphetamine addiction with substance induced psychosis   Visit Diagnosis: Methamphetamine dependence severe CPTSD Substance induced psychotic disorder with hallucinations(auditory) Hx of Polysubstance Abuse HIV  History of Present Illness: 1 week FU for SI Psychosis. Assessment  Methamphetamine induced psychotic disorder Methamphetamine addiction Polysubstance abuse with + UDS in treatment PTSD with chronic anxious depression  Plan:  Treatment Plan/Recommendations: Plan of Care: Resume CDIOP-pt informed he is NOT over his Meth addiction-Developmental Model of Recovery to be provided by Counselor Pt advised he may enter Psych CD IOP/Partial Hosp if he successfully does CDIOP  Laboratory:  UDS per protocol/Others with PCP/Specialist providers  Psychotherapy: CD IOP Individual;Group and Family  Medications: per Dr Daron Offer- Changed Prolixin to QHS from PRN and refilled  Routine PRN Medications:  No  Consultations: Not at this time  Safety Concerns: Relapse/Progressive psychosis  Other:  NA  Darlyne Russian, PA-C 04/12/2017, 1:55  TODAY pt reports voices were more prevelant and intense over weekend -not commanding -paranoid comments on others he would be engaged with mostly.Says he has taken perphenazine QHS.  Past Psychiatric History:From initial assessment 02/20/2017 1:00 PM  Diagnosis: May 2018 [facility] Schizoaffective Bipolar DO (pt abusing amphetamines);  Hospitalizations:  May/June 2018 [facility] Schizoaffective Bipolar DO (Pt did not report abusing amphetamine)  10/30-11/06/2016 [facility]                               . ADHD) [F90.9]Attention deficit hyperactivity disorder 02/08/2017  . PTSD (post-traumatic stress disorder) [F43.10] 02/08/2017  . Auditory hallucination [R44.0] 02/07/2017  . Schizoaffective disorder, bipolar type ([place]) [F25.0]  02/07/2017  . Alcohol use disorder, moderate, dependence ([place]) [F10.20] 02/07/2017  . Amphetamine use disorder, severe ([place]) [F15.20] 02/07/2017  . Psychosis [facility]     Outpatient Care:  Age [age] Dx Depressio and anxiety Rx Zoloft,Klonopin and Mellaril Stopped on own and FELT BETTER "I had feelings" Mid 20's SAD rx Paxil from GP severe reaction stopped after 1 month Age 49 Dx'd with Celiac disease-Gluten free diet stabilized mood/seizures stopped Age 3 Trauma anxiety Rx Klonopin by GP for 2 months 2017 Counseling/no meds Aug 2018 [facility]  Dr Daron Offer 1. Generalized anxiety disorder   2. Drug induced akathisia  FU Sept 2018 1. Psychosis, unspecified psychosis type ([place]) Latuda 80 mg  2. Generalized anxiety disorder   3. Drug induced akathisia [facility] IOP   4. Psychophysiological insomnia Trazodone 50 mg nightly  5. . 6. Brief psychotic disorder (HCC)Latuda 80 mg nightlyProlixin 5 mg is available as needed for break through voices ADHD continue Tenex 1 mg daily     Substance Abuse Care: None til now  Self-Mutilation: NA  Suicidal Attempts: NONE  Violent Behaviors: NONE   Review of Systems: Psychiatric: Agitation: Yes Hallucination: Yes Auditory-paranoid without command Depressed Mood: Yes Insomnia: Yes Hypersomnia: Negative Altered Concentration: Yes Feels Worthless: Yes  Voices reinforce negative feelings Grandiose Ideas: No Belief In Special Powers: No New/Increased Substance Abuse: No Compulsions: None to date since last use  Neurologic: Headache: Negative Seizure: Negative Paresthesias: Negative  Past Medical Family, Social History: From Initial Assessment:02/20/2017 1:00 PM   Past Medical History:      Past Medical History:  Diagnosis Date  . Allergy   . Anxiety   . Celiac disease   . History of substance abuse   .  HIV infection ([place])   . Raynaud disease   . Seizures ([place])          Past Surgical History:  Procedure Laterality Date   . WISDOM TOOTH EXTRACTION      Family Psychiatric History: Alcohol/drugs-PGF;Paternal Uncle;Brother                                                     Bipolar/ADD - Brother                                                     Father- describes as "Workaholic" Family History:       Family History  Problem Relation Age of Onset  . Heart disease Father 27  . Hypertension Father   . Heart disease Maternal Grandfather        early 56s  . Hypertension Maternal Grandfather   . Hypertension Mother   . Rheum arthritis Mother   . Crohn's disease Mother   . Crohn's disease Sister   . Rheum arthritis Maternal Grandmother   . Breast cancer Maternal Grandmother   . Colon cancer Paternal Grandmother   . Hypertension Paternal Grandfather   . Esophageal cancer Neg Hx   . Stomach cancer Neg Hx     Social History:   Social History        Socioeconomic History  . Marital status: Single has relationship    Spouse name: None  . Number of children: None  . Years of education: None  . Highest education level: Bachelor Degree in IT/Digital Media  Social Needs  . Financial resource strain: None  . Food insecurity - worry: None  . Food insecurity - inability None  . Transportation needs - medica None  . Transportation needs - non-medical: None  Mining engineer  . 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 Smoking status Never Smoker  . Smokeless tobacco: Never Used  Substance and Sexual Activity  . Alcohol use: Yes    Alcohol/week: 1.2 oz    Types: 2 Glasses of wine per week    Comment: Qocc  . Drug use: IV methamphetamine    Comment: Hx of maijuana, ecstacy, GHB, ketamine, amephetamines, cocaine - nothing since 2009  . Sexual activity: Yes    Partners: Male    Comment: Uses Condoms  Other Topics Concern  . Notifies partner of status  SociaQl History Narrative   Works in [employer], technical sales  Additional Social History:  BBryon LionsEnjoys  running;going to [employer]week  Allergies:       Allergies  Allergen Reactions  . Abilify [Aripiprazole] Other (See Comments)    Akathesia  . Gluten Meal Other (See Comments)    Neurological     Current Medications:       Current Outpatient Medications  Medication Sig Dispense Refill  . ABILIFY MAINTENA 300 MG SRER INJ 1 ML IM Q 30 DAYS  11  . fluconazole (DIFLUCAN) 200 MG tablet     . fluPHENAZine (PROLIXIN) 5 MG tablet TAKE 1 TABLET BY MOUTH AT BEDTIME AS NEEDED FOR HALLUCINATIONS 90 tablet 1  . guanFACINE (TENEX) 1 MG tablet TAKE 1 TABLET (1 MG  TOTAL) BY MOUTH 3 (THREE) TIMES DAILY.  1  . lurasidone (LATUDA) 80 MG TABS tablet Take 1 tablet (80 mg total) at bedtime by mouth. 90 tablet 1  . traZODone (DESYREL) 150 MG tablet Take 1 tablet (150 mg total) at bedtime by mouth. 90 tablet 1  . triamcinolone cream (KENALOG) 0.1 % APPLY TO AFFECTED AREA TWICE DAILY AS NEEDED  1  . TRIUMEQ 600-50-300 MG tablet TAKE 1 TABLET BY MOUTH ONCE EVERY DAY 90 tablet 2   No current facility-administered medications for this visit.      Physical Exam: Constitutional: Neat Anxious Mood: Dysphoric Affect: Congruent Thought: Logical Obsession with voices  Associations: intact Attention Span & Concentration Intact for visit Suicidal ideation No  Homicidal ideation No Fund of knowledge:Good Insight and judgment:Lacking/Impaired Language: Good Recent and remote memories:Traumatic Motor-WNL Akathasia;No  Assets:  Insurance;Housing;Support  ADL-Intact  Assessment;Substance induced Psychosis unresponsive to 5 mg Perphenazine and Latuda 80 mg  Plan: Check Insight            Increase Perphenazine to 10 mg HS and [place]or for side effects            FU Weds

## 2017-04-18 ENCOUNTER — Encounter (HOSPITAL_COMMUNITY): Payer: Self-pay | Admitting: Licensed Clinical Social Worker

## 2017-04-18 NOTE — Progress Notes (Signed)
  TREATMENT PLAN UPDATE SESSION THERAPIST PROGRESS NOTE  Session Time: 4-5  Participation Level: Active  Behavioral Response: Neat and Well GroomedAlertEuthymic  Type of Therapy: Individual Therapy  Treatment Goals addressed: Anxiety and Diagnosis: SUD  Interventions: CBT  Summary: John Robbins is a 42 y.o. male who presents for weekly individual therapy as part of CD-IOP to address his Methamphetamine Use Disorder and GAD. Pt continues to reports same sobriety date as previous session. He states he is feeling somewhat guarded but hopeful bc he had a difficult weekend but he was able to utilize some new tools and supports that he learned in CD-IOP. Pt and counselor discuss pt's tx plan and his goals of sobriety, social support, meditation, and yoga practice. Pt does not need revisions to goals and is proceeding well.   Counselor and pt discussed pt's lack of having fun or leisurely activities to do. Pt admitted he "does not know what to do or talk about other than be in his emotions w/ other people".  Pt was encouraged to find at least 1 activity he would be interested in pursing for the sake of fun.  Suicidal/Homicidal: Yeswithout intent/plan  Therapist Response: COunsleor used open questions and CBT to help pt critique his tx goals and improve his coping skills. Pt admits he continues to struggle w/ auditory hallucinations but "they are not bad right now" and he feels currently able to challenge them. Pt does report he is worried he may "do something to hurt himself" bc the voices convince him he is "a hopeless case". Counselor and pt discuss specific action plan were pt to experience thoughts of wanting to hurt himself including: call mother, call boyfriend, go to [place], go to gym.  Plan: Return again in 1 weeks.  Diagnosis:    ICD-10-CM   1. Methamphetamine use disorder, severe (HCC) F15.20   2. Generalized anxiety disorder F41.[place] Eaton Fol[place], LCAS-A 04/18/2017

## 2017-04-19 ENCOUNTER — Other Ambulatory Visit (HOSPITAL_COMMUNITY): Payer: 59 | Admitting: Psychology

## 2017-04-19 ENCOUNTER — Encounter (HOSPITAL_COMMUNITY): Payer: Self-pay | Admitting: Psychology

## 2017-04-19 DIAGNOSIS — F1520 Other stimulant dependence, uncomplicated: Secondary | ICD-10-CM

## 2017-04-19 DIAGNOSIS — F15251 Other stimulant dependence with stimulant-induced psychotic disorder with hallucinations: Secondary | ICD-10-CM | POA: Diagnosis not present

## 2017-04-19 DIAGNOSIS — F19951 Other psychoactive substance use, unspecified with psychoactive substance-induced psychotic disorder with hallucinations: Secondary | ICD-10-CM

## 2017-04-19 NOTE — Progress Notes (Signed)
Daily Group Progress Note  Program: CD-IOP   04/19/2017 John Robbins [id number]  Diagnosis:  Methamphetamine use disorder, severe ([place])  Chronic post-traumatic stress disorder (PTSD)  Substance-induced psychotic disorder with hallucinations ([place])  Verbal auditory hallucination  Polysubstance abuse ([place])   Sobriety Date: 12/13  Group Time: 1-2:30  Participation Level: Active  Behavioral Response: Appropriate, Sharing and Agitated  Type of Therapy: Process Group  Interventions: CBT and Supportive  Topic: Pts were active and engaged in process session in which pts shared about their weeks and utilization of coping skills. Pts were directed to discuss their tx goals. Pts shared about topics of recovery, 12 steps, and sobriety.       Group Time: 2:30-4  Participation Level: Active  Behavioral Response: Appropriate and Agitated  Type of Therapy: Psycho-education Group  Interventions: Other: Chair Yoga  Topic: Pts were active and engaged in psychoeducation session in which a guest counselor led a 1 hr therapeutic "chair yoga" session w/ instruction for how to implement practice into daily routines.    Summary: Pt was present in group though he admitted in the begging he had a "very difficult weekend". He appeared agitated throughout group process but was able to listen attentively. He reported his auditory hallucinations "voices in his head" were worse this weekend. He continues to take his medications as px and was able to use bx interventions such as calling group members, attending AA/NA/Support meetings, and meeting a person for coffee. Pt was happy to report he felt that the weekend was "challenging but also positive since he utilized new tools". Pt does endorse vague SI over the past few days since "the voices tell him he'd be better off hurting himself". He denies they are commanding him, denies intent, and verbalizes a strong safety plan including  calling his mother and boyfriend for help if the voices get overwhelming. Pt met w/ Medical Director to discuss weekend and get genetic swab testing initiated.    UDS collected: Yes Results: pending  AA/NA attended?: YesFriday and Saturday  Sponsor?: No  Wes Swan, LPCA LCASA 04/19/2017 11:11 AM

## 2017-04-20 ENCOUNTER — Other Ambulatory Visit (HOSPITAL_COMMUNITY): Payer: 59 | Admitting: Psychology

## 2017-04-20 DIAGNOSIS — F1520 Other stimulant dependence, uncomplicated: Secondary | ICD-10-CM

## 2017-04-20 DIAGNOSIS — F15251 Other stimulant dependence with stimulant-induced psychotic disorder with hallucinations: Secondary | ICD-10-CM | POA: Diagnosis not present

## 2017-04-21 ENCOUNTER — Encounter (HOSPITAL_COMMUNITY): Payer: Self-pay | Admitting: Psychology

## 2017-04-21 NOTE — Progress Notes (Signed)
    Daily Group Progress Note  Program: CD-IOP   04/21/2017 Joycelyn Rua [id number]  Diagnosis:  No diagnosis found.   Sobriety Date: 03/23/17  Group Time: 1-2:30pm  Participation Level: Active  Behavioral Response: Sharing  Type of Therapy: Process Group  Interventions: Supportive  Topic: Process: the first half of group was spent in process. Members shared about any challenges or issues they have experienced in early recovery. The program director met with two patients during group today. One member asked to leave early due to an upset stomach. She was excused. Drug test results were returned.   Group Time: 2:30-4pm  Participation Level: Active  Behavioral Response: Sharing  Type of Therapy: Psycho-education Group  Interventions: Family Systems  Topic: Psycho-Ed: Family Roles in Addicted/Dysfunctional Families; "Under the Influence". The second half of group was spent in a psycho-ed on Family Roles. A movie was presented called, "Under the Influence". It was the story of a family with an alcoholic father and all of the other roles played out by the family members. After viewing part of the film, group members discussed the roles they had identified for each member. This discussion brought up their own experiences and the roles they might have played in their family or origin.   Summary: The patient reported he was he had had a very stressful day yesterday. He came out of his apartment and could not find his car. It turned out his car had been repossessed. He had missed two payments, during his active addiction, and yesterday morning a tow truck had come and taken his car away. The patient reported he was 'very shaken', but instead of deteriorating further and possibly using, he was able to speak with the insurance company and has already worked out a Secretary/administrator. The patient reported he had gone to the 'Afternoon Delight' meeting on Tuesday and had found it calming. It  helped him gather himself. In the psycho-ed, the patient offered good insight about the various family roles in the film. When asked about his own biological family, the patient reported his major role was that of the 'hero'. Certainly more so than his siblings. He was good in school, was a major track star in high school and never got in any trouble. The patient provided helpful feedback and responded well to this intervention.    UDS collected: No Results:   AA/NA attended?: YesTuesday  Sponsor?: No, but he is actively seeking a sponsor   Brandon Melnick, [place] 04/21/2017 9:58 AM

## 2017-04-24 ENCOUNTER — Other Ambulatory Visit (HOSPITAL_COMMUNITY): Payer: 59 | Admitting: Psychology

## 2017-04-24 ENCOUNTER — Encounter (HOSPITAL_COMMUNITY): Payer: Self-pay | Admitting: Medical

## 2017-04-24 DIAGNOSIS — F4312 Post-traumatic stress disorder, chronic: Secondary | ICD-10-CM

## 2017-04-24 DIAGNOSIS — F19951 Other psychoactive substance use, unspecified with psychoactive substance-induced psychotic disorder with hallucinations: Secondary | ICD-10-CM

## 2017-04-24 DIAGNOSIS — F15251 Other stimulant dependence with stimulant-induced psychotic disorder with hallucinations: Secondary | ICD-10-CM | POA: Diagnosis not present

## 2017-04-24 DIAGNOSIS — R440 Auditory hallucinations: Secondary | ICD-10-CM

## 2017-04-24 DIAGNOSIS — F1520 Other stimulant dependence, uncomplicated: Secondary | ICD-10-CM

## 2017-04-24 DIAGNOSIS — G2571 Drug induced akathisia: Secondary | ICD-10-CM

## 2017-04-24 DIAGNOSIS — K900 Celiac disease: Secondary | ICD-10-CM

## 2017-04-24 DIAGNOSIS — I7300 Raynaud's syndrome without gangrene: Secondary | ICD-10-CM

## 2017-04-24 DIAGNOSIS — H9012 Conductive hearing loss, unilateral, left ear, with unrestricted hearing on the contralateral side: Secondary | ICD-10-CM

## 2017-04-25 ENCOUNTER — Encounter (HOSPITAL_COMMUNITY): Payer: Self-pay | Admitting: Psychology

## 2017-04-25 NOTE — Progress Notes (Signed)
    Daily Group Progress Note  Program: CD-IOP   04/25/2017 John Robbins [id number]  Diagnosis:     Sobriety Date: 03/23/17  Group Time: 1-2:30pm  Participation Level: Active  Behavioral Response: Appropriate  Type of Therapy: Process Group  Interventions: Supportive  Topic: Process/Graduation: the first half of group began with process and ended with a graduation ceremony. One member had to leave at the break so the group agreed to hold the ceremony before she had to leave. Members shared about any challenges or temptations they may have faced in early recovery. As the process neared an end, a graduation ceremony was held complete with brownies and the passing of the medallion. There were kind words of hope and encouragement shared with the graduating member.  Group Time: 2:30-4pm  Participation Level: Active  Behavioral Response: Appropriate  Type of Therapy: Psycho-Ed  Interventions: Strength-based  Topic: Psycho-ed: Popsicle Sticks; the second half of group was spent in a psycho-ed. As is typical on Thursdays, members drew from the group of popsicle sticks with each having a word or phrase written on it. The terms related to recovery and members were asked to share what they mean to them. There was a lively discussion with feedback and insight gleaned from the group. The session ended with good-byes to the graduating member and plans for the upcoming weekend.   Summary: The patient reported he had a 'good experience' last night. He explained that he had decided to go to a different meeting. "I am trying to avoid old patterns", he reported. He had met a gay couple who have both been in recovery for 10+ years. They went out to dinner afterwards and while they each have more than enough sponsees, they agreed to make some suggestions about sponsors for him. The patient was upbeat and appeared very pleased with these new-found friends. When asked about his friend in  [place], the patient reported he is in 'limbo' with him, explaining that the friend is frustrated that he has not been available as often as he would like. He shared kind words with the member leaving the program during her graduation. In the psycho-ed, the patient's word on the popsicle stick was 'Fear'. This was apropos, he explained. It has been a huge part of his life, feeling as if he is not good enough. Having an 'ultimate weakness' was how he put it.  The patient shared that he had tried to please his family, specifically his mother. Perhaps his underlying fear of inadequacy is tied, in part, to this effort towards perfection. The patient was candid and open about his struggles and received helpful feedback from his fellow group members. He responded well to this intervention.    UDS collected: No  AA/NA attended?: Yes, Wednesday evening  Sponsor?: No   Brandon Melnick, LCAS 04/25/2017 12:55 PM

## 2017-04-26 ENCOUNTER — Other Ambulatory Visit (HOSPITAL_COMMUNITY): Payer: 59 | Admitting: Psychology

## 2017-04-26 ENCOUNTER — Encounter: Payer: Self-pay | Admitting: Psychiatry

## 2017-04-26 DIAGNOSIS — F1520 Other stimulant dependence, uncomplicated: Secondary | ICD-10-CM

## 2017-04-26 DIAGNOSIS — F15251 Other stimulant dependence with stimulant-induced psychotic disorder with hallucinations: Secondary | ICD-10-CM | POA: Diagnosis not present

## 2017-04-27 ENCOUNTER — Encounter (HOSPITAL_COMMUNITY): Payer: Self-pay | Admitting: Licensed Clinical Social Worker

## 2017-04-27 ENCOUNTER — Other Ambulatory Visit (HOSPITAL_COMMUNITY): Payer: 59 | Admitting: Psychology

## 2017-04-27 DIAGNOSIS — F4312 Post-traumatic stress disorder, chronic: Secondary | ICD-10-CM

## 2017-04-27 DIAGNOSIS — F15251 Other stimulant dependence with stimulant-induced psychotic disorder with hallucinations: Secondary | ICD-10-CM | POA: Diagnosis not present

## 2017-04-27 DIAGNOSIS — F1520 Other stimulant dependence, uncomplicated: Secondary | ICD-10-CM

## 2017-04-28 ENCOUNTER — Encounter (HOSPITAL_COMMUNITY): Payer: Self-pay | Admitting: Psychology

## 2017-04-28 ENCOUNTER — Encounter (HOSPITAL_COMMUNITY): Payer: Self-pay | Admitting: Licensed Clinical Social Worker

## 2017-04-28 NOTE — Progress Notes (Signed)
CD-IOP INDIVIDUAL SESSION  THERAPIST PROGRESS NOTE  Session Time: 4-5  Participation Level: Active  Behavioral Response: Meticulous, Neat and Well GroomedAlertEuthymic and hopeful  Type of Therapy: Individual Therapy  Treatment Goals addressed: Diagnosis: SUD  Interventions: CBT, Strength-based and Supportive  Summary: John Robbins is a 42 y.o. male who presents for weekly ind session as part of CD-IOP. Pt is relaxed, active, and engaged in session. Counselor and pt spent time discussing pt's progress and tx goals. Pt continues to stay sober and attend meetings. He has secured a sponsor recently, and continues to practice mindfulness and yoga near daily. Pt reports he is journaling and reading recovery materials daily, while also starting sponsor-led step work.  Counselor and pt discussed pt's handling of his "down days w/ low energy" and how he used to believe something was "wrong w/ him that needed fixing". Pt now believes "down days" are something to be managed and that does not "require Meth" but rather compassion and self care.  Pt was asked about his desire for d/c and ongoing therapy needs. Pt requests to d/c in 2 weeks since he is "still getting his footing secure". Pt wants to find resources for ongoing individual therapy w/ an emphasis on Hypnosis for his past trauma.    Pt still endorses "voices" in his head but states they are "getting less severe and easier to dispute".   Suicidal/Homicidal: Nowithout intent/plan  Therapist Response: Counselor used CBT to assess pt's level of functioning, encourage pt in his tx goals, and discuss future plans for ongoing success in recovery.   Plan: Return again in 1 weeks.  Diagnosis:    ICD-10-CM   1. Methamphetamine use disorder, severe [facility]) F15.20       Archie Balboa, LCAS-A 04/28/2017

## 2017-04-28 NOTE — Progress Notes (Signed)
    Daily Group Progress Note  Program: CD-IOP   04/28/2017 Stanislaw Acton [id number]  Diagnosis:  No diagnosis found.   Sobriety Date: 03/23/17  Group Time: 1-2:30pm  Participation Level: Active  Behavioral Response: Appropriate and Sharing  Type of Therapy: Process Group  Interventions: Supportive  Topic: Process: the first half of group was spent in process. Members shared about the experiences they had had since we last met and any temptations or struggles in early recovery. They also identified any successes or 'shining moments' they had experienced. A new group member was present, and she introduced herself to the group. She received a warm welcome. Random drug tests were collected from three members. The program director met with one group member.  Group Time: 2:30-4pm  Participation Level: Active  Behavioral Response: Sharing  Type of Therapy: Psycho-education Group  Interventions: Family Systems  Topic: Psycho-Ed: Agricultural consultant; Chaplain. The second half of group was spent in a psycho-ed. A monthly visitor, Bruce Messenger, a chaplain with [facility], appeared as expected. He shared about himself and explained about families and our early experiences in shaping later behaviors and beliefs. Members were asked to share some of their more important memories of their family and what they might have come away with due to those early lessons.   Summary: The patient reported he had attended two meetings since we last met. he had attended one meeting with his new sponsor. It had been a 'step study', was a big meeting and he admitted it had been 'too intense'. The patient had attended a yoga session led by his sponsor's brother. It had been a good session and he had gotten in free thanks to his sponsor. He had felt really good after the session. In the psycho-ed, the patient identified himself as the middle child and his older sister was the Hero. At the same time, the  patient identified himself as working to please his mother and doing everything he could not to cause any friction or rock the boat. He worked hard to please his family, especially his mother. However, the patient reported he had come to realize, "I will never make her happy". He disclosed more of himself in session today and provided insightful feedback. The patient responded well to this intervention.   UDS collected: Yes Results: pending  AA/NA attended?: [facility] and Tuesday  Sponsor?: Yes   Brandon Melnick, LCAS 04/28/2017 8:10 AM

## 2017-04-28 NOTE — Progress Notes (Signed)
    Daily Group Progress Note  Program: CD-IOP   04/28/2017 John Robbins [id number]  Diagnosis:  Methamphetamine use disorder, severe ([place])  Substance-induced psychotic disorder with hallucinations ([place])  Verbal auditory hallucination  Chronic post-traumatic stress disorder (PTSD)  Conductive hearing loss of left ear with unrestricted hearing of right ear  Raynaud's disease without gangrene  Celiac disease  ABILIFY ALLERGY   Sobriety Date: 12/13  Group Time: 1-2:30  Participation Level: Active  Behavioral Response: Appropriate and Sharing  Type of Therapy: Process Group  Interventions: CBT and Strength-based  Topic: Pts were active and engaged in process session in which pts shared about their weeks and utilization of coping skills. Pts were directed to discuss their tx goals. Pts shared about topics of recovery, 12 steps, and sobriety.       Group Time: 2:30-4  Participation Level: Active  Behavioral Response: Appropriate and Sharing  Type of Therapy: Psycho-education Group  Interventions: CBT and Supportive  Topic: Pts were active and engaged in psychoeducation session on dysfunctional family systems, adult children of alcoholics symtpoms, and strategies for challenging "shoulds" that are incongruent w/ current values.    Summary: Pt was engaged and upbeat in group today and reported he secured a sponsor for the first time. He reported he has 30 days of sobriety and went to an NA meeting this weekend and picked up a chip. Pt was happy but nervous about his weekend since he "feels so good but he's waiting for shoe to drop". Pt was asked to elaborate and he stated that "he feels more secure and content w/ himself than he ever has". Pt described his current state as "having momentum" and realizing he "no longer has to put off taking care of himself". Pt shared openly during family discussion and talked about his role as "hero" which led to his  perfectionist, people pleasing bx.    UDS collected: Yes Results: Neg.  AA/NA attended?: YesSaturday and Sunday  Sponsor?: Yes   John Robbins 04/28/2017 9:28 AM

## 2017-04-29 ENCOUNTER — Encounter (HOSPITAL_COMMUNITY): Payer: Self-pay | Admitting: Psychology

## 2017-04-29 NOTE — Progress Notes (Signed)
    Daily Group Progress Note  Program: CD-IOP   04/29/2017 John Robbins [id number]  Diagnosis:   Sobriety Date: 03/23/17  Group Time: 1-2:30pm  Participation Level: Active  Behavioral Response: Sharing  Type of Therapy: Process Group  Interventions: Supportive  Topic: Patients were active and engaged in process session, in which patients shared about coping skills, challenges and reactions to the previous day's speaker.  Counselors facilitated group processing around recovery, sobriety, emotions, grief and relationships.  Group Time: 2:30-4pm  Participation Level: Active  Behavioral Response: Appropriate  Type of Therapy: Psycho-Ed  Interventions: Self-Care  Topic: : Patients were engaged in a 1 hr psychoeducation session in which guest speaker from [place], Frederich Balding, presented about healthy nutrition, sleep and exercise habits.  Summary: Patient was active and engaged in session.  Patient said that he planned to call his sponsor after group, as he was feeling low energy, which has been a trigger for pt in the past.  Patient said he is trying to get into the habit of "telling on himself," as he used to isolate himself when he was abusing substances.  Counselors affirmed patient's forthrightness.  Pt said his friend came over the previous evening and told pt he noticed positive changes in him and that patient "felt more comfortable in my own skin". In the psycho-ed, the patient shared about his eating habits and how he has to eat every few hours because his body is burning calories and he recognizes he must continue to provide the needed calories. The patient reminded the group that he is 'gluten-intolerant' so he is very careful about his diet. The patient made displayed good insight and responded well to this intervention.   UDS collected: No  AA/NA attended?: Yes, Wednesday evening  Sponsor?: Yes  Brandon Melnick, LCAS 04/29/2017 12:47 PM

## 2017-05-01 ENCOUNTER — Other Ambulatory Visit (HOSPITAL_COMMUNITY): Payer: 59 | Admitting: Psychology

## 2017-05-01 DIAGNOSIS — F411 Generalized anxiety disorder: Secondary | ICD-10-CM

## 2017-05-01 DIAGNOSIS — F1520 Other stimulant dependence, uncomplicated: Secondary | ICD-10-CM

## 2017-05-03 ENCOUNTER — Other Ambulatory Visit (INDEPENDENT_AMBULATORY_CARE_PROVIDER_SITE_OTHER): Payer: 59 | Admitting: Psychology

## 2017-05-03 ENCOUNTER — Encounter (HOSPITAL_COMMUNITY): Payer: Self-pay | Admitting: Medical

## 2017-05-03 DIAGNOSIS — R440 Auditory hallucinations: Secondary | ICD-10-CM

## 2017-05-03 DIAGNOSIS — F1910 Other psychoactive substance abuse, uncomplicated: Secondary | ICD-10-CM

## 2017-05-03 DIAGNOSIS — F1520 Other stimulant dependence, uncomplicated: Secondary | ICD-10-CM

## 2017-05-03 DIAGNOSIS — F19951 Other psychoactive substance use, unspecified with psychoactive substance-induced psychotic disorder with hallucinations: Secondary | ICD-10-CM

## 2017-05-03 DIAGNOSIS — F411 Generalized anxiety disorder: Secondary | ICD-10-CM

## 2017-05-03 DIAGNOSIS — F15251 Other stimulant dependence with stimulant-induced psychotic disorder with hallucinations: Secondary | ICD-10-CM | POA: Diagnosis not present

## 2017-05-03 DIAGNOSIS — F4312 Post-traumatic stress disorder, chronic: Secondary | ICD-10-CM

## 2017-05-03 DIAGNOSIS — I7300 Raynaud's syndrome without gangrene: Secondary | ICD-10-CM

## 2017-05-03 DIAGNOSIS — G2571 Drug induced akathisia: Secondary | ICD-10-CM

## 2017-05-03 DIAGNOSIS — K900 Celiac disease: Secondary | ICD-10-CM

## 2017-05-03 DIAGNOSIS — B20 Human immunodeficiency virus [HIV] disease: Secondary | ICD-10-CM

## 2017-05-03 NOTE — Progress Notes (Signed)
PT SEEN TO REVIEW GENESIGHT HAS ISSUES WITH [facility]UDA -REVIEWED -GIVEN INSERT AND CYP3A4 INHIBITOR AND INDUCER INFO.TEST DID NOT INCLUDE ANTIRETROVIRALS SO HE IS CALLING COMPANY TO GET ADD ON CURRENTLY NO PROBLEMS WITH MEDS

## 2017-05-04 ENCOUNTER — Other Ambulatory Visit (HOSPITAL_COMMUNITY): Payer: 59 | Admitting: Psychology

## 2017-05-04 DIAGNOSIS — F15251 Other stimulant dependence with stimulant-induced psychotic disorder with hallucinations: Secondary | ICD-10-CM | POA: Diagnosis not present

## 2017-05-04 DIAGNOSIS — F1520 Other stimulant dependence, uncomplicated: Secondary | ICD-10-CM

## 2017-05-04 DIAGNOSIS — F4312 Post-traumatic stress disorder, chronic: Secondary | ICD-10-CM

## 2017-05-05 ENCOUNTER — Encounter (HOSPITAL_COMMUNITY): Payer: Self-pay | Admitting: Licensed Clinical Social Worker

## 2017-05-05 DIAGNOSIS — F411 Generalized anxiety disorder: Secondary | ICD-10-CM

## 2017-05-05 DIAGNOSIS — F1520 Other stimulant dependence, uncomplicated: Secondary | ICD-10-CM

## 2017-05-06 NOTE — Progress Notes (Signed)
    Daily Group Progress Note  Program: CD-IOP   Group Time: 1-2:30 pm  Participation Level: Active  Behavioral Response: Sharing  Type of Therapy: Process Group  Topic: The first part of group was spent in process. Members identified any successes or challenges they had faced in early recovery. Included in their report were the number of recovery-based meetings they had attended since we last met. The medical director met with two group members during the session today. Four drug tests were collected.  Group Time: 2:30-4 pm  Participation Level: Active  Behavioral Response: Sharing  Type of Therapy: Psycho-education Group  Topic: Psycho-Ed: Codependency; Part 2. The second half of group consisted of a psycho-ed on codependency. Members were provided with a handout. The handout was read and discussed with members sharing about their patterns of behavior in relationships. It was an intense session with vulnerability displayed through members' disclosures.   Summary: The patient reported it had been an up and down few days since we last met. he shared that he had attended an NA meeting Monday night. It is not one he usually goes to, but he went to meet his sponsor there. it was a big meeting and they split into two groups. Someone shred about 'shooting up' and the patient was surprised that this disclosure proved to be a trigger. The patient reported he had a visceral response and besides being surprised, he was disappointed in himself. He was angry with himself and felt shame. The patient shared that he had gone to a practice session with the [employer], a group of gays and lesbians. He had been in the group a year ago but had dropped out due to his drug use. In the psycho-ed, the patient admitted he had been codependent with his partner of ten years and had 'abandoned myself'. The patient pointed out that he had learned 'how to appease people' from his family at a very young age.  The patient shared freely about his codependency through the years, but was able to articulate what he intends to work on to insure he is focusing on his own well-being and be able to be in a relationship of balance and equality. He responded well to this intervention. A drug test was collected from this patient   UDS collected: Yes Results: pending  AA/NA attended?: Yes, Monday evening  Sponsor?: Yes   Brandon Melnick, LCAS

## 2017-05-07 ENCOUNTER — Encounter (HOSPITAL_COMMUNITY): Payer: Self-pay | Admitting: Psychology

## 2017-05-07 NOTE — Progress Notes (Signed)
    Daily Group Progress Note  Program: CD-IOP   05/07/2017 John Robbins [id number]  Diagnosis:  Metahmphetamine use disorder, severe  Sobriety Date: 03/23/17  Group Time: 1-2:30pm  Participation Level: Active  Behavioral Response: Sharing  Type of Therapy: Process  Interventions: supportive  Topic: Patients were active and engaged in process session.  Patients shared reflections about the previous session's discussion on codependency and family roles.  Patients shared accomplishments and challenges with relationships and recovery.   Group Time: 2:30-4pm  Participation Level: Active  Behavioral Response: Appropriate  Type of Therapy: Psycho-Education  Interventions: Family Systems  Topic: Patients were active and engaged in psychoeducation session, in which counselors facilitated further discussion around codependency.  As a group, patients read "The Bridge," a brief story that presents an Warehouse manager of codependence.  They shared if and how they identified with the characters in the story and how they may implement new insights into their lives. It was a lively discussion with good refection and disclosure.  Summary: Pt was active and engaged in session.  He shared about a phone conversation he had with his father. His father was complaining and patient offered to help but father decided to continue complaining instead.  He explained that he recognizes that his father's communication can be manipulative. The patient agreed that he is very relieved that he chose not to go with him to [place] to take care of his aunt's funeral arrangements. The patient provided helpful insight into another way to look at "The Bridge," sharing that there may be parts of ourselves that we need to let go of. He shared that his mental health has been a struggle for him and that it is something he knows he will have to address in greater depth in the future. He made some excellent comments and  responded well to this intervention.   UDS collected: No  AA/NA attended?: No  Sponsor?: Yes   Brandon Melnick, LCAS 05/07/2017 11:18 AM

## 2017-05-08 ENCOUNTER — Encounter (HOSPITAL_COMMUNITY): Payer: Self-pay | Admitting: Psychology

## 2017-05-08 ENCOUNTER — Encounter (HOSPITAL_COMMUNITY): Payer: Self-pay | Admitting: Licensed Clinical Social Worker

## 2017-05-08 ENCOUNTER — Other Ambulatory Visit (HOSPITAL_COMMUNITY): Payer: 59 | Admitting: Psychology

## 2017-05-08 DIAGNOSIS — F1520 Other stimulant dependence, uncomplicated: Secondary | ICD-10-CM

## 2017-05-08 DIAGNOSIS — F15251 Other stimulant dependence with stimulant-induced psychotic disorder with hallucinations: Secondary | ICD-10-CM | POA: Diagnosis not present

## 2017-05-08 DIAGNOSIS — F411 Generalized anxiety disorder: Secondary | ICD-10-CM
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
## 2017-05-09 ENCOUNTER — Encounter (HOSPITAL_COMMUNITY): Payer: Self-pay | Admitting: Licensed Clinical Social Worker

## 2017-05-09 ENCOUNTER — Encounter (HOSPITAL_COMMUNITY): Payer: Self-pay | Admitting: Psychology

## 2017-05-09 NOTE — Progress Notes (Signed)
CD-IOP INDIVIDUAL COUNSELING NOTE   Termination Session  THERAPIST PROGRESS NOTE  Session Time: 4-5  Participation Level: Active  Behavioral Response: Meticulous and NeatAlertAnxious and Euthymic  Type of Therapy: Individual Therapy  Treatment Goals addressed: Anxiety and Diagnosis: SUD  Interventions: CBT and Strength-based  Summary: John Robbins is a 42 y.o. male who presents for final ind counseling session as part of CD-IOP. Pt plans to d/c from group this Thursday. Pt and counselor discuss pt's progress in group. Pt has utilized several skills including deep breathing, reframing negative cognitions, and challenging negative self talk. Pt remains agitated and nervous about his upcoming work. Pt continues to endorse delusional thoughts such as "his work put up cameras in his house and know all the bad things he has done", though he admits these are delusional. Pt will continue in the fellowship of 12 Steps, [place] [facility], Aftercare group at this clinic on Wednesday nights at 5:30 for at least 4 weeks. Pt will begin meeting w/ Dr. Daron Offer again and discuss ongoing psychiatric issues and medication monitoring.   Suicidal/Homicidal: Nowithout intent/plan  Therapist Response: Pt appears more stable, more insightful, and more proactive about his own recovery. Pt states he realizes his anxiety and drug addiction are rooted in his "deeply codependent" relationships w/ family members and past boyfriends. Pt reports he feels "like a hypocrite and is working on being kinder to himself, as he would be to someone else".   Plan: Return again in 1 weeks.  Diagnosis:    ICD-10-CM   1. Methamphetamine use disorder, severe (HCC) F15.20   2. Generalized anxiety disorder F41.Dwight Alayzia Pavlock, LCAS-A 05/09/2017

## 2017-05-09 NOTE — Progress Notes (Signed)
Problem # 1. Client is unable to remain clean and sober from mind-altering substances, including alcohol.    Patient Will: o Establish and maintain abstinence o Acquire the necessary skills to maintain long-term sobriety Increase knowledge of addiction and the recovery process  Therapist Will: o Direct group therapy that facilitates the sharing of, causes for, consequences of, feelings about, and alternatives to substance use. o Write a personal recovery plan that includes regular attendance at support meetings, aftercare, and/or individual or family therapy. Help the client understand the familial, emotional and social factors that contribute to the development of chemical dependence.  Problem # 2 Lacks sufficient support for on-going abstinence  Patient Will: o Patient will remain clean and sober from all mind-altering chemicals.   o Attend AA meetings a minimum of 4 times per week. Obtain a sponsor and home group in [place] Will: o Stress the importance of an active 12 step program and teach why and how this program can work for the pt. as an adjunct to therapy and after-care. o Provide pt. with resources including reading materials from [employer] and lists of area meetings. Group processing of members experience with AA and other 12 step programs.

## 2017-05-09 NOTE — Progress Notes (Signed)
Pt arrived, unshceduled, requesting "to see someone". Pt was met by this Probation officer in the lobby and brought back for individual counseling. Pt stated he was "feeling overwhelmed by his anxiety and needed to talk to someone about it". Pt denied SI or HI. Pt stated he has been feeling as though his dog may die and he is getting very nervous about it. Pt wanted to discuss his previous night in which he went to his boyfriend's house for support. Total time spent w/ pt: 73mns.

## 2017-05-09 NOTE — Progress Notes (Signed)
    Daily Group Progress Note  Program: CD-IOP   05/09/2017 John Robbins [id number]  Diagnosis:  Methamphetamine use disorder, severe ([place])  Generalized anxiety disorder   Sobriety Date: 12/13  Group Time: 1-2:30  Participation Level: Active  Behavioral Response: Appropriate and Sharing  Type of Therapy: Process Group  Interventions: CBT and Supportive  Topic: Pts were active and engaged in process session in which pts shared about their weeks and utilization of coping skills. Pts were directed to discuss their tx goals. Pts shared about topics of recovery, 12 steps, and sobriety.       Group Time: 2:30-4  Participation Level: Active  Behavioral Response: Appropriate and Sharing  Type of Therapy: Psycho-education Group  Interventions: CBT and Meditation: Guided Imagery  Topic: Pts were active and engaged in psychoeducation session utilizing mindfulness through a guided imagery. Pts heard from 2 guest students who were in attendance that day.    Summary: Pt was active and engaged in group. He reported he attended 3 AA/Recovery mtgs since last group. He reported on his "anxious-ridden weekend" and how he was able to progress through his anxiety w/ help from skills he is learning in this group. Pt was able to identify cognitive distortions that his dog would "be murdered" if he was to leave house as an extreme case. Pt reported he continues to share w/ his sponsor, his boyfriend, and his mother about his ongoing challenges to staying sober. Pt said this past weekend was "the most he has felt like using meth". Pt reports even though he is anxious, he feels that tx is helping him cope and he is looking forward to doing EMDR in individual therapy w/ Lina Sayre when he d/c from CD-IOP.   UDS collected: No Results: negative  AA/NA attended?: YesSaturday and Sunday  Sponsor?: Yes   [name], LPCA LCASA 05/09/2017 4:38 PM

## 2017-05-10 ENCOUNTER — Other Ambulatory Visit (INDEPENDENT_AMBULATORY_CARE_PROVIDER_SITE_OTHER): Payer: 59 | Admitting: Psychology

## 2017-05-10 ENCOUNTER — Encounter (HOSPITAL_COMMUNITY): Payer: Self-pay | Admitting: Medical

## 2017-05-10 DIAGNOSIS — B20 Human immunodeficiency virus [HIV] disease: Secondary | ICD-10-CM

## 2017-05-10 DIAGNOSIS — F15251 Other stimulant dependence with stimulant-induced psychotic disorder with hallucinations: Secondary | ICD-10-CM | POA: Diagnosis not present

## 2017-05-10 DIAGNOSIS — I7300 Raynaud's syndrome without gangrene: Secondary | ICD-10-CM

## 2017-05-10 DIAGNOSIS — R440 Auditory hallucinations: Secondary | ICD-10-CM

## 2017-05-10 DIAGNOSIS — F411 Generalized anxiety disorder: Secondary | ICD-10-CM

## 2017-05-10 DIAGNOSIS — F1520 Other stimulant dependence, uncomplicated: Secondary | ICD-10-CM

## 2017-05-10 DIAGNOSIS — F418 Other specified anxiety disorders: Secondary | ICD-10-CM

## 2017-05-10 DIAGNOSIS — F5104 Psychophysiologic insomnia: Secondary | ICD-10-CM

## 2017-05-10 DIAGNOSIS — F4312 Post-traumatic stress disorder, chronic: Secondary | ICD-10-CM

## 2017-05-10 DIAGNOSIS — K900 Celiac disease: Secondary | ICD-10-CM

## 2017-05-10 DIAGNOSIS — F19951 Other psychoactive substance use, unspecified with psychoactive substance-induced psychotic disorder with hallucinations: Secondary | ICD-10-CM

## 2017-05-10 DIAGNOSIS — F1910 Other psychoactive substance abuse, uncomplicated: Secondary | ICD-10-CM

## 2017-05-10 MED ORDER — LURASIDONE HCL 80 MG PO TABS: 80.0000 mg | ORAL_TABLET | Freq: Every day | ORAL | 1 refills | Status: DC

## 2017-05-10 MED ORDER — FLUPHENAZINE HCL 10 MG PO TABS: ORAL_TABLET | ORAL | 1 refills | Status: DC

## 2017-05-10 MED ORDER — TRAZODONE HCL 150 MG PO TABS: 150.0000 mg | ORAL_TABLET | Freq: Every day | ORAL | 1 refills | Status: DC

## 2017-05-10 NOTE — Progress Notes (Signed)
[facility]charge Summary   John Robbins [id number]  Date of Admission: 02/16/2017 Date of Discharge: 05/11/2017  Admission Diagnosis: CM     1. Amphetamine and psychostimulant dependence, abuse ([place]) F15.20   2. Methamphetamine use disorder, severe ([place]) F15.20   3. Human immunodeficiency virus (HIV) disease ([place]) B20   4. Chronic post-traumatic stress disorder (PTSD) F43.12   5. Verbal auditory hallucination R44.0   6. Psychosis, unspecified psychosis type ([place]) F29   7. Drug induced akathisia G25.71    Abilify-subsequent encounter  8. Psychophysiological insomnia F51.04   9. Conductive hearing loss of left ear with unrestricted hearing of right ear H90.12   10. Raynaud's disease without gangrene I73.00   11. Celiac disease K90.0     Discharge Diagnosis: 0 Amphetamine and psychostimulant dependence, abuse ([place]bins)  0 Methamphetamine use disorder, severe ([place]bins)  0 Substance-induced psychotic disorder with hallucinations ([place])  0 Verbal auditory hallucination  0 Polysubstance abuse ([place])  0 Chronic post-traumatic stress disorder (PTSD)  0 Generalized anxiety disorder  0 Psychophysiological insomnia  0 Anxious depression  0 Human immunodeficiency virus (HIV) disease ([place])  0 Celiac disease  0 Raynaud's disease without gangrene    Course of Treatment: Patient began IOP therapy 02/16/2017 on referral from his Mead MD( who initially consulted on him as an outpatient on  referral from Dr John Schlatter MD Resident in Internal medicine on 08/26/2016 after pt c/o auditory hallucinations in addition to disabling anxiety with anhedonia) with Group therapy which continued on scheduled Mon,Weds.,Thursday 1-4pm thruout his stay in IOP. Intermittently he met individually with his Primary counselor John Robbins LPC.His case was reviewed every Wednesday morning by John Director and IOP  Counselors/ staff.  Initially pt presented appropriately but on 11/21 he came to group somewhat dissheveled andsweating and admitting return to IV Meth use.He continued however without further difficulty until 03/22/2017 Group when he arrive appearing distracted and distressed.and reported hearing voices again and thinking about using to quiet them.On 03/23/2017 pt arrived at group dissheveled and distracteed. When approack hed about his + UDS he became defensive and left group after 5 minutes. He was gone for 2 weeks without contact despite numerous attempts by Counselors to contact him.  Eventually he was discovered at his Mother's house in [place] and returned to Group on 04/06/2017 after Team meeting discussed his abscences and motivation.Team deemed it appropriate for him to continue with Behavioral Zero Tolerance contract which he agreed to.He met with John Director 04/10/2017. Pt had met on 12/28 individually with Counselor expressing his idea that he would not ever return to use and that he needed a Partial Hospitalization type program (5 days/week supervised care). John Director pointed out that it was very unlikely he was "over" his amphetamine addiction. Pt stated he didnt feel he could drive daily to Partial program at [place].  PT THEN REVEALED HE WAS NOT TAKING HIS PRN ANTIPSYCHOTIC PERPHENAZINE  PRESCRIBED BY DR John Robbins SPECIFICALLY FOR HIS VOICES.THE DEVELOPMENTAL MODEL (63YR RELAPSE PREVENTION) OF RECOVERY WAS REVIEWED WITH PATIENT. Pt agreed to take his medication and to complete the CD IOP after which he could enter the Psych IOP or a Partial Hospital Program if he chose.Director met with him 1 week later and increased Perphenazine to 10 mg HS from 5 mg and this was sufficient to manage the psychosis. The other voices in his head he was advised are his internalization of past voices he needs to learn  to deal with in the context of his PTSD and recovery .He was also advised he  will be referred to EMDR therapy at Discharge for his PTSD.  Pt was able to successfully complete CDIOP on 05/11/2017.   Medications: fluconazole 200 MG tablet  Commonly known as: DIFLUCAN    fluPHENAZine 10 MG tablet  Commonly known as: PROLIXIN  TAKE 1 TABLET BY MOUTH AT BEDTIME AS NEEDED FOR HALLUCINATIONS   lurasidone 80 MG Tabs tablet  Commonly known as: LATUDA  Take 1 tablet (80 mg total) by mouth at bedtime.   traZODone 150 MG tablet  Commonly known as: DESYREL  Take 1 tablet (150 mg total) by mouth at bedtime.   triamcinolone cream 0.1 %  Commonly known as: KENALOG  APPLY TO AFFECTED AREA TWICE DAILY AS NEEDED   TRIUMEQ 600-50-300 MG tablet  Generic drug: abacavir-dolutegravir-lamiVUDine  TAKE 1 TABLET BY MOUTH ONCE EVERY DAY      Goals and Activities to Help Maintain Sobriety: 1. Stay away from people,places and things that are triggers to use 2. Continue practicing Fair Fighting rules in interpersonal conflicts. 3. Continue alcohol and drug refusal skills and call on support systems. 4.   TAKE MEDS AS Prescribed  Referrals: John [facility] for EMDR   Aftercare services:John [place] 5:30-6:30 Weds 1. Attend AA/NA meetings as many times as you used  per week. 2. Continue with a sponsor and a home group in [place]. 3. Return to Psychiatrist Dr John Robbins 05/25/2017 4. Keep appointments with PCP and Specialists  Next appointment: Aftercare  Prognosis Fair    Client has participated in the development of this discharge plan and has received a copy of this completed plan  John Robbins  05/10/2017   John Russian, PA-C 05/10/2017

## 2017-05-11 ENCOUNTER — Other Ambulatory Visit (HOSPITAL_COMMUNITY): Payer: 59 | Admitting: Psychology

## 2017-05-11 DIAGNOSIS — F1520 Other stimulant dependence, uncomplicated: Secondary | ICD-10-CM

## 2017-05-11 DIAGNOSIS — F19951 Other psychoactive substance use, unspecified with psychoactive substance-induced psychotic disorder with hallucinations: Secondary | ICD-10-CM

## 2017-05-11 DIAGNOSIS — F15251 Other stimulant dependence with stimulant-induced psychotic disorder with hallucinations: Secondary | ICD-10-CM | POA: Diagnosis not present

## 2017-05-11 DIAGNOSIS — R440 Auditory hallucinations: Secondary | ICD-10-CM

## 2017-05-13 ENCOUNTER — Encounter (HOSPITAL_COMMUNITY): Payer: Self-pay | Admitting: Medical

## 2017-05-14 ENCOUNTER — Encounter (HOSPITAL_COMMUNITY): Payer: Self-pay | Admitting: Psychology
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
## 2017-05-14 NOTE — Progress Notes (Signed)
    Daily Group Progress Note  Program: CD-IOP   05/14/2017 Ruffus Kamaka [id number]  Diagnosis:  Methamphetamine use disorder, severe  Sobriety Date: 03/23/17  Group Time: 1-2:30pm  Participation Level: Active  Behavioral Response: Appropriate  Type of Therapy: Process Group  Interventions: Supportive  Topic: Patients were active and engaged in process session.  Patients shared reflections about the previous session's discussion of boundaries.  Patients shared accomplishments and challenges with relationships and recovery.  Patients raised questions and shared experiences about sobriety, 12-Step Meetings and sponsors.  Counselors collected two UDS.  Group Time: 2:30-4pm  Participation Level: Active  Behavioral Response: Sharing  Type of Therapy: Psycho-education Group  Interventions: Nurse, adult: Boundaries; Part 3/Graduation. Patients were active and engaged in psychoeducation session, in which counselors continued to facilitate discussion around the previous session's topic of boundaries.  Patients shared their "homework" assignment of identifying someone in their life who they struggle to set boundaries with and in what areas boundaries are rigid, porous or healthy.  The group also celebrated the graduation of a member who completed the program today.  Summary: Patient was active and engaged in his last day of CD-IOP.  Patient responded well to the boundaries activities and reflected on how he has grown throughout the program in setting healthier boundaries.  Patient expressed intent to continue working on developing healthy boundaries with his coworkers.  Patient encouraged and affirmed other group members.  Patient shared that his mother started reading "Codependent No More" without being prompted, which was encouraging to him.  Counselors and group members shared their gratitude for and affirmation of patient upon his graduation.  Patient thanked  the group and expressed hope about the future of his recovery and life. The patient was gracious during his graduation ceremony and leaves [place] with all of the tools he will need in order to remain drug-free going forward.   UDS collected: No   AA/NA attended?: YesThursday  Sponsor?: Yes   Brandon Melnick, [place] 05/14/2017 2:02 PM

## 2017-05-15 ENCOUNTER — Other Ambulatory Visit (HOSPITAL_COMMUNITY): Payer: 59

## 2017-05-16 ENCOUNTER — Telehealth (HOSPITAL_COMMUNITY): Payer: Self-pay | Admitting: Licensed Clinical Social Worker

## 2017-05-16 NOTE — Telephone Encounter (Signed)
Called to remind pt that he will be starting Aftercare group tomorrow night at 5:30pm. Pt verbalized understanding.

## 2017-05-17 ENCOUNTER — Ambulatory Visit (INDEPENDENT_AMBULATORY_CARE_PROVIDER_SITE_OTHER): Payer: 59 | Admitting: Licensed Clinical Social Worker

## 2017-05-17 ENCOUNTER — Other Ambulatory Visit (HOSPITAL_COMMUNITY): Payer: 59

## 2017-05-17 DIAGNOSIS — F1520 Other stimulant dependence, uncomplicated: Secondary | ICD-10-CM | POA: Diagnosis not present

## 2017-05-17 DIAGNOSIS — F411 Generalized anxiety disorder: Secondary | ICD-10-CM | POA: Diagnosis not present

## 2017-05-17 DIAGNOSIS — F4312 Post-traumatic stress disorder, chronic: Secondary | ICD-10-CM | POA: Diagnosis not present

## 2017-05-18 ENCOUNTER — Other Ambulatory Visit (HOSPITAL_COMMUNITY): Payer: 59

## 2017-05-19 ENCOUNTER — Emergency Department (HOSPITAL_COMMUNITY)
Admission: EM | Admit: 2017-05-19 | Discharge: 2017-05-21 | Disposition: A | Discharge status: Other Acute Inpatient Hospital | Payer: 59 | Attending: Emergency Medicine | Admitting: Emergency Medicine

## 2017-05-19 ENCOUNTER — Encounter (HOSPITAL_COMMUNITY): Payer: Self-pay | Admitting: Licensed Clinical Social Worker

## 2017-05-19 ENCOUNTER — Encounter (HOSPITAL_COMMUNITY): Payer: Self-pay | Admitting: *Deleted

## 2017-05-19 DIAGNOSIS — F19959 Other psychoactive substance use, unspecified with psychoactive substance-induced psychotic disorder, unspecified: Secondary | ICD-10-CM | POA: Diagnosis present

## 2017-05-19 DIAGNOSIS — R45851 Suicidal ideations: Secondary | ICD-10-CM | POA: Diagnosis not present

## 2017-05-19 DIAGNOSIS — T782XXA Anaphylactic shock, unspecified, initial encounter: Secondary | ICD-10-CM

## 2017-05-19 DIAGNOSIS — F1910 Other psychoactive substance abuse, uncomplicated: Secondary | ICD-10-CM | POA: Insufficient documentation

## 2017-05-19 DIAGNOSIS — F250 Schizoaffective disorder, bipolar type: Secondary | ICD-10-CM | POA: Insufficient documentation

## 2017-05-19 DIAGNOSIS — Z79899 Other long term (current) drug therapy: Secondary | ICD-10-CM | POA: Diagnosis not present

## 2017-05-19 DIAGNOSIS — F4310 Post-traumatic stress disorder, unspecified: Secondary | ICD-10-CM | POA: Diagnosis not present

## 2017-05-19 DIAGNOSIS — F901 Attention-deficit hyperactivity disorder, predominantly hyperactive type: Secondary | ICD-10-CM | POA: Insufficient documentation

## 2017-05-19 DIAGNOSIS — F1510 Other stimulant abuse, uncomplicated: Secondary | ICD-10-CM

## 2017-05-19 DIAGNOSIS — F1520 Other stimulant dependence, uncomplicated: Secondary | ICD-10-CM | POA: Diagnosis present

## 2017-05-19 DIAGNOSIS — Z21 Asymptomatic human immunodeficiency virus [HIV] infection status: Secondary | ICD-10-CM | POA: Diagnosis not present

## 2017-05-19 LAB — COMPREHENSIVE METABOLIC PANEL
ALK PHOS: 92 U/L (ref 38–126)
ALT: 115 U/L — ABNORMAL HIGH (ref 17–63)
ANION GAP: 15 (ref 5–15)
AST: 52 U/L — ABNORMAL HIGH (ref 15–41)
Albumin: 4.8 g/dL (ref 3.5–5.0)
BILIRUBIN TOTAL: 0.8 mg/dL (ref 0.3–1.2)
BUN: 14 mg/dL (ref 6–20)
CO2: 25 mmol/L (ref 22–32)
Calcium: 9.2 mg/dL (ref 8.9–10.3)
Chloride: 95 mmol/L — ABNORMAL LOW (ref 101–111)
Creatinine, Ser: 1.06 mg/dL (ref 0.61–1.24)
GFR calc non Af Amer: >60 mL/min (ref >60)
Glucose, Bld: 133 mg/dL — ABNORMAL HIGH (ref 65–99)
POTASSIUM: 3.5 mmol/L (ref 3.5–5.1)
Sodium: 135 mmol/L (ref 135–145)
TOTAL PROTEIN: 8.1 g/dL (ref 6.5–8.1)

## 2017-05-19 LAB — RAPID URINE DRUG SCREEN, HOSP PERFORMED
Amphetamines: POSITIVE — AB
BARBITURATES: NOT DETECTED
BENZODIAZEPINES: NOT DETECTED
Cocaine: NOT DETECTED
Opiates: NOT DETECTED
Tetrahydrocannabinol: NOT DETECTED

## 2017-05-19 LAB — SALICYLATE LEVEL

## 2017-05-19 LAB — CBC
HCT: 47.2 % (ref 39.0–52.0)
Hemoglobin: 17.1 g/dL — ABNORMAL HIGH (ref 13.0–17.0)
MCH: 31.4 pg (ref 26.0–34.0)
MCHC: 36.2 g/dL — ABNORMAL HIGH (ref 30.0–36.0)
MCV: 86.8 fL (ref 78.0–100.0)
Platelets: 209 10*3/uL (ref 150–400)
RBC: 5.44 MIL/uL (ref 4.22–5.81)
RDW: 12.4 % (ref 11.5–15.5)
WBC: 9.5 10*3/uL (ref 4.0–10.5)

## 2017-05-19 LAB — ACETAMINOPHEN LEVEL

## 2017-05-19 LAB — ETHANOL: Alcohol, Ethyl (B): <10 mg/dL (ref <10)

## 2017-05-19 NOTE — ED Notes (Signed)
Patient currently denies SI/HI/VH. Patient however reports VH but refuse to elaborate on them at this time. Plan of care discussed. Encouragement and support provided and safety maintain. Q 16 min safety checks in place and video monitoring.

## 2017-05-19 NOTE — ED Provider Notes (Signed)
[facility] DEPT Provider Note   CSN: [id number] Arrival date & time: 05/19/17  2200     History   Chief Complaint No chief complaint on file.   HPI John Robbins is a 42 y.o. male.  The history is provided by the patient and medical records.    42 year old male with past medical history significant for anxiety, allergies, celiac disease, polysubstance abuse, HIV, seizures, Reynaud's disease, presenting to the ED for psychiatric evaluation.  Patient states he has been addicted to crystal meth for several years now.  States he hears voices frequently that are telling him to do things like harm himself and to use drugs.  States he feels like there is a "implant" in his ear that is doing this.  States sometimes he does see things that are not there, typically it is shapes, shadows, or something that looks like video clips.  Patient states he just cannot take these issues anymore and wants to overdose.  He last used meth around 6 PM.  He denies any other illicit drug use.  Denies any alcohol abuse.  States he has had some homicidal thoughts-- states these thoughts are directed at a plethora of people that he feels are "responsible" for his problems.  He has not attempted to hurt anyone.  States he does take his medications as directed but is not really sure if they are helping him at all.  Past Medical History:  Diagnosis Date  . Allergy   . Anxiety   . Celiac disease   . History of substance abuse   . HIV infection ([place])   . Raynaud disease   . Seizures [facility])     Patient Active Problem List   Diagnosis Date Noted  . Attention deficit hyperactivity disorder (ADHD) 02/08/2017  . PTSD (post-traumatic stress disorder) 02/08/2017  . Auditory hallucination 02/07/2017  . Schizoaffective disorder, bipolar type ([place]) 02/07/2017  . Amphetamine use disorder, severe ([place]) 02/07/2017  . Psychosis ([place]) 02/07/2017  . Otosclerosis of left ear 11/04/2016  .  Conductive hearing loss in left ear 10/21/2016  . Tinnitus of both ears 09/20/2016  . Raynaud's disease without gangrene 09/20/2016  . Polyarthralgia 09/20/2016  . Venereal disease contact 09/20/2016  . Back pain 08/26/2016  . Bilateral hand swelling 08/26/2016  . Screen for STD (sexually transmitted disease) 08/26/2016  . Fatigue 08/08/2016  . Rash and nonspecific skin eruption 08/03/2016  . Anxiety and depression 07/01/2016  . Human immunodeficiency virus (HIV) disease ([place]) 02/26/2015  . Encounter for long-term (current) use of medications 02/26/2015  . Celiac disease 02/26/2015  . History of seizure 12/25/2013  . History of rectal bleeding 12/25/2013  . Hepatitis B core antibody positive 12/25/2013    Past Surgical History:  Procedure Laterality Date  . WISDOM TOOTH EXTRACTION         Home Medications    Prior to Admission medications   Medication Sig Start Date End Date Taking? Authorizing Provider  fluPHENAZine (PROLIXIN) 10 MG tablet TAKE 1 TABLET BY MOUTH AT BEDTIME AS NEEDED FOR HALLUCINATIONS 05/10/17  Yes Dara Hoyer, PA-C  lurasidone (LATUDA) 80 MG TABS tablet Take 1 tablet (80 mg total) by mouth at bedtime. 05/10/17  Yes Dara Hoyer, PA-C  traZODone (DESYREL) 150 MG tablet Take 1 tablet (150 mg total) by mouth at bedtime. 05/10/17  Yes Dara Hoyer, PA-C  TRIUMEQ 600-50-300 MG tablet TAKE 1 TABLET BY MOUTH ONCE EVERY DAY 07/29/16  Yes Comer, Okey Regal, MD  Family History Family History  Problem Relation Age of Onset  . Heart disease Father 71  . Hypertension Father   . Heart disease Maternal Grandfather        early 20s  . Hypertension Maternal Grandfather   . Hypertension Mother   . Rheum arthritis Mother   . Crohn's disease Mother   . Crohn's disease Sister   . Rheum arthritis Maternal Grandmother   . Breast cancer Maternal Grandmother   . Colon cancer Paternal Grandmother   . Hypertension Paternal Grandfather   . Esophageal cancer Neg Hx    . Stomach cancer Neg Hx     Social History Social History   Tobacco Use  . Smoking status: Never Smoker  . Smokeless tobacco: Never Used  Substance Use Topics  . Alcohol use: Yes    Alcohol/week: 1.2 oz    Types: 2 Glasses of wine per week    Comment: occ  . Drug use: Yes    Types: Methamphetamines    Comment: Hx of maijuana, ecstacy, GHB, ketamine, amephetamines, cocaine - nothing since 2009     Allergies   Abilify [aripiprazole] and Gluten meal   Review of Systems Review of Systems  Psychiatric/Behavioral: Positive for suicidal ideas.  All other systems reviewed and are negative.    Physical Exam Updated Vital Signs BP (!) 159/107 (BP Location: Left Arm)   Pulse 88   Temp 97.9 F (36.6 C) (Oral)   Resp 20   Ht 6' 1"  (1.854 m)   Wt 68 kg (150 lb)   SpO2 94%   BMI 19.79 kg/m   Physical Exam  Constitutional: He is oriented to person, place, and time. He appears well-developed and well-nourished.  HENT:  Head: Normocephalic and atraumatic.  Mouth/Throat: Oropharynx is clear and moist.  Eyes: Conjunctivae and EOM are normal. Pupils are equal, round, and reactive to light.  Neck: Normal range of motion.  Cardiovascular: Normal rate, regular rhythm and normal heart sounds.  Pulmonary/Chest: Effort normal and breath sounds normal. No stridor. No respiratory distress.  Abdominal: Soft. Bowel sounds are normal. There is no tenderness. There is no rebound.  Musculoskeletal: Normal range of motion.  Neurological: He is alert and oriented to person, place, and time.  Skin: Skin is warm and dry.  Psychiatric: He has a normal mood and affect. He is actively hallucinating. He expresses homicidal and suicidal ideation. He expresses suicidal plans.  Appears upset and anxious Admits to SI/HI/AVH  Nursing note and vitals reviewed.    ED Treatments / Results  Labs (all labs ordered are listed, but only abnormal results are displayed) Labs Reviewed  COMPREHENSIVE  METABOLIC PANEL - Abnormal; Notable for the following components:      Result Value   Chloride 95 (*)    Glucose, Bld 133 (*)    AST 52 (*)    ALT 115 (*)    All other components within normal limits  ACETAMINOPHEN LEVEL - Abnormal; Notable for the following components:   Acetaminophen (Tylenol), Serum <10 (*)    All other components within normal limits  CBC - Abnormal; Notable for the following components:   Hemoglobin 17.1 (*)    MCHC 36.2 (*)    All other components within normal limits  RAPID URINE DRUG SCREEN, HOSP PERFORMED - Abnormal; Notable for the following components:   Amphetamines POSITIVE (*)    All other components within normal limits  ETHANOL  SALICYLATE LEVEL    EKG  EKG Interpretation None  Radiology No results found.  Procedures Procedures (including critical care time)  Medications Ordered in ED Medications - No data to display   Initial Impression / Assessment and Plan / ED Course  I have reviewed the triage vital signs and the nursing notes.  Pertinent labs & imaging results that were available during my care of the patient were reviewed by me and considered in my medical decision making (see chart for details).  42 year old male here with suicidal ideation with plan to OD on meth.  Has had some hallucinations and HI as well. Last meth use at 6 PM.  Patient has no physical complaints at this time.  Screening labs reviewed, mild elevation of LFTs.  Has hx of hep B in the past. Denies any current abdominal pain.  Medically cleared.  Patient given meal here to eat.  1:33 AM TTS recommends observation overnight, psych re-evaluate in the morning.  Home meds ordered.  Final Clinical Impressions(s) / ED Diagnoses   Final diagnoses:  Suicidal ideation  Methamphetamine abuse [facility])    ED Discharge Orders    None       Larene Pickett, PA-C 05/20/17 0402    Rolland Porter, MD 05/20/17 [phone]

## 2017-05-19 NOTE — Progress Notes (Signed)
Pt will continue goals of sobriety from all mind-altering chemicals. Pt will continue to engage 12 Step/recovery community through weekly meetings and/or meeting w/ sponsor. Pt will learn new coping skills to effectively manage recovery-based lifestyle and avoiding relapse 2. Pt will gain insight into mental health issues. Pt will learn to utilize coping skills for effective management of depression, anxiety, trauma, and/or substance abuse

## 2017-05-19 NOTE — ED Notes (Signed)
Pt has been wanded, belongings removed, PA has seen pt, will put in TTS. Labs/urine complete

## 2017-05-19 NOTE — ED Triage Notes (Signed)
Pt states he is hearing voices that are telling him to harm himself. Pt says he has a plan to "take drugs and want to kill myself". Pt used meth around 6pm, no etoh.

## 2017-05-19 NOTE — Progress Notes (Signed)
  Weekly Group Progress Note  Program: OUTPATIENT SKILLS GROUP  Group Time: 5:30-6:30pm  Participation Level: Active  Behavioral Response: Appropriate and Sharing  Type of Therapy:  Psycho-education Group  Skills discussed: CBT ABC   Summary of Progress: Pt was active and engaged in his first group since being d/c from CD-IOP last week. He states his recovery is going well, he continues to attend meetings, talk to his sponsor, and denies cravings. He continues to struggle w/ "negative voices". Pt stated he was feeling "black", as a color, since he was going through a lot of mourning and grief from family members and friends who had recently passed. Pt identified his ABC event as "being frustrated that people were coming into AA meeting late".  Summary of Group: Pts were active and engaged in session. Counselor asked pts to state "what color they felt like today" to build self awareness and abstraction. 2 new group members were present and shared openly about their struggle w/ substance addiction. Counselor showed brief video describing ABC model and how it can help pts to minimize and challenge their negative beliefs about activating events.   Archie Balboa, LPCA, LCASA

## 2017-05-19 NOTE — ED Notes (Signed)
Bed: WLPT3 Expected date:  Expected time:  Means of arrival:  Comments: 

## 2017-05-20 ENCOUNTER — Other Ambulatory Visit: Payer: Self-pay

## 2017-05-20 DIAGNOSIS — F1510 Other stimulant abuse, uncomplicated: Secondary | ICD-10-CM

## 2017-05-20 MED ORDER — LURASIDONE HCL 40 MG PO TABS
80.0000 mg | ORAL_TABLET | Freq: Every day | ORAL | Status: DC
  Administered 2017-05-20: 80 mg via ORAL
  Filled 2017-05-20: qty 2

## 2017-05-20 MED ORDER — GABAPENTIN 300 MG PO CAPS
300.0000 mg | ORAL_CAPSULE | Freq: Two times a day (BID) | ORAL | Status: DC
  Administered 2017-05-20 – 2017-05-21 (×3): 300 mg via ORAL
  Filled 2017-05-20 (×3): qty 1

## 2017-05-20 MED ORDER — ABACAVIR-DOLUTEGRAVIR-LAMIVUD 600-50-300 MG PO TABS
1.0000 | ORAL_TABLET | Freq: Every day | ORAL | Status: DC
  Administered 2017-05-20 – 2017-05-21 (×2): 1 via ORAL
  Filled 2017-05-20 (×2): qty 1

## 2017-05-20 MED ORDER — TRAZODONE HCL 50 MG PO TABS
150.0000 mg | ORAL_TABLET | Freq: Every day | ORAL | Status: DC
  Administered 2017-05-20 (×2): 150 mg via ORAL
  Filled 2017-05-20 (×2): qty 1

## 2017-05-20 MED ORDER — FLUPHENAZINE HCL 5 MG PO TABS
10.0000 mg | ORAL_TABLET | Freq: Every day | ORAL | Status: DC
  Administered 2017-05-20 (×2): 10 mg via ORAL
  Filled 2017-05-20 (×3): qty 2

## 2017-05-20 NOTE — Patient Outreach (Signed)
ED Peer Support Specialist Patient Intake (Complete at intake & 30-60 Day Follow-up)  Name: John Robbins  MRN: [id number]  Age: 42 y.o.   Date of Admission: 05/20/2017  Intake: Initial Comments:      Primary Reason AdmittedPt states he is hearing voices that are telling him to harm himself. Pt says he has a plan to "take drugs and want to kill myself". Pt used meth around 6pm, no etoh.   :   Lab values: Alcohol/ETOH: Negative Positive UDS? No Amphetamines: Yes Barbiturates: No Benzodiazepines: No Cocaine: No Opiates: No Cannabinoids: No  Demographic information: Gender: Male Ethnicity: White Marital Status: Single Insurance Status: ([employer]) Ecologist (Work Neurosurgeon, Physicist, medical, etc.: No Lives with: Alone Living situation: House/Apartment  Reported Patient History: Patient reported health conditions: Bipolar disorder, Schizophrenia Patient aware of HIV and hepatitis status: Yes (comment)(HIV)  In past year, has patient visited ED for any reason? Yes  Number of ED visits:    Reason(s) for visit: Same situation  In past year, has patient been hospitalized for any reason? No  Number of hospitalizations:    Reason(s) for hospitalization:    In past year, has patient been arrested? No  Number of arrests:    Reason(s) for arrest:    In past year, has patient been incarcerated? No  Number of incarcerations:    Reason(s) for incarceration:    In past year, has patient received medication-assisted treatment? Yes, [facility] Treatment Programs ([facility])  In past year, patient received the following treatments: Residential treatment (non-hospital)  In past year, has patient received any harm reduction services? No  Did this include any of the following?    In past year, has patient received care from a mental health provider for diagnosis other than SUD? No  In past year, is this first time patient has overdosed?  No  Number of past overdoses:    In past year, is this first time patient has been hospitalized for an overdose? No  Number of hospitalizations for overdose(s):    Is patient currently receiving treatment for a mental health diagnosis? Yes(Medications )  Patient reports experiencing difficulty participating in SUD treatment: Yes    Most important reason(s) for this difficulty? Did not seek treatment/was not ready to initiate treatment  Has patient received prior services for treatment? No  In past, patient has received services from following agencies:    Plan of Care:  Suggested follow up at these agencies/treatment centers: [facility]), [place]  Other information: CPSS met with Pt for motivational interviewing and to monitor services. CPSS talked with Pt attempt to understand what Pt was dealing with at the time. CPSS talked with Pt to see what services he wants to try to receive. CPSS processed with Pt about him trying to get services for the substance or for the Mental health concerns. CPSS asked Pt if it will be alright to fax his information to [facility]. CPSS is waiting to hear back from them.    Aaron Edelman Riana Tessmer, CPSS  05/20/2017 2:18 PM
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
## 2017-05-20 NOTE — ED Notes (Signed)
Patient denies SI/HI/AVH at this time. Plan of care discussed. Encouragement and support provided and safety maintain. Q 15 min safety checks remain in place and video monitoring.

## 2017-05-21 ENCOUNTER — Encounter (HOSPITAL_COMMUNITY): Payer: Self-pay | Admitting: *Deleted

## 2017-05-21 ENCOUNTER — Inpatient Hospital Stay (HOSPITAL_COMMUNITY)
Admission: AD | Admit: 2017-05-21 | Discharge: 2017-05-26 | DRG: 885 | Disposition: A | Discharge status: Home / Self Care | Payer: 59 | Source: Intra-hospital | Attending: Psychiatry | Admitting: Psychiatry

## 2017-05-21 ENCOUNTER — Other Ambulatory Visit: Payer: Self-pay

## 2017-05-21 DIAGNOSIS — G4700 Insomnia, unspecified: Secondary | ICD-10-CM | POA: Diagnosis not present

## 2017-05-21 DIAGNOSIS — Z888 Allergy status to other drugs, medicaments and biological substances status: Secondary | ICD-10-CM | POA: Diagnosis not present

## 2017-05-21 DIAGNOSIS — F19959 Other psychoactive substance use, unspecified with psychoactive substance-induced psychotic disorder, unspecified: Secondary | ICD-10-CM | POA: Diagnosis not present

## 2017-05-21 DIAGNOSIS — F909 Attention-deficit hyperactivity disorder, unspecified type: Secondary | ICD-10-CM | POA: Diagnosis present

## 2017-05-21 DIAGNOSIS — I7300 Raynaud's syndrome without gangrene: Secondary | ICD-10-CM | POA: Diagnosis present

## 2017-05-21 DIAGNOSIS — F333 Major depressive disorder, recurrent, severe with psychotic symptoms: Principal | ICD-10-CM | POA: Diagnosis present

## 2017-05-21 DIAGNOSIS — Z62810 Personal history of physical and sexual abuse in childhood: Secondary | ICD-10-CM | POA: Diagnosis present

## 2017-05-21 DIAGNOSIS — Z79899 Other long term (current) drug therapy: Secondary | ICD-10-CM

## 2017-05-21 DIAGNOSIS — Z21 Asymptomatic human immunodeficiency virus [HIV] infection status: Secondary | ICD-10-CM | POA: Diagnosis present

## 2017-05-21 DIAGNOSIS — Z8261 Family history of arthritis: Secondary | ICD-10-CM

## 2017-05-21 DIAGNOSIS — H9192 Unspecified hearing loss, left ear: Secondary | ICD-10-CM | POA: Diagnosis present

## 2017-05-21 DIAGNOSIS — F1510 Other stimulant abuse, uncomplicated: Secondary | ICD-10-CM | POA: Diagnosis not present

## 2017-05-21 DIAGNOSIS — R45851 Suicidal ideations: Secondary | ICD-10-CM | POA: Diagnosis present

## 2017-05-21 DIAGNOSIS — F1520 Other stimulant dependence, uncomplicated: Secondary | ICD-10-CM | POA: Diagnosis not present

## 2017-05-21 DIAGNOSIS — Z915 Personal history of self-harm: Secondary | ICD-10-CM

## 2017-05-21 DIAGNOSIS — Z803 Family history of malignant neoplasm of breast: Secondary | ICD-10-CM

## 2017-05-21 DIAGNOSIS — Z91018 Allergy to other foods: Secondary | ICD-10-CM | POA: Diagnosis not present

## 2017-05-21 DIAGNOSIS — Z818 Family history of other mental and behavioral disorders: Secondary | ICD-10-CM | POA: Diagnosis not present

## 2017-05-21 DIAGNOSIS — T782XXA Anaphylactic shock, unspecified, initial encounter: Secondary | ICD-10-CM | POA: Diagnosis not present

## 2017-05-21 DIAGNOSIS — Z8249 Family history of ischemic heart disease and other diseases of the circulatory system: Secondary | ICD-10-CM | POA: Diagnosis not present

## 2017-05-21 DIAGNOSIS — F4310 Post-traumatic stress disorder, unspecified: Secondary | ICD-10-CM | POA: Diagnosis present

## 2017-05-21 DIAGNOSIS — J019 Acute sinusitis, unspecified: Secondary | ICD-10-CM | POA: Diagnosis not present

## 2017-05-21 DIAGNOSIS — K900 Celiac disease: Secondary | ICD-10-CM | POA: Diagnosis present

## 2017-05-21 DIAGNOSIS — Z800 Family history of malignant neoplasm of digestive organs: Secondary | ICD-10-CM | POA: Diagnosis not present

## 2017-05-21 MED ORDER — DIPHENHYDRAMINE HCL 25 MG PO CAPS: 25.0000 mg | ORAL_CAPSULE | ORAL | Status: DC | PRN

## 2017-05-21 MED ORDER — ABACAVIR-DOLUTEGRAVIR-LAMIVUD 600-50-300 MG PO TABS
1.0000 | ORAL_TABLET | Freq: Every day | ORAL | Status: DC
  Administered 2017-05-22 – 2017-05-26 (×5): 1 via ORAL
  Filled 2017-05-21 (×6): qty 1

## 2017-05-21 MED ORDER — GI COCKTAIL ~~LOC~~
30.0000 mL | Freq: Once | ORAL | Status: DC
  Filled 2017-05-21: qty 30

## 2017-05-21 MED ORDER — ALUM & MAG HYDROXIDE-SIMETH 200-200-20 MG/5ML PO SUSP: 30.0000 mL | ORAL | Status: DC | PRN

## 2017-05-21 MED ORDER — DIPHENHYDRAMINE HCL 50 MG/ML IJ SOLN
50.0000 mg | Freq: Once | INTRAMUSCULAR | Status: AC
  Administered 2017-05-21: 50 mg via INTRAVENOUS
  Filled 2017-05-21: qty 1

## 2017-05-21 MED ORDER — GABAPENTIN 300 MG PO CAPS
300.0000 mg | ORAL_CAPSULE | Freq: Two times a day (BID) | ORAL | Status: DC
  Administered 2017-05-21 – 2017-05-23 (×4): 300 mg via ORAL
  Filled 2017-05-21 (×5): qty 1

## 2017-05-21 MED ORDER — FLUPHENAZINE HCL 5 MG PO TABS: 10.0000 mg | ORAL_TABLET | Freq: Every evening | ORAL | Status: DC | PRN

## 2017-05-21 MED ORDER — PSEUDOEPHEDRINE HCL 60 MG PO TABS
60.0000 mg | ORAL_TABLET | Freq: Four times a day (QID) | ORAL | Status: DC | PRN
  Filled 2017-05-21: qty 1

## 2017-05-21 MED ORDER — EPINEPHRINE 0.3 MG/0.3ML IJ SOAJ
0.3000 mg | Freq: Once | INTRAMUSCULAR | Status: AC
  Administered 2017-05-21: 0.3 mg via INTRAMUSCULAR
  Filled 2017-05-21: qty 0.3

## 2017-05-21 MED ORDER — METHYLPREDNISOLONE SODIUM SUCC 125 MG IJ SOLR
125.0000 mg | Freq: Once | INTRAMUSCULAR | Status: AC
  Administered 2017-05-21: 125 mg via INTRAVENOUS
  Filled 2017-05-21: qty 2

## 2017-05-21 MED ORDER — FAMOTIDINE IN NACL 20-0.9 MG/50ML-% IV SOLN
20.0000 mg | Freq: Once | INTRAVENOUS | Status: AC
  Administered 2017-05-21: 20 mg via INTRAVENOUS
  Filled 2017-05-21: qty 50

## 2017-05-21 MED ORDER — LURASIDONE HCL 80 MG PO TABS
80.0000 mg | ORAL_TABLET | Freq: Every day | ORAL | Status: DC
  Filled 2017-05-21: qty 1

## 2017-05-21 MED ORDER — GABAPENTIN 300 MG PO CAPS
ORAL_CAPSULE | ORAL | Status: AC
  Filled 2017-05-21: qty 1

## 2017-05-21 MED ORDER — MAGNESIUM HYDROXIDE 400 MG/5ML PO SUSP: 30.0000 mL | Freq: Every day | ORAL | Status: DC | PRN

## 2017-05-21 MED ORDER — TRAZODONE HCL 150 MG PO TABS
150.0000 mg | ORAL_TABLET | Freq: Every day | ORAL | Status: DC
  Administered 2017-05-21 – 2017-05-22 (×2): 150 mg via ORAL
  Filled 2017-05-21 (×4): qty 1

## 2017-05-21 MED ORDER — ACETAMINOPHEN 325 MG PO TABS: 650.0000 mg | ORAL_TABLET | Freq: Four times a day (QID) | ORAL | Status: DC | PRN

## 2017-05-21 NOTE — ED Notes (Signed)
Patient left the unit in stable condition via [facility] transport. Ambulatory. No distress noted. Vital signs within normal limit. Belongings given back to patient.

## 2017-05-21 NOTE — ED Notes (Addendum)
Got report from social worker that patient has been accepted to [facility]. Will be going after 10 pm. Dr. Gilford Raid notified and patient made aware.

## 2017-05-21 NOTE — Consult Note (Signed)
[place] Psychiatry Consult   Reason for Consult:  Hallucinations  Referring Physician:  EDP Patient Identification: John Robbins MRN:  [id number] Principal Diagnosis: Substance-induced psychotic disorder [facility]) Diagnosis:   Patient Active Problem List   Diagnosis Date Noted  . Amphetamine use disorder, severe ([place]) [F15.20] 02/07/2017    Priority: High  . Substance-induced psychotic disorder [facility]) [F19.959] 02/07/2017    Priority: High  . Methamphetamine use disorder, severe ([place]) [F15.20] 05/20/2017  . Attention deficit hyperactivity disorder (ADHD) [F90.9] 02/08/2017  . PTSD (post-traumatic stress disorder) [F43.10] 02/08/2017  . Auditory hallucination [R44.0] 02/07/2017  . Schizoaffective disorder, bipolar type ([place]) [F25.0] 02/07/2017  . Otosclerosis of left ear [H80.92] 11/04/2016  . Conductive hearing loss in left ear [H90.12] 10/21/2016  . Tinnitus of both ears [H93.13] 09/20/2016  . Raynaud's disease without gangrene [I73.00] 09/20/2016  . Polyarthralgia [M25.50] 09/20/2016  . Venereal disease contact [Z20.2] 09/20/2016  . Back pain [M54.9] 08/26/2016  . Bilateral hand swelling [M79.89] 08/26/2016  . Screen for STD (sexually transmitted disease) [Z11.3] 08/26/2016  . Fatigue [R53.83] 08/08/2016  . Rash and nonspecific skin eruption [R21] 08/03/2016  . Anxiety and depression [F41.9, F32.9] 07/01/2016  . Human immunodeficiency virus (HIV) disease ([place]) [B20] 02/26/2015  . Encounter for long-term (current) use of medications [Z79.899] 02/26/2015  . Celiac disease [K90.0] 02/26/2015  . History of seizure [Z87.898] 12/25/2013  . History of rectal bleeding [Z87.19] 12/25/2013  . Hepatitis B core antibody positive [R76.8] 12/25/2013    Total Time spent with patient: 45 minutes  Subjective:   John Robbins is a 42 y.o. male patient admitted with psychosis.  HPI:  42 yo male who presented to the ED with hallucinations telling him to hurt himself and  to do drugs.  Suicidal ideations with a plan to overdose.  He has been using meth but reports the voices started years ago before his meth use.  No homicidal ideations or withdrawal symptoms.    Past Psychiatric History: depression, anxiety, substance abuse  Risk to Self: Suicidal Ideation: Yes-Currently Present Suicidal Intent: No Is patient at risk for suicide?: Yes Suicidal Plan?: No Access to Means: No What has been your use of drugs/alcohol within the last 12 months?: Methamphetamine, heroin How many times?: 1 Other Self Harm Risks: None Triggers for Past Attempts: Unpredictable Intentional Self Injurious Behavior: None Risk to Others: Homicidal Ideation: No Thoughts of Harm to Others: No Current Homicidal Intent: No Current Homicidal Plan: No Access to Homicidal Means: No Identified Victim: No one History of harm to others?: No Assessment of Violence: None Noted Violent Behavior Description: None reported. Does patient have access to weapons?: No Criminal Charges Pending?: No Does patient have a court date: No Prior Inpatient Therapy: Prior Inpatient Therapy: Yes Prior Therapy Dates: May & October 2018 Prior Therapy Facilty/Provider(s): [facility] Reason for Treatment: SA and SI Prior Outpatient Therapy: Prior Outpatient Therapy: Yes Prior Therapy Dates: November '18 to curent Prior Therapy Facilty/Provider(s): [facility] outpatient Reason for Treatment: outpt SAIOP Does patient have an ACCT team?: No Does patient have Intensive In-House Services?  : No Does patient have [facility] services? : No Does patient have P4CC services?: No  Past Medical History:  Past Medical History:  Diagnosis Date  . Allergy   . Anxiety   . Celiac disease   . History of substance abuse   . HIV infection ([place])   . Raynaud disease   . Seizures ([place])     Past Surgical History:  Procedure Laterality  Date  . WISDOM TOOTH EXTRACTION     Family History:  Family History  Problem  Relation Age of Onset  . Heart disease Father 65  . Hypertension Father   . Heart disease Maternal Grandfather        early 71s  . Hypertension Maternal Grandfather   . Hypertension Mother   . Rheum arthritis Mother   . Crohn's disease Mother   . Crohn's disease Sister   . Rheum arthritis Maternal Grandmother   . Breast cancer Maternal Grandmother   . Colon cancer Paternal Grandmother   . Hypertension Paternal Grandfather   . Esophageal cancer Neg Hx   . Stomach cancer Neg Hx    Family Psychiatric  History: none Social History:  Social History   Substance and Sexual Activity  Alcohol Use Yes  . Alcohol/week: 1.2 oz  . Types: 2 Glasses of wine per week   Comment: occ     Social History   Substance and Sexual Activity  Drug Use Yes  . Types: Methamphetamines   Comment: Hx of maijuana, ecstacy, GHB, ketamine, amephetamines, cocaine - nothing since 2009    Social History   Socioeconomic History  . Marital status: Unknown    Spouse name: None  . Number of children: None  . Years of education: None  . Highest education level: None  Social Needs  . Financial resource strain: None  . Food insecurity - worry: None  . Food insecurity - inability: None  . Transportation needs - medical: None  . Transportation needs - non-medical: None  Occupational History  . None  Tobacco Use  . Smoking status: Never Smoker  . Smokeless tobacco: Never Used  Substance and Sexual Activity  . Alcohol use: Yes    Alcohol/week: 1.2 oz    Types: 2 Glasses of wine per week    Comment: occ  . Drug use: Yes    Types: Methamphetamines    Comment: Hx of maijuana, ecstacy, GHB, ketamine, amephetamines, cocaine - nothing since 2009  . Sexual activity: Yes    Partners: Male    Comment: Uses Condoms  Other Topics Concern  . None  Social History Narrative   Works in Engineer, technical sales.   Additional Social History:    Allergies:   Allergies  Allergen Reactions  . Abilify [Aripiprazole] Other (See  Comments)    Akathesia  . Gluten Meal Other (See Comments)    Neurological    Labs:  Results for orders placed or performed during the hospital encounter of 05/19/17 (from the past 48 hour(s))  Rapid urine drug screen (hospital performed)     Status: Abnormal   Collection Time: 05/19/17 10:35 PM  Result Value Ref Range   Opiates NONE DETECTED NONE DETECTED   Cocaine NONE DETECTED NONE DETECTED   Benzodiazepines NONE DETECTED NONE DETECTED   Amphetamines POSITIVE (A) NONE DETECTED   Tetrahydrocannabinol NONE DETECTED NONE DETECTED   Barbiturates NONE DETECTED NONE DETECTED    Comment: (NOTE) DRUG SCREEN FOR MEDICAL PURPOSES ONLY.  IF CONFIRMATION IS NEEDED FOR ANY PURPOSE, NOTIFY LAB WITHIN 5 DAYS. LOWEST DETECTABLE LIMITS FOR URINE DRUG SCREEN Drug Class                     Cutoff (ng/mL) Amphetamine and metabolites    1000 Barbiturate and metabolites    200 Benzodiazepine                 213 Tricyclics and metabolites     300  Opiates and metabolites        300 Cocaine and metabolites        300 THC                            50 Performed at [facility] [address], [place], [place] [address]   Comprehensive metabolic panel     Status: Abnormal   Collection Time: 05/19/17 11:04 PM  Result Value Ref Range   Sodium 135 135 - 145 mmol/L   Potassium 3.5 3.5 - 5.1 mmol/L   Chloride 95 (L) 101 - 111 mmol/L   CO2 25 22 - 32 mmol/L   Glucose, Bld 133 (H) 65 - 99 mg/dL   BUN 14 6 - 20 mg/dL   Creatinine, Ser 1.06 0.61 - 1.24 mg/dL   Calcium 9.2 8.9 - 10.3 mg/dL   Total Protein 8.1 6.5 - 8.1 g/dL   Albumin 4.8 3.5 - 5.0 g/dL   AST 52 (H) 15 - 41 U/L   ALT 115 (H) 17 - 63 U/L   Alkaline Phosphatase 92 38 - 126 U/L   Total Bilirubin 0.8 0.3 - 1.2 mg/dL   GFR calc non Af Amer >60 >60 mL/min   GFR calc Af Amer >60 >60 mL/min    Comment: (NOTE) The eGFR has been calculated using the CKD EPI equation. This calculation has not been validated in all  clinical situations. eGFR's persistently <60 mL/min signify possible Chronic Kidney Disease.    Anion gap 15 5 - 15    Comment: Performed at [facility], [place] [address], [place], [place] [address]  Ethanol     Status: None   Collection Time: 05/19/17 11:04 PM  Result Value Ref Range   Alcohol, Ethyl (B) <10 <10 mg/dL    Comment:        LOWEST DETECTABLE LIMIT FOR SERUM ALCOHOL IS 10 mg/dL FOR MEDICAL PURPOSES ONLY Performed at [facility] [address], [place], [place] [address]   Salicylate level     Status: None   Collection Time: 05/19/17 11:04 PM  Result Value Ref Range   Salicylate Lvl <2.8 2.8 - 30.0 mg/dL    Comment: Performed at [facility], [place] [address], [place], [place] [address]  Acetaminophen level     Status: Abnormal   Collection Time: 05/19/17 11:04 PM  Result Value Ref Range   Acetaminophen (Tylenol), Serum <10 (L) 10 - 30 ug/mL    Comment:        THERAPEUTIC CONCENTRATIONS VARY SIGNIFICANTLY. A RANGE OF 10-30 ug/mL MAY BE AN EFFECTIVE CONCENTRATION FOR MANY PATIENTS. HOWEVER, SOME ARE BEST TREATED AT CONCENTRATIONS OUTSIDE THIS RANGE. ACETAMINOPHEN CONCENTRATIONS >150 ug/mL AT 4 HOURS AFTER INGESTION AND >50 ug/mL AT 12 HOURS AFTER INGESTION ARE OFTEN ASSOCIATED WITH TOXIC REACTIONS. Performed at [facility], [place] [address] [place],  [address]   cbc     Status: Abnormal   Collection Time: 05/19/17 11:04 PM  Result Value Ref Range   WBC 9.5 4.0 - 10.5 K/uL   RBC 5.44 4.22 - 5.81 MIL/uL   Hemoglobin 17.1 (H) 13.0 - 17.0 g/dL   HCT 47.2 39.0 - 52.0 %   MCV 86.8 78.0 - 100.0 fL   MCH 31.4 26.0 - 34.0 pg   MCHC 36.2 (H) 30.0 - 36.0 g/dL   RDW 12.4 11.5 - 15.5 %   Platelets 209 150 -  400 K/uL    Comment: Performed at [facility], [place] [address], [place], [place] [address]    Current Facility-Administered Medications  Medication Dose Route  Frequency Provider Last Rate Last Dose  . abacavir-dolutegravir-lamiVUDine (TRIUMEQ) [id number] MG per tablet 1 tablet  1 tablet Oral Daily Larene Pickett, PA-C   1 tablet at 05/21/17 3716  . fluPHENAZine (PROLIXIN) tablet 10 mg  10 mg Oral QHS Larene Pickett, PA-C   10 mg at 05/20/17 2118  . gabapentin (NEURONTIN) capsule 300 mg  300 mg Oral BID Corena Pilgrim, MD   300 mg at 05/21/17 0951  . traZODone (DESYREL) tablet 150 mg  150 mg Oral QHS Larene Pickett, PA-C   150 mg at 05/20/17 2118   Current Outpatient Medications  Medication Sig Dispense Refill  . fluPHENAZine (PROLIXIN) 10 MG tablet TAKE 1 TABLET BY MOUTH AT BEDTIME AS NEEDED FOR HALLUCINATIONS 30 tablet 1  . lurasidone (LATUDA) 80 MG TABS tablet Take 1 tablet (80 mg total) by mouth at bedtime. 90 tablet 1  . traZODone (DESYREL) 150 MG tablet Take 1 tablet (150 mg total) by mouth at bedtime. 90 tablet 1  . TRIUMEQ 600-50-300 MG tablet TAKE 1 TABLET BY MOUTH ONCE EVERY DAY 90 tablet 2    Musculoskeletal: Strength & Muscle Tone: within normal limits Gait & Station: normal Patient leans: N/A  Psychiatric Specialty Exam: Physical Exam  Constitutional: He is oriented to person, place, and time. He appears well-developed and well-nourished.  HENT:  Head: Normocephalic.  Neck: Normal range of motion.  Respiratory: Effort normal.  Musculoskeletal: Normal range of motion.  Neurological: He is alert and oriented to person, place, and time.  Psychiatric: His speech is normal. Judgment normal. His mood appears anxious. He is actively hallucinating. Thought content is paranoid. Cognition and memory are normal. He exhibits a depressed mood. He expresses suicidal ideation. He expresses suicidal plans.    Review of Systems  Psychiatric/Behavioral: Positive for depression, hallucinations, substance abuse and suicidal ideas. The patient is nervous/anxious.   All other systems reviewed and are negative.   Blood pressure 119/72, pulse  69, temperature 98.2 F (36.8 C), temperature source Oral, resp. rate 18, height _0  (1.854 m), weight 68 kg (150 lb), SpO2 99 %.Body mass index is 19.79 kg/m.  General Appearance: Casual  Eye Contact:  Fair  Speech:  Normal Rate  Volume:  Normal  Mood:  Anxious and Depressed  Affect:  Blunt  Thought Process:  Coherent and Descriptions of Associations: Intact  Orientation:  Full (Time, Place, and Person)  Thought Content:  Hallucinations: Auditory Visual and Paranoid Ideation  Suicidal Thoughts:  Yes.  with intent/plan  Homicidal Thoughts:  No  Memory:  Immediate;   Fair Recent;   Fair Remote;   Fair  Judgement:  Impaired  Insight:  Fair  Psychomotor Activity:  Normal  Concentration:  Concentration: Fair and Attention Span: Fair  Recall:  [employer] of Knowledge:  Fair  Language:  Good  Akathisia:  No  Handed:  Right  AIMS (if indicated):     Assets:  Leisure Time Physical Health Resilience  ADL's:  Intact  Cognition:  WNL  Sleep:        Treatment Plan Summary: Daily contact with patient to assess and evaluate symptoms and progress in treatment, Medication management and Plan substance induced psychotic disorder:  -Crisis stabilization -Medication management:  Medical medications restarted along with Prolixin 10 mg at bedtime for psychosis and Trazodone  150 mg at bedtime for sleep,  discontinued Latuda.  Started gabapentin 300 mg BID for withdrawal symptoms  -Individual and substance abuse counseling -Peer support consult  Disposition: Recommend psychiatric Inpatient admission when medically cleared.  Waylan Boga, NP 05/21/2017 10:44 AM  Patient seen face-to-face for psychiatric evaluation, chart reviewed and case discussed with the physician extender and developed treatment plan. Reviewed the information documented and agree with the treatment plan. Corena Pilgrim, MD

## 2017-05-21 NOTE — BH Assessment (Signed)
[place] Assessment Progress Note  Jamie from [facility] called to decline pt due to current increased acuity on their units.  Kenna Gilbert. Lovena Le, [place], [place], LPCA Counselor

## 2017-05-21 NOTE — ED Provider Notes (Signed)
Pt has been accepted for transfer at [facility].  He is stable for transfer.   Isla Pence, MD 05/21/17 2012

## 2017-05-21 NOTE — Progress Notes (Addendum)
Update: Patient declined by [facility] due to medication barriers.   CSW received a call from [facility] with [facility] stating facility is able to accept patient IF able to receive 2 weeks of HIV medication. CSW requested RN for assistance with contacting pharmacy- will follow up with [facility].   Kingsley Spittle, [facility] Emergency Room Clinical Social Worker [phone]

## 2017-05-21 NOTE — Progress Notes (Signed)
Admission note:  Pt is a 42 year old Caucasian male admitted to the services of Dr. Parke Poisson due to hearing voices and feeling suicidal due to this.  Pt had plan to overdose on his medications.  Pt is HIV +.  Pt states that he has been hearing voices in increasing frequency over the last year and a half.  Pt uses crystal meth but denies other drug use to this staff member.  Pt denies suicidal ideation at this time but states that his goal is to end the voices.  Pt is hopeful to get on new medication that will better help him.  Pt denies alcohol or tobacco use.  Pt stated that he lives alone in an apartment but it was stated elsewhere in the record that he is homeless.  Pt lists his mother, Lucilla Lame, as his greatest support.  Pt is cooperative with the admission process.

## 2017-05-21 NOTE — ED Notes (Signed)
He is sleeping soundly. His skin is normal, warm and dry and he is breathing normally.

## 2017-05-21 NOTE — Patient Outreach (Signed)
CPSS met with Pt and talked with him about faxing his information to [facility]. CPSS made Pt aware that he has been placed on a waiting list and are waiting to hear back from them. CPSS was made aware that Pt wanting [facility]. CPSS contact them and was made aware that thee will be a waiting list to get him in.

## 2017-05-21 NOTE — ED Notes (Signed)
[facility] called.

## 2017-05-21 NOTE — Tx Team (Signed)
Initial Treatment Plan 05/21/2017 11:33 PM John Robbins [id number]    PATIENT STRESSORS: Health problems Medication change or noncompliance Substance abuse   PATIENT STRENGTHS: Average or above average intelligence Capable of independent living Motivation for treatment/growth   PATIENT IDENTIFIED PROBLEMS: Psychosis (auditory hallucinations)  Substance abuse  Depression  Suicidal ideation  "Make the voices stop"             DISCHARGE CRITERIA:  Adequate post-discharge living arrangements Improved stabilization in mood, thinking, and/or behavior Motivation to continue treatment in a less acute level of care Need for constant or close observation no longer present Withdrawal symptoms are absent or subacute and managed without 24-hour nursing intervention  PRELIMINARY DISCHARGE PLAN: Attend 12-step recovery group Outpatient therapy Placement in alternative living arrangements  PATIENT/FAMILY INVOLVEMENT: This treatment plan has been presented to and reviewed with the patient, John Robbins.  The patient and family have been given the opportunity to ask questions and make suggestions.  Margaretann Loveless, RN 05/21/2017, 11:33 PM

## 2017-05-21 NOTE — ED Notes (Signed)
He had c/o "fullness" in the back of his throat/tongue area while eating lunch. He was brought to rm. 11 from Acute; at which time he was met here by Dr. Venora Maples. He has rec'd. I.M. Epi and IV Solu Medrol and Benadryl and Pepcid. He now tells me he feels "a lot better--and now my voice is even normal". He has no requests at this time.

## 2017-05-21 NOTE — Progress Notes (Addendum)
This patient continues to meet inpatient criteria. CSW fax information to the following facilities:   [facility]  Declined:  [place], [place] Emergency Room Clinical Social Worker [phone]

## 2017-05-21 NOTE — ED Notes (Signed)
Patient alert, calm at breakfast.  Patient reported he had not slept well during the night.

## 2017-05-21 NOTE — ED Provider Notes (Signed)
Pt observed after receiving epi and feels much better.  He does have some sinus congestion, so I will put him on benadryl and sudafed.  He is stable to go back to acute care.   Isla Pence, MD 05/21/17 [phone]

## 2017-05-21 NOTE — ED Notes (Signed)
Patient moved back to Acute Care Unit.  Eating dinner at bedside.

## 2017-05-21 NOTE — ED Notes (Signed)
Patient taking shower.  Linens changed.

## 2017-05-21 NOTE — Progress Notes (Signed)
Pt has been accepted to [facility] 306-2 at 22:00. Attending provider will be Dr. Parke Poisson. Call to report [phone]. Nursing staff aware of acceptance.   Lind Covert, MSW, LCSW Therapeutic Triage Specialist  [phone]

## 2017-05-21 NOTE — ED Notes (Signed)
Report given to [employer].  Patient to go over at 22:00. To call [facility] @ 21:30

## 2017-05-21 NOTE — ED Provider Notes (Signed)
1:23 PM Patient developed acute sensation of swelling in the back of his throat and difficulty breathing.  He had more difficulty tolerating secretions.  His uvula was midline.  There is no obvious stridor on examination but given patient's acute onset of symptoms he was brought to the main by the emergency department and will be treated for acute anaphylaxis.  Placed on the monitor.  O2 sats 99%.  Heart rate 84.  Patient given IM epinephrine in the right thigh.  IV Pepcid, IV Solu-Medrol, IV Benadryl.  Will observe the patient in the emergency department for several hours.  He appears to be protecting his airway.  I will continue to follow closely.  CRITICAL CARE Performed by: Jola Schmidt Total critical care time: 33 minutes Critical care time was exclusive of separately billable procedures and treating other patients. Critical care was necessary to treat or prevent imminent or life-threatening deterioration. Critical care was time spent personally by me on the following activities: development of treatment plan with patient and/or surrogate as well as nursing, discussions with consultants, evaluation of patient's response to treatment, examination of patient, obtaining history from patient or surrogate, ordering and performing treatments and interventions, ordering and review of laboratory studies, ordering and review of radiographic studies, pulse oximetry and re-evaluation of patient's condition.   1:57 PM Significant improvement in symptoms at this time.  We will continue to monitor.  Tolerating secretions.  Oral airway patent.  Lying back comfortably at this time.  This is likely anaphylaxis.  Unclear etiology what he had an anaphylactic reaction to.  At this time after 3-hour observation in the emergency department he can resume his mental health course.   Jola Schmidt, MD 05/21/17 [phone]

## 2017-05-22 ENCOUNTER — Other Ambulatory Visit (HOSPITAL_COMMUNITY): Payer: 59

## 2017-05-22 ENCOUNTER — Other Ambulatory Visit: Payer: Self-pay

## 2017-05-22 DIAGNOSIS — F333 Major depressive disorder, recurrent, severe with psychotic symptoms: Principal | ICD-10-CM

## 2017-05-22 DIAGNOSIS — F1520 Other stimulant dependence, uncomplicated: Secondary | ICD-10-CM

## 2017-05-22 LAB — LIPID PANEL
Cholesterol: 178 mg/dL (ref 0–200)
HDL: 47 mg/dL (ref >40)
LDL CALC: 116 mg/dL — AB (ref 0–99)
TRIGLYCERIDES: 75 mg/dL (ref <150)
Total CHOL/HDL Ratio: 3.8 RATIO
VLDL: 15 mg/dL (ref 0–40)

## 2017-05-22 LAB — HEMOGLOBIN A1C
Hgb A1c MFr Bld: 5 % (ref 4.8–5.6)
Mean Plasma Glucose: 96.8 mg/dL

## 2017-05-22 LAB — TSH: TSH: 0.628 u[IU]/mL (ref 0.350–4.500)

## 2017-05-22 MED ORDER — HYDROXYZINE HCL 50 MG PO TABS
50.0000 mg | ORAL_TABLET | Freq: Four times a day (QID) | ORAL | Status: DC | PRN
  Administered 2017-05-22 – 2017-05-25 (×5): 50 mg via ORAL
  Filled 2017-05-22 (×6): qty 1

## 2017-05-22 MED ORDER — RISPERIDONE 1 MG PO TABS
1.0000 mg | ORAL_TABLET | Freq: Every day | ORAL | Status: DC
  Administered 2017-05-22: 1 mg via ORAL
  Filled 2017-05-22 (×3): qty 1

## 2017-05-22 NOTE — Progress Notes (Addendum)
D:  Patient denied SI and HI, contracts for safety.  Denied A/V hallucinations.  A:  Medictitons administered per MD orders.  Emotional support and encouragement given patient. R:   Safety maintained with 15 minute checks.
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
## 2017-05-22 NOTE — Plan of Care (Signed)
Nurse discussed depression, anxiety;, coping skills with patient.

## 2017-05-22 NOTE — Progress Notes (Signed)
Pt did not attend evening wrap up group, remained in room sleep.

## 2017-05-22 NOTE — BHH Group Notes (Signed)
[place] Group Notes:  (Nursing/MHT/Case Management/Adjunct)  Date:  05/22/2017  Time:  4:00 p.m.  Type of Therapy:  Psychoeducational Skills  Participation Level:  Active  Participation Quality:  Appropriate  Affect:  Appropriate  Cognitive:  Appropriate  Insight:  Appropriate  Engagement in Group:  Supportive  Modes of Intervention:  Support  Summary of Progress/Problems:  Patient contributed to discussion appropriately.  John Robbins [place] 05/22/2017, 6:04 PM

## 2017-05-22 NOTE — Progress Notes (Signed)
Patient ID: John Robbins, male   DOB: Jan 13, 1976, 42 y.o.   MRN: [id number]  Pt currently presents with an anxious affect and impulsive, anxious behavior. Pt reports to writer that he is "really shaky and anxious." Pt reports sudden onset tonight. Pt remains in bed, reports that being around others "makes it worse." Refuses to go to group. Pt requests anxiety medication, feels the Neurontin had opposite effect. Patient shaking and tense upon first interaction, calm and not shaking when given medication. Pt reports "okay" sleep with current medication regimen.   Pt provided with scheduled and as needed medications per providers orders. Pt's labs and vitals were monitored throughout the night. Pt given a 1:1 about emotional and mental status. Pt supported and encouraged to express concerns and questions. Pt educated on alternative stress relief techniques, needs further education.   Pt's safety ensured with 15 minute and environmental checks. Pt currently denies SI/HI and A/V hallucinations. Pt has a history of AH upon arrival to [facility]. Pt verbally agrees to seek staff if SI/HI or A/VH occurs and to consult with staff before acting on any harmful thoughts. Will continue POC.
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
## 2017-05-22 NOTE — Progress Notes (Signed)
Recreation Therapy Notes  Date: 05/22/17 Time: 0930 Location: 300 Hall Dayroom  Group Topic: Stress Management  Goal Area(s) Addresses:  Patient will verbalize importance of using healthy stress management.  Patient will identify positive emotions associated with healthy stress management.   Intervention: Stress Management  Activity :  Meditation.  LRT introduced the stress management technique of meditation.  LRT played a meditation that allowed patients to focus on finding forgiveness for people who may have wronged them.  Education:  Stress Management, Discharge Planning.   Education Outcome: Acknowledges edcuation/In group clarification offered/Needs additional education  Clinical Observations/Feedback: Pt did not attend group.    Victorino Sparrow, LRT/CTRS         Ria Comment, Vennesa Bastedo A 05/22/2017 10:46 AM

## 2017-05-22 NOTE — Tx Team (Signed)
Interdisciplinary Treatment and Diagnostic Plan Update  05/22/2017 Time of Session: 0830AM John Robbins MRN: [id number]  Principal Diagnosis: Major Depressive Disorder, severe, with psychotic features  Secondary Diagnoses: Active Problems:   Severe recurrent major depression with psychotic features (HCC)   Current Medications:  Current Facility-Administered Medications  Medication Dose Route Frequency Provider Last Rate Last Dose  . abacavir-dolutegravir-lamiVUDine (TRIUMEQ) 600-50-300 MG per tablet 1 tablet  1 tablet Oral Daily Lindon Romp A, NP      . acetaminophen (TYLENOL) tablet 650 mg  650 mg Oral Q6H PRN Rozetta Nunnery, NP      . alum & mag hydroxide-simeth (MAALOX/MYLANTA) 200-200-20 MG/5ML suspension 30 mL  30 mL Oral Q4H PRN Lindon Romp A, NP      . fluPHENAZine (PROLIXIN) tablet 10 mg  10 mg Oral QHS PRN Lindon Romp A, NP      . gabapentin (NEURONTIN) capsule 300 mg  300 mg Oral BID Lindon Romp A, NP   300 mg at 05/21/17 2308  . magnesium hydroxide (MILK OF MAGNESIA) suspension 30 mL  30 mL Oral Daily PRN Lindon Romp A, NP      . traZODone (DESYREL) tablet 150 mg  150 mg Oral QHS Lindon Romp A, NP   150 mg at 05/21/17 2307   PTA Medications: Medications Prior to Admission  Medication Sig Dispense Refill Last Dose  . fluPHENAZine (PROLIXIN) 10 MG tablet TAKE 1 TABLET BY MOUTH AT BEDTIME AS NEEDED FOR HALLUCINATIONS 30 tablet 1 Past Month at Unknown time  . lurasidone (LATUDA) 80 MG TABS tablet Take 1 tablet (80 mg total) by mouth at bedtime. 90 tablet 1 Past Month at Unknown time  . traZODone (DESYREL) 150 MG tablet Take 1 tablet (150 mg total) by mouth at bedtime. 90 tablet 1 Past Month at Unknown time  . TRIUMEQ 600-50-300 MG tablet TAKE 1 TABLET BY MOUTH ONCE EVERY DAY 90 tablet 2 05/19/2017 at Unknown time    Patient Stressors: Health problems Medication change or noncompliance Substance abuse  Patient Strengths: Average or above average  intelligence Capable of independent living Motivation for treatment/growth  Treatment Modalities: Medication Management, Group therapy, Case management,  1 to 1 session with clinician, Psychoeducation, Recreational therapy.   Physician Treatment Plan for Primary Diagnosis: Major Depressive Disorder, severe, with psychotic features  Medication Management: Evaluate patient's response, side effects, and tolerance of medication regimen.  Therapeutic Interventions: 1 to 1 sessions, Unit Group sessions and Medication administration.  Evaluation of Outcomes: Not Met  Physician Treatment Plan for Secondary Diagnosis: Active Problems:   Severe recurrent major depression with psychotic features ([place])  Medication Management: Evaluate patient's response, side effects, and tolerance of medication regimen.  Therapeutic Interventions: 1 to 1 sessions, Unit Group sessions and Medication administration.  Evaluation of Outcomes: Adequate for discharge   RN Treatment Plan for Primary Diagnosis: Major Depressive Disorder, severe, with psychotic features Long Term Goal(s): Knowledge of disease and therapeutic regimen to maintain health will improve  Short Term Goals: Ability to remain free from injury will improve, Ability to verbalize frustration and anger appropriately will improve, Ability to disclose and discuss suicidal ideas and Ability to identify and develop effective coping behaviors will improve  Medication Management: RN will administer medications as ordered by provider, will assess and evaluate patient's response and provide education to patient for prescribed medication. RN will report any adverse and/or side effects to prescribing provider.  Therapeutic Interventions: 1 on 1 counseling sessions, Psychoeducation, Medication administration, Evaluate responses to treatment,  Monitor vital signs and CBGs as ordered, Perform/monitor CIWA, COWS, AIMS and Fall Risk screenings as ordered, Perform  wound care treatments as ordered.  Evaluation of Outcomes: Not Met   LCSW Treatment Plan for Primary Diagnosis: Major Depressive Disorder, severe, with psychotic features Long Term Goal(s): Safe transition to appropriate next level of care at discharge, Engage patient in therapeutic group addressing interpersonal concerns.  Short Term Goals: Engage patient in aftercare planning with referrals and resources, Facilitate patient progression through stages of change regarding substance use diagnoses and concerns and Identify triggers associated with mental health/substance abuse issues  Therapeutic Interventions: Assess for all discharge needs, 1 to 1 time with Social worker, Explore available resources and support systems, Assess for adequacy in community support network, Educate family and significant other(s) on suicide prevention, Complete Psychosocial Assessment, Interpersonal group therapy.  Evaluation of Outcomes: Adequate for discharge   Progress in Treatment: Attending groups: Yes Participating in groups: Yes Taking medication as prescribed: Yes. Toleration medication: Yes. Family/Significant other contact made: SPE completed with pt's mother. Collateral contact also obtained.  Patient understands diagnosis: Yes. Discussing patient identified problems/goals with staff: Yes. Medical problems stabilized or resolved: Yes. Denies suicidal/homicidal ideation: Yes. Issues/concerns per patient self-inventory: No. Other: n/a   New problem(s) identified: No, Describe:  n/a  New Short Term/Long Term Goal(s): detox, elimination of AVH, medication management for mood stabilization; elimination of SI thoughts; development of comprehensive mental wellness/sobriety plan.   Patient Goal: "to get sober and learn better coping skills."   Discharge Plan or Barriers: pt appt with Dr. Daron Offer rescheduled for next week. Pt was referred to mood treatment center for individual therapy. [place] pamphlet and  AA/NA information provided for additional community support. Pt was not accepted into PHP.   Reason for Continuation of Hospitalization: none  Estimated Length of Stay: Friday, 05/26/17  Attendees: Patient:  05/22/2017 7:49 AM  Physician: Dr. Sanjuana Letters MD; Dr. Nancy Fetter MD 05/22/2017 7:49 AM  Nursing: Kieth Brightly RN; Rise Paganini RN 05/22/2017 7:49 AM  RN Care Manager:x 05/22/2017 7:49 AM  Social Worker: [place], LCSW 05/22/2017 7:49 AM  Recreational Therapist: x 05/22/2017 7:49 AM  Other: Lindell Spar NP; Darnelle Maffucci Money NP 05/22/2017 7:49 AM  Other:  05/22/2017 7:49 AM  Other: 05/22/2017 7:49 AM    Scribe for Treatment Team: [place], LCSW 05/22/2017 7:49 AM
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
## 2017-05-22 NOTE — BHH Suicide Risk Assessment (Signed)
[place] INPATIENT:  Family/Significant Other Suicide Prevention Education  Suicide Prevention Education:  Education Completed; John Robbins (pt's mother) [phone] has been identified by the patient as the family member/significant other with whom the patient will be residing, and identified as the person(s) who will aid the patient in the event of a mental health crisis (suicidal ideations/suicide attempt).  With written consent from the patient, the family member/significant other has been provided the following suicide prevention education, prior to the and/or following the discharge of the patient.  The suicide prevention education provided includes the following:  Suicide risk factors  Suicide prevention and interventions  National Suicide Hotline telephone number  [facility] assessment telephone number  [facility] Emergency Assistance [place] and/or Residential Mobile Crisis Unit telephone number  Request made of family/significant other to:  Remove weapons (e.g., guns, rifles, knives), all items previously/currently identified as safety concern.    Remove drugs/medications (over-the-counter, prescriptions, illicit drugs), all items previously/currently identified as a safety concern.  The family member/significant other verbalizes understanding of the suicide prevention education information provided.  The family member/significant other agrees to remove the items of safety concern listed above.  Pt's mother thought that pt would do well in [place], "He wants to focus more on the mental health side of things, now that he's completed CDIOP." Aftercare plan and SPE reviewed with pt's mother. Pt's mother agreeable to staying with patient for several days after his discharge to provide emotional support when he returns home. Pt has no access to guns/weapons.   John Robbins N Smart LCSW 05/22/2017, 1:00 PM

## 2017-05-23 DIAGNOSIS — G4700 Insomnia, unspecified: Secondary | ICD-10-CM

## 2017-05-23 DIAGNOSIS — Z21 Asymptomatic human immunodeficiency virus [HIV] infection status: Secondary | ICD-10-CM

## 2017-05-23 MED ORDER — GABAPENTIN 300 MG PO CAPS
300.0000 mg | ORAL_CAPSULE | Freq: Three times a day (TID) | ORAL | Status: DC
  Administered 2017-05-23 – 2017-05-26 (×10): 300 mg via ORAL
  Filled 2017-05-23 (×12): qty 1

## 2017-05-23 MED ORDER — FLUTICASONE PROPIONATE 50 MCG/ACT NA SUSP
1.0000 | Freq: Two times a day (BID) | NASAL | Status: DC
  Administered 2017-05-23 – 2017-05-26 (×7): 1 via NASAL
  Filled 2017-05-23: qty 16

## 2017-05-23 MED ORDER — RISPERIDONE 2 MG PO TABS
2.0000 mg | ORAL_TABLET | Freq: Every day | ORAL | Status: DC
  Administered 2017-05-23 – 2017-05-25 (×3): 2 mg via ORAL
  Filled 2017-05-23 (×4): qty 1

## 2017-05-23 MED ORDER — AMOXICILLIN-POT CLAVULANATE 875-125 MG PO TABS
1.0000 | ORAL_TABLET | Freq: Two times a day (BID) | ORAL | Status: DC
  Administered 2017-05-23 – 2017-05-26 (×7): 1 via ORAL
  Filled 2017-05-23 (×9): qty 1

## 2017-05-23 MED ORDER — GABAPENTIN 300 MG PO CAPS: 300.0000 mg | ORAL_CAPSULE | Freq: Two times a day (BID) | ORAL | Status: DC

## 2017-05-23 MED ORDER — TRAZODONE HCL 150 MG PO TABS
150.0000 mg | ORAL_TABLET | Freq: Every day | ORAL | Status: DC
Start: 2017-05-23 — End: 2017-05-26
  Administered 2017-05-23 – 2017-05-25 (×3): 150 mg via ORAL
  Filled 2017-05-23 (×4): qty 1

## 2017-05-23 NOTE — BHH Group Notes (Signed)
Adult Psychoeducational Group Note  Date:  05/23/2017 Time:  7:13 PM  Group Topic/Focus:  Activity  Participation Level:  Did Not Attend     John Robbins 05/23/2017, 7:13 PM

## 2017-05-23 NOTE — BHH Group Notes (Signed)
[facility] Mental [facility] Association Group Therapy 05/23/2017 1:15pm  Type of Therapy: Mental Health Association Presentation  Participation Level: Active  Participation Quality: Attentive  Affect: Appropriate  Cognitive: Oriented  Insight: Developing/Improving  Engagement in Therapy: Engaged  Modes of Intervention: Discussion, Education and Socialization  Summary of Progress/Problems: John (Ruby) Speaker came to talk about his personal journey with mental health. The pt processed ways by which to relate to the speaker. [place] speaker provided handouts and educational information pertaining to groups and services offered by the [facility]. Pt was engaged in speaker's presentation and was receptive to resources provided.    [employer], LCSW 05/23/2017 3:55 PM

## 2017-05-23 NOTE — Plan of Care (Signed)
Nurse discussed depression, anxiety, coping skills with patient.

## 2017-05-23 NOTE — Progress Notes (Signed)
[facility] MD Progress Note  05/23/2017 4:24 PM John Robbins  MRN:  [id number]  Subjective: John Robbins reports, "My mood is pretty low today. I feel like coming to this hospital is a waste of time. There are a lot of patients here. When I tried to stay in the day room, all I hear from these patients is how much drugs & all kinds of drugs they were doing out there. All that is doing to me is to make me feel like using again & thinking about drugs the same time. The voices are still there today. I think I have sinus infection. A lot of pressure under my eyes, drainage that is thick, yellow/green in color".  Objective: John Robbins is a 42 y/o M with history of MDD, methamphetamine use disorder, and HIV who was admitted voluntarily with worsening depression, SI without plan, AH, and illicit substance use of methamphetamine. Upon initial interview, pt states, "I have an addiction issue and an issue with voices." Pt reports that he has struggled with AH for the past 18 months and his symptoms appear to be separate from his substance use methamphetamine. Today, 05-23-17,  John Robbins is seen, chart reviewed. The chart findings discussed with the treatment team. He is alert, oriented x 4. He is visible on the unit. He is attending group sessions. He has quite few complaints today about his mental & medical health. He reports ongoing auditory hallucinations. He is complaining of anxiety & his mood being low today. He is wondering if he made the right decision for being in the hospital as all he his hearing from fellow patients is about how much drugs they were using out there on the streets. He says this is not really helping him as it is making him crave & think about doing drugs. He is encouraged to focus on his need for mental health treatment/stabilization & the need to stay sober & maintain sobriety. He is in agreement to upgrade his Risperdal to 2 mg Q hs, Gabapentin 300 mg to tid. The dosing times for his night  medications were all scheduled at 2100. He is started on Augmentin for sinus infection & Flonase for sinus congestion. Encouragement & support provided.  Principal Problem: Severe recurrent major depression with psychotic features [facility])  Diagnosis:   Patient Active Problem List   Diagnosis Date Noted  . Schizoaffective disorder, bipolar type ([place]) [F25.0] 02/07/2017    Priority: High  . Amphetamine use disorder, severe ([place]) [F15.20] 02/07/2017    Priority: High  . Severe recurrent major depression with psychotic features ([place]) [F33.3] 05/21/2017  . Methamphetamine abuse ([place]) [F15.10] 05/20/2017  . Attention deficit hyperactivity disorder (ADHD) [F90.9] 02/08/2017  . PTSD (post-traumatic stress disorder) [F43.10] 02/08/2017  . Auditory hallucination [R44.0] 02/07/2017  . Substance-induced psychotic disorder ([place]) [F19.959] 02/07/2017  . Otosclerosis of left ear [H80.92] 11/04/2016  . Conductive hearing loss in left ear [H90.12] 10/21/2016  . Tinnitus of both ears [H93.13] 09/20/2016  . Raynaud's disease without gangrene [I73.00] 09/20/2016  . Polyarthralgia [M25.50] 09/20/2016  . Venereal disease contact [Z20.2] 09/20/2016  . Back pain [M54.9] 08/26/2016  . Bilateral hand swelling [M79.89] 08/26/2016  . Screen for STD (sexually transmitted disease) [Z11.3] 08/26/2016  . Fatigue [R53.83] 08/08/2016  . Rash and nonspecific skin eruption [R21] 08/03/2016  . Anxiety and depression [F41.9, F32.9] 07/01/2016  . Human immunodeficiency virus (HIV) disease ([place]) [B20] 02/26/2015  . Encounter for long-term (current) use of medications [Z79.899] 02/26/2015  . Celiac disease [K90.0] 02/26/2015  .  History of seizure [Z87.898] 12/25/2013  . History of rectal bleeding [Z87.19] 12/25/2013  . Hepatitis B core antibody positive [R76.8] 12/25/2013   Total Time spent with patient: 30 minutes  Past Psychiatric History: See H&P  Past Medical History:  Past Medical History:  Diagnosis Date  .  Allergy   . Anxiety   . Celiac disease   . History of substance abuse   . HIV infection ([place])   . Raynaud disease   . Seizures ([place])     Past Surgical History:  Procedure Laterality Date  . WISDOM TOOTH EXTRACTION     Family History:  Family History  Problem Relation Age of Onset  . Heart disease Father 59  . Hypertension Father   . Heart disease Maternal Grandfather        early 35s  . Hypertension Maternal Grandfather   . Hypertension Mother   . Rheum arthritis Mother   . Crohn's disease Mother   . Crohn's disease Sister   . Rheum arthritis Maternal Grandmother   . Breast cancer Maternal Grandmother   . Colon cancer Paternal Grandmother   . Hypertension Paternal Grandfather   . Esophageal cancer Neg Hx   . Stomach cancer Neg Hx    Family Psychiatric  History: See H&P. Social History:  Social History   Substance and Sexual Activity  Alcohol Use Yes  . Alcohol/week: 1.2 oz  . Types: 2 Glasses of wine per week   Comment: occ     Social History   Substance and Sexual Activity  Drug Use Yes  . Types: Methamphetamines   Comment: Hx of maijuana, ecstacy, GHB, ketamine, amephetamines, cocaine - nothing since 2009    Social History   Socioeconomic History  . Marital status: Unknown    Spouse name: None  . Number of children: None  . Years of education: None  . Highest education level: None  Social Needs  . Financial resource strain: None  . Food insecurity - worry: None  . Food insecurity - inability: None  . Transportation needs - medical: None  . Transportation needs - non-medical: None  Occupational History  . None  Tobacco Use  . Smoking status: Never Smoker  . Smokeless tobacco: Never Used  Substance and Sexual Activity  . Alcohol use: Yes    Alcohol/week: 1.2 oz    Types: 2 Glasses of wine per week    Comment: occ  . Drug use: Yes    Types: Methamphetamines    Comment: Hx of maijuana, ecstacy, GHB, ketamine, amephetamines, cocaine - nothing  since 2009  . Sexual activity: Yes    Partners: Male    Comment: Uses Condoms  Other Topics Concern  . None  Social History Narrative   Works in Engineer, technical sales.   Additional Social History:   Sleep: Fair  Appetite:  Good  Current Medications: Current Facility-Administered Medications  Medication Dose Route Frequency Provider Last Rate Last Dose  . abacavir-dolutegravir-lamiVUDine (TRIUMEQ) 528-41-324 MG per tablet 1 tablet  1 tablet Oral Daily Lindon Romp A, NP   1 tablet at 05/23/17 0818  . acetaminophen (TYLENOL) tablet 650 mg  650 mg Oral Q6H PRN Lindon Romp A, NP      . alum & mag hydroxide-simeth (MAALOX/MYLANTA) 200-200-20 MG/5ML suspension 30 mL  30 mL Oral Q4H PRN Lindon Romp A, NP      . amoxicillin-clavulanate (AUGMENTIN) 875-125 MG per tablet 1 tablet  1 tablet Oral Q12H Lindell Spar I, NP   1 tablet at 05/23/17  1400  . fluticasone (FLONASE) 50 MCG/ACT nasal spray 1 spray  1 spray Each Nare BID Lindell Spar I, NP   1 spray at 05/23/17 1401  . gabapentin (NEURONTIN) capsule 300 mg  300 mg Oral TID Lindell Spar I, NP   300 mg at 05/23/17 1623  . hydrOXYzine (ATARAX/VISTARIL) tablet 50 mg  50 mg Oral Q6H PRN Lindell Spar I, NP   50 mg at 05/22/17 2038  . magnesium hydroxide (MILK OF MAGNESIA) suspension 30 mL  30 mL Oral Daily PRN Lindon Romp A, NP      . risperiDONE (RISPERDAL) tablet 2 mg  2 mg Oral QHS Nwoko, Agnes I, NP      . traZODone (DESYREL) tablet 150 mg  150 mg Oral QHS Lindell Spar I, NP       Lab Results:  Results for orders placed or performed during the hospital encounter of 05/21/17 (from the past 48 hour(s))  Hemoglobin A1c     Status: None   Collection Time: 05/22/17  6:29 AM  Result Value Ref Range   Hgb A1c MFr Bld 5.0 4.8 - 5.6 %    Comment: (NOTE) Pre diabetes:          5.7%-6.4% Diabetes:              >6.4% Glycemic control for   <7.0% adults with diabetes    Mean Plasma Glucose 96.8 mg/dL    Comment: Performed at [facility], [place] [address], [place], [place] [address]  Lipid panel     Status: Abnormal   Collection Time: 05/22/17  6:29 AM  Result Value Ref Range   Cholesterol 178 0 - 200 mg/dL   Triglycerides 75 <150 mg/dL   HDL 47 >40 mg/dL   Total CHOL/HDL Ratio 3.8 RATIO   VLDL 15 0 - 40 mg/dL   LDL Cholesterol 116 (H) 0 - 99 mg/dL    Comment:        Total Cholesterol/HDL:CHD Risk Coronary Heart Disease Risk Table                     Men   Women  1/2 Average Risk   3.4   3.3  Average Risk       5.0   4.4  2 X Average Risk   9.6   7.1  3 X Average Risk  23.4   11.0        Use the calculated Patient Ratio above and the CHD Risk Table to determine the patient's CHD Risk.        ATP III CLASSIFICATION (LDL):  <100     mg/dL   Optimal  100-129  mg/dL   Near or Above                    Optimal  130-159  mg/dL   Borderline  160-189  mg/dL   High  >190     mg/dL   Very High Performed at [facility] [address] [place], [place] [address]   TSH     Status: None   Collection Time: 05/22/17  6:29 AM  Result Value Ref Range   TSH 0.628 0.350 - 4.500 uIU/mL    Comment: Performed by a 3rd Generation assay with a functional sensitivity of <=0.01 uIU/mL. Performed at [facility], [place] [address] [place], [place] [address]    Blood Alcohol level:  Lab Results  Component Value Date  ETH <10 05/19/2017   ETH <10 61/95/0932   Metabolic Disorder Labs: Lab Results  Component Value Date   HGBA1C 5.0 05/22/2017   MPG 96.8 05/22/2017   MPG 96.8 02/08/2017   No results found for: PROLACTIN Lab Results  Component Value Date   CHOL 178 05/22/2017   TRIG 75 05/22/2017   HDL 47 05/22/2017   CHOLHDL 3.8 05/22/2017   VLDL 15 05/22/2017   LDLCALC 116 (H) 05/22/2017   LDLCALC 96 02/08/2017    Physical Findings: AIMS: Facial and Oral Movements Muscles of Facial Expression: None, normal Lips and Perioral Area: None, normal Jaw: None, normal Tongue: None, normal,Extremity  Movements Upper (arms, wrists, hands, fingers): None, normal Lower (legs, knees, ankles, toes): None, normal, Trunk Movements Neck, shoulders, hips: None, normal, Overall Severity Severity of abnormal movements (highest score from questions above): None, normal Incapacitation due to abnormal movements: None, normal Patient's awareness of abnormal movements (rate only patient's report): No Awareness, Dental Status Current problems with teeth and/or dentures?: No Does patient usually wear dentures?: No  CIWA:  CIWA-Ar Total: 1 COWS:  COWS Total Score: 2  Musculoskeletal: Strength & Muscle Tone: within normal limits Gait & Station: normal Patient leans: N/A  Psychiatric Specialty Exam: Physical Exam  Nursing note and vitals reviewed.   Review of Systems  Psychiatric/Behavioral: Positive for depression, hallucinations (Auditoy hallucinations, active) and substance abuse (Hx. Amphetamin use disorder). Negative for memory loss and suicidal ideas. The patient is nervous/anxious and has insomnia.     Blood pressure 105/74, pulse 95, temperature 97.6 F (36.4 C), resp. rate 16, height 6' (1.829 m), weight 68 kg (150 lb).Body mass index is 20.34 kg/m.  General Appearance: Casual and Fairly Groomed  Eye Contact:  Good  Speech:  Clear and Coherent  Volume:  Normal  Mood:  Anxious and Depressed  Affect:  Appropriate, Congruent and Constricted  Thought Process:  Coherent and Goal Directed  Orientation:  Full (Time, Place, and Person)  Thought Content:  Hallucinations: Auditory  Suicidal Thoughts:  Yes.  without intent/plan  Homicidal Thoughts:  No  Memory:  Immediate;   Fair Recent;   Fair Remote;   Fair  Judgement:  Impaired  Insight:  Fair  Psychomotor Activity:  Normal  Concentration:  Concentration: Fair  Recall:  [employer] of Knowledge:  Fair  Language:  Fair  Akathisia:  No  Handed:    AIMS (if indicated):     Assets:  Communication Skills Desire for  Improvement Resilience Social Support  ADL's:  Intact  Cognition:  WNL  Sleep:  Number of Hours: 6.75     Treatment Plan Summary: Daily contact with patient to assess and evaluate symptoms and progress in treatment: Patient is not at his baseline level of function. He continues to complain of mood instability & AH.      - Continue inpatient hospitalization.        - Will continue today 05/23/2017 plan as below except where it is noted.   Mood instability/AH.     - Increased Risperdal from 1 mg po Q hs to 2 mg po Q hs.  Agitation/anxiety.     - Increased gabapentin 300 mg from po bid to Gabapentin 300 mg po tid.     - Continue Hydroxyzine 50 mg po qid prn.  Insomnia.     - Continue Trazodone 150 mg po Q hs.  HIV infection.     - Continue TRIUMEQ 600-50-300 mg po daily.  Sinus infection/congestion.     -  Initiated Augmentin 875-125 mg po bid x 7 days.     - Initiated Flonase Nasal spray 50 MCG/ACT 1 spay bid.       - Patient to continue attend & participate in the group sessions.       - Discharge disposition plan ongoing.  Lindell Spar, NP, PMHNP, FNP-BC. 05/23/2017, 4:24 PM

## 2017-05-23 NOTE — B[facility] Group Notes (Signed)
[facility] Group Notes:  (Nursing/MHT/Case Management/Adjunct)  Date:  05/23/2017  Time:  3:30 p.m.  Type of Therapy:  Psychoeducational Skills  Participation Level:  Active  Participation Quality:  Appropriate  Affect:  Appropriate  Cognitive:  Appropriate  Insight:  Appropriate  Engagement in Group:  Engaged and Supportive  Modes of Intervention:  Activity and Education  Summary of Progress/Problems:  This is a psychoeducational group especially related to the therapy ball.   John Robbins 05/23/2017, 5:02 PM

## 2017-05-23 NOTE — Progress Notes (Signed)
Recreation Therapy Notes  Animal-Assisted Activity (AAA) Program Checklist/Progress Notes Patient Eligibility Criteria Checklist & Daily Group note for Rec TxIntervention  Date: 2.12.19 Time: 2:45 pm  Location: [address]   AAA/T Program Assumption of Risk Form signed by Patient/ or Parent Legal Guardian Yes  Patient is free of allergies or sever asthma Yes  Patient reports no fear of animals Yes  Patient reports no history of cruelty to animals Yes  Patient understands his/her participation is voluntary Yes  Behavioral Response: Did not attend   Ranell Patrick, Recreation Therapy Intern  Ranell Patrick 05/23/2017 8:39 AM

## 2017-05-23 NOTE — Progress Notes (Signed)
D:  Patient denied SI and HI, contracts for safety.   Denied A/V hallucinations.  Denied pain. A:  Medications administered per MD orders.  Emotional support and encouragement given patient. R:  Safety maintained with 15 minute checks. Patient stayed in bed most of the day.  Patient did walk to dining room for lunch and then returned to bed.

## 2017-05-24 MED ORDER — PSEUDOEPHEDRINE HCL ER 120 MG PO TB12
120.0000 mg | ORAL_TABLET | Freq: Two times a day (BID) | ORAL | Status: DC | PRN
  Administered 2017-05-24 – 2017-05-25 (×2): 120 mg via ORAL
  Filled 2017-05-24 (×2): qty 1

## 2017-05-24 NOTE — Progress Notes (Signed)
Adult Psychoeducational Group Note  Date:  05/24/2017 Time:  1:41 PM  Group Topic/Focus:  Wellness Toolbox:   The focus of this group is to discuss various aspects of wellness, balancing those aspects and exploring ways to increase the ability to experience wellness.  Patients will create a wellness toolbox for use upon discharge.  Participation Level:  Active  Participation Quality:  Appropriate  Affect:  Appropriate  Cognitive:  Alert and Appropriate  Insight: Appropriate, Good and Improving  Engagement in Group:  Engaged  Modes of Intervention:  Activity and Discussion  Additional Comments:  Pt participated in both activity and discussion today.  Francille Wittmann R Sarye Kath 05/24/2017, 1:41 PM

## 2017-05-24 NOTE — Progress Notes (Signed)
Patient did attend the evening speaker NA meeting.

## 2017-05-24 NOTE — Progress Notes (Signed)
Data. Patient denies SI/HI/AVH. Verbally contracts for safety on the unit and to come to staff before acting of any self harm thoughts/feelings.  Patient interacting well with staff and other patients. On his self assessment he reported 0/10 for depression, anxiety and hopelessness. His goal is, "Discharge plan." Action. Emotional support and encouragement offered. Education provided on medication, indications and side effect. Q 15 minute checks done for safety. Response. Safety on the unit maintained through 15 minute checks.  Medications taken as prescribed. Attended groups. Remained calm and appropriate through out shift.

## 2017-05-24 NOTE — BHH Group Notes (Signed)
Pt was alert and engaged in therapeutic television topic on vulnerability.

## 2017-05-24 NOTE — Progress Notes (Signed)
Pt has been observed in the dayroom most of the evening.  He attended evening AA group.  He denies SI/HI/AVH at this time.  He voices no needs or concerns to Probation officer.  He says he is feeling better tonight than in previous nights.  He was encouraged to make his needs known to staff.  He requested a Vistaril prn with his scheduled bedtime meds which he was given.  Support and encouragement offered.  Discharge plans are in process.  Safety maintained with q15 minute checks.

## 2017-05-24 NOTE — BHH Group Notes (Signed)
LCSW Group Therapy Note   05/24/2017 1:15pm   Type of Therapy and Topic:  Group Therapy:  Overcoming Obstacles   Participation Level:  Minimal   Description of Group:    In this group patients will be encouraged to explore what they see as obstacles to their own wellness and recovery. They will be guided to discuss their thoughts, feelings, and behaviors related to these obstacles. The group will process together ways to cope with barriers, with attention given to specific choices patients can make. Each patient will be challenged to identify changes they are motivated to make in order to overcome their obstacles. This group will be process-oriented, with patients participating in exploration of their own experiences as well as giving and receiving support and challenge from other group members.   Therapeutic Goals: 1. Patient will identify personal and current obstacles as they relate to admission. 2. Patient will identify barriers that currently interfere with their wellness or overcoming obstacles.  3. Patient will identify feelings, thought process and behaviors related to these barriers. 4. Patient will identify two changes they are willing to make to overcome these obstacles:      Summary of Patient Progress  John Robbins was attentive during group and actively listened as others shared but did not actively participate in group discussion unless prompted by CSW. He shared that his biggest obstacle is dealing with auditory hallucinations and depression. He was pleased to learn that he has been referred to a therapist at [facility] and will resume medication management services with his current psychiatrist at [facility]. He continues to demonstrate limited insight with some progress in the group setting.     Therapeutic Modalities:   Cognitive Behavioral Therapy Solution Focused Therapy Motivational Interviewing Relapse Prevention Therapy  Kimber Relic Smart, LCSW 05/24/2017  12:42 PM

## 2017-05-24 NOTE — Progress Notes (Signed)
Recreation Therapy Notes  Date: 05/24/17 Time: 0930 Location: [address] Dayroom  Group Topic: Stress Management  Goal Area(s) Addresses:  Patient will verbalize importance of using healthy stress management.  Patient will identify positive emotions associated with healthy stress management.   Behavioral Response: Engaged  Intervention: Stress Management  Activity :  LRT introduced the stress management technique of guided imagery.  LRT read a script to guide patients to envision their peaceful place. Patients were to follow along as LRT read script.  Education:  Stress Management, Discharge Planning.   Education Outcome: Acknowledges edcuation/In group clarification offered/Needs additional education  Clinical Observations/Feedback: Pt attended group.    Victorino Sparrow, LRT/CTRS         Ria Comment, Bereket Gernert A 05/24/2017 11:28 AM

## 2017-05-24 NOTE — Progress Notes (Signed)
[facility] MD Progress Note  05/24/2017 4:11 PM John Robbins  MRN:  [id number] Subjective:    John Robbins is a 42 y/o M with history of MDD, methamphetamine use disorder, and HIV who was admitted voluntarily with worsening depression, SI without plan, AH, and illicit substance use of methamphetamine. He was started on risperdal to address AH and gabapentin for anxiety, and both doses were titrated up during his stay. He has been reporting incremental improvement of his presenting symptoms.  Today upon evaluation, pt shares, "I'm doing okay - just still congested." He reports that his mood is improving overall. He denies SI/HI/AH/VH. He is sleeping well. His appetite is good. He feels that increased doses of his medications have been helpful, and he would like to continue them without changes. He would like something additional to help with congestion, and he agrees to trial of long-acting sudafed. Pt had no further questions, comments, or concerns.  Principal Problem: Severe recurrent major depression with psychotic features [facility]) Diagnosis:   Patient Active Problem List   Diagnosis Date Noted  . Severe recurrent major depression with psychotic features ([place]) [F33.3] 05/21/2017  . Methamphetamine abuse ([place]) [F15.10] 05/20/2017  . Attention deficit hyperactivity disorder (ADHD) [F90.9] 02/08/2017  . PTSD (post-traumatic stress disorder) [F43.10] 02/08/2017  . Auditory hallucination [R44.0] 02/07/2017  . Schizoaffective disorder, bipolar type ([place]) [F25.0] 02/07/2017  . Amphetamine use disorder, severe ([place]) [F15.20] 02/07/2017  . Substance-induced psychotic disorder ([place]) [F19.959] 02/07/2017  . Otosclerosis of left ear [H80.92] 11/04/2016  . Conductive hearing loss in left ear [H90.12] 10/21/2016  . Tinnitus of both ears [H93.13] 09/20/2016  . Raynaud's disease without gangrene [I73.00] 09/20/2016  . Polyarthralgia [M25.50] 09/20/2016  . Venereal disease contact [Z20.2] 09/20/2016  .  Back pain [M54.9] 08/26/2016  . Bilateral hand swelling [M79.89] 08/26/2016  . Screen for STD (sexually transmitted disease) [Z11.3] 08/26/2016  . Fatigue [R53.83] 08/08/2016  . Rash and nonspecific skin eruption [R21] 08/03/2016  . Anxiety and depression [F41.9, F32.9] 07/01/2016  . Human immunodeficiency virus (HIV) disease ([place]) [B20] 02/26/2015  . Encounter for long-term (current) use of medications [Z79.899] 02/26/2015  . Celiac disease [K90.0] 02/26/2015  . History of seizure [Z87.898] 12/25/2013  . History of rectal bleeding [Z87.19] 12/25/2013  . Hepatitis B core antibody positive [R76.8] 12/25/2013   Total Time spent with patient: 30 minutes  Past Psychiatric History: see H&P  Past Medical History:  Past Medical History:  Diagnosis Date  . Allergy   . Anxiety   . Celiac disease   . History of substance abuse   . HIV infection ([place])   . Raynaud disease   . Seizures ([place])     Past Surgical History:  Procedure Laterality Date  . WISDOM TOOTH EXTRACTION     Family History:  Family History  Problem Relation Age of Onset  . Heart disease Father 66  . Hypertension Father   . Heart disease Maternal Grandfather        early 40s  . Hypertension Maternal Grandfather   . Hypertension Mother   . Rheum arthritis Mother   . Crohn's disease Mother   . Crohn's disease Sister   . Rheum arthritis Maternal Grandmother   . Breast cancer Maternal Grandmother   . Colon cancer Paternal Grandmother   . Hypertension Paternal Grandfather   . Esophageal cancer Neg Hx   . Stomach cancer Neg Hx    Family Psychiatric  History: see H&P Social History:  Social History   Substance and Sexual  Activity  Alcohol Use Yes  . Alcohol/week: 1.2 oz  . Types: 2 Glasses of wine per week   Comment: occ     Social History   Substance and Sexual Activity  Drug Use Yes  . Types: Methamphetamines   Comment: Hx of maijuana, ecstacy, GHB, ketamine, amephetamines, cocaine - nothing since 2009     Social History   Socioeconomic History  . Marital status: Unknown    Spouse name: None  . Number of children: None  . Years of education: None  . Highest education level: None  Social Needs  . Financial resource strain: None  . Food insecurity - worry: None  . Food insecurity - inability: None  . Transportation needs - medical: None  . Transportation needs - non-medical: None  Occupational History  . None  Tobacco Use  . Smoking status: Never Smoker  . Smokeless tobacco: Never Used  Substance and Sexual Activity  . Alcohol use: Yes    Alcohol/week: 1.2 oz    Types: 2 Glasses of wine per week    Comment: occ  . Drug use: Yes    Types: Methamphetamines    Comment: Hx of maijuana, ecstacy, GHB, ketamine, amephetamines, cocaine - nothing since 2009  . Sexual activity: Yes    Partners: Male    Comment: Uses Condoms  Other Topics Concern  . None  Social History Narrative   Works in Engineer, technical sales.   Additional Social History:                         Sleep: Good  Appetite:  Fair  Current Medications: Current Facility-Administered Medications  Medication Dose Route Frequency Provider Last Rate Last Dose  . abacavir-dolutegravir-lamiVUDine (TRIUMEQ) 604-54-098 MG per tablet 1 tablet  1 tablet Oral Daily Lindon Romp A, NP   1 tablet at 05/24/17 [phone]  . acetaminophen (TYLENOL) tablet 650 mg  650 mg Oral Q6H PRN Lindon Romp A, NP      . alum & mag hydroxide-simeth (MAALOX/MYLANTA) 200-200-20 MG/5ML suspension 30 mL  30 mL Oral Q4H PRN Lindon Romp A, NP      . amoxicillin-clavulanate (AUGMENTIN) 875-125 MG per tablet 1 tablet  1 tablet Oral Q12H Lindell Spar I, NP   1 tablet at 05/24/17 [phone]  . fluticasone (FLONASE) 50 MCG/ACT nasal spray 1 spray  1 spray Each Nare BID Lindell Spar I, NP   1 spray at 05/24/17 [phone]  . gabapentin (NEURONTIN) capsule 300 mg  300 mg Oral TID Lindell Spar I, NP   300 mg at 05/24/17 1158  . hydrOXYzine (ATARAX/VISTARIL) tablet 50 mg  50 mg Oral  Q6H PRN Lindell Spar I, NP   50 mg at 05/23/17 2111  . magnesium hydroxide (MILK OF MAGNESIA) suspension 30 mL  30 mL Oral Daily PRN Lindon Romp A, NP      . pseudoephedrine (SUDAFED) 12 hr tablet 120 mg  120 mg Oral Q12H PRN Pennelope Bracken, MD      . risperiDONE (RISPERDAL) tablet 2 mg  2 mg Oral QHS Lindell Spar I, NP   2 mg at 05/23/17 2111  . traZODone (DESYREL) tablet 150 mg  150 mg Oral QHS Lindell Spar I, NP   150 mg at 05/23/17 2111    Lab Results: No results found for this or any previous visit (from the past 48 hour(s)).  Blood Alcohol level:  Lab Results  Component Value Date   ETH <10 05/19/2017   ETH <  10 40/98/1191    Metabolic Disorder Labs: Lab Results  Component Value Date   HGBA1C 5.0 05/22/2017   MPG 96.8 05/22/2017   MPG 96.8 02/08/2017   No results found for: PROLACTIN Lab Results  Component Value Date   CHOL 178 05/22/2017   TRIG 75 05/22/2017   HDL 47 05/22/2017   CHOLHDL 3.8 05/22/2017   VLDL 15 05/22/2017   LDLCALC 116 (H) 05/22/2017   LDLCALC 96 02/08/2017    Physical Findings: AIMS: Facial and Oral Movements Muscles of Facial Expression: None, normal Lips and Perioral Area: None, normal Jaw: None, normal Tongue: None, normal,Extremity Movements Upper (arms, wrists, hands, fingers): None, normal Lower (legs, knees, ankles, toes): None, normal, Trunk Movements Neck, shoulders, hips: None, normal, Overall Severity Severity of abnormal movements (highest score from questions above): None, normal Incapacitation due to abnormal movements: None, normal Patient's awareness of abnormal movements (rate only patient's report): No Awareness, Dental Status Current problems with teeth and/or dentures?: No Does patient usually wear dentures?: No  CIWA:  CIWA-Ar Total: 1 COWS:  COWS Total Score: 2  Musculoskeletal: Strength & Muscle Tone: within normal limits Gait & Station: normal Patient leans: N/A  Psychiatric Specialty Exam: Physical  Exam  Nursing note and vitals reviewed.   Review of Systems  Constitutional: Negative for chills and fever.  Respiratory: Negative for cough and shortness of breath.   Cardiovascular: Negative for chest pain.  Gastrointestinal: Negative for abdominal pain, heartburn, nausea and vomiting.  Psychiatric/Behavioral: Negative for depression, hallucinations and suicidal ideas. The patient is not nervous/anxious.     Blood pressure 106/77, pulse (!) 110, temperature 98.4 F (36.9 C), temperature source Oral, resp. rate 18, height 6' (1.829 m), weight 68 kg (150 lb).Body mass index is 20.34 kg/m.  General Appearance: Casual and Fairly Groomed  Eye Contact:  Good  Speech:  Clear and Coherent and Normal Rate  Volume:  Normal  Mood:  Anxious and Depressed  Affect:  Appropriate, Congruent and Constricted  Thought Process:  Coherent and Goal Directed  Orientation:  Full (Time, Place, and Person)  Thought Content:  Logical  Suicidal Thoughts:  No  Homicidal Thoughts:  No  Memory:  Immediate;   Fair Recent;   Fair Remote;   Fair  Judgement:  Fair  Insight:  Fair  Psychomotor Activity:  Normal  Concentration:  Concentration: Fair  Recall:  [employer] of Knowledge:  Fair  Language:  Good  Akathisia:  No  Handed:    AIMS (if indicated):     Assets:  Communication Skills Resilience Social Support  ADL's:  Intact  Cognition:  WNL  Sleep:  Number of Hours: 6.25    Treatment Plan Summary: Daily contact with patient to assess and evaluate symptoms and progress in treatment and Medication management. Pt is reporting incremental improvement of his presenting symptoms, and he notes that increased doses of gabapentin and risperdal have been helpful. He would like to continue his current regimen without changes today. He requests for additional decongestant, and he agrees to trial of sudafed.   - Continue inpatient hospitalization  - MDD with psychotic features             - Continue  risperdal 61m po qhs  -Anxiety             - Continue gabapentin 3075mpo TID             - Continue atarax 5056mo q6h prn anxiety  - Insomnia              -  Continue trazodone 143m po qhs  -HIV infection   - Continue Triumeq 600-50-3012mpo qDay  -Sinus infection   - Continue augmentin 875-12454mo BID x7 days   - Continue flonase 65m81mCT 1 spray per nostril BID   - Start sudafed (long-acting) 120mg13mBID prn congestion  -Encourage participation in groups and the therapeutic milieu  -Discharge planning will be ongoing    ChrisPennelope Bracken2/13/2019, 4:11 PM

## 2017-05-25 ENCOUNTER — Ambulatory Visit (HOSPITAL_COMMUNITY): Payer: 59 | Admitting: Psychiatry

## 2017-05-25 NOTE — Progress Notes (Signed)
Pt did attend the evening wrap up group and was attentive, supportive, and active in sharing. Pt shared that he had a great visit with his Corena Pilgrim today an the he is grateful he doesn't have to take sobriety on by himself. Pt did share that he was upset about how a patient was discharged earlier today that he thought needed more time to set of a supportive discharge plan. Pt realizes he may not know whole situation but was upsetting to think he may be without a place to go. Pt participated in sharing three loves for Valentine's day. Pt loves to eat cheesecake, loves to run, and loves to hear silence because pt shares he suffers from auditory hallucinations.

## 2017-05-25 NOTE — Progress Notes (Signed)
[facility] MD Progress Note  05/25/2017 2:09 PM John Robbins  MRN:  [id number]  Subjective: John Robbins reports, "I'm doing a lot better. I'm sleeping well. No depression or anxiety today. I have been attending group sessions. I would like my prescriptions upon discharge to be made for a 90 day supply so the insurance will pay for it".  John Robbins is a 42 y/o M with history of MDD, methamphetamine use disorder, and HIV who was admitted voluntarily with worsening depression, SI without plan, AH, and illicit substance use of methamphetamine. He was started on risperdal to address AH and gabapentin for anxiety, and both doses were titrated up during his stay. He has been reporting incremental improvement of his presenting symptoms.  Today upon evaluation, pt shares, "I'm doing okay. He reports that his mood is improving overall. He denies SI/HI/AH/VH. He is sleeping well. His appetite is good. He feels that increased doses of his medications have been helpful, and he would like to continue them without changes.  Pt had no further questions, comments, or concerns.  Principal Problem: Severe recurrent major depression with psychotic features John Robbins) Diagnosis:   Patient Active Problem List   Diagnosis Date Noted  . Schizoaffective disorder, bipolar type ([place]) [F25.0] 02/07/2017    Priority: High  . Amphetamine use disorder, severe ([place]) [F15.20] 02/07/2017    Priority: High  . Severe recurrent major depression with psychotic features ([place]) [F33.3] 05/21/2017  . Methamphetamine abuse ([place]) [F15.10] 05/20/2017  . Attention deficit hyperactivity disorder (ADHD) [F90.9] 02/08/2017  . PTSD (post-traumatic stress disorder) [F43.10] 02/08/2017  . Auditory hallucination [R44.0] 02/07/2017  . Substance-induced psychotic disorder ([place]) [F19.959] 02/07/2017  . Otosclerosis of left ear [H80.92] 11/04/2016  . Conductive hearing loss in left ear [H90.12] 10/21/2016  . Tinnitus of both ears [H93.13] 09/20/2016  .  Raynaud's disease without gangrene [I73.00] 09/20/2016  . Polyarthralgia [M25.50] 09/20/2016  . Venereal disease contact [Z20.2] 09/20/2016  . Back pain [M54.9] 08/26/2016  . Bilateral hand swelling [M79.89] 08/26/2016  . Screen for STD (sexually transmitted disease) [Z11.3] 08/26/2016  . Fatigue [R53.83] 08/08/2016  . Rash and nonspecific skin eruption [R21] 08/03/2016  . Anxiety and depression [F41.9, F32.9] 07/01/2016  . Human immunodeficiency virus (HIV) disease ([place]) [B20] 02/26/2015  . Encounter for long-term (current) use of medications [Z79.899] 02/26/2015  . Celiac disease [K90.0] 02/26/2015  . History of seizure [Z87.898] 12/25/2013  . History of rectal bleeding [Z87.19] 12/25/2013  . Hepatitis B core antibody positive [R76.8] 12/25/2013   Total Time spent with patient: 15 minutes  Past Psychiatric History: See H&P  Past Medical History:  Past Medical History:  Diagnosis Date  . Allergy   . Anxiety   . Celiac disease   . History of substance abuse   . HIV infection ([place])   . Raynaud disease   . Seizures ([place])     Past Surgical History:  Procedure Laterality Date  . WISDOM TOOTH EXTRACTION     Family History:  Family History  Problem Relation Age of Onset  . Heart disease Father 53  . Hypertension Father   . Heart disease Maternal Grandfather        early 65s  . Hypertension Maternal Grandfather   . Hypertension Mother   . Rheum arthritis Mother   . Crohn's disease Mother   . Crohn's disease Sister   . Rheum arthritis Maternal Grandmother   . Breast cancer Maternal Grandmother   . Colon cancer Paternal Grandmother   . Hypertension Paternal Grandfather   .  Esophageal cancer Neg Hx   . Stomach cancer Neg Hx    Family Psychiatric  History: See H&P Social History:  Social History   Substance and Sexual Activity  Alcohol Use Yes  . Alcohol/week: 1.2 oz  . Types: 2 Glasses of wine per week   Comment: occ     Social History   Substance and Sexual  Activity  Drug Use Yes  . Types: Methamphetamines   Comment: Hx of maijuana, ecstacy, GHB, ketamine, amephetamines, cocaine - nothing since 2009    Social History   Socioeconomic History  . Marital status: Unknown    Spouse name: None  . Number of children: None  . Years of education: None  . Highest education level: None  Social Needs  . Financial resource strain: None  . Food insecurity - worry: None  . Food insecurity - inability: None  . Transportation needs - medical: None  . Transportation needs - non-medical: None  Occupational History  . None  Tobacco Use  . Smoking status: Never Smoker  . Smokeless tobacco: Never Used  Substance and Sexual Activity  . Alcohol use: Yes    Alcohol/week: 1.2 oz    Types: 2 Glasses of wine per week    Comment: occ  . Drug use: Yes    Types: Methamphetamines    Comment: Hx of maijuana, ecstacy, GHB, ketamine, amephetamines, cocaine - nothing since 2009  . Sexual activity: Yes    Partners: Male    Comment: Uses Condoms  Other Topics Concern  . None  Social History Narrative   Works in Engineer, technical sales.   Additional Social History:   Sleep: Good  Appetite:  Fair  Current Medications: Current Facility-Administered Medications  Medication Dose Route Frequency Provider Last Rate Last Dose  . abacavir-dolutegravir-lamiVUDine (TRIUMEQ) 400-86-761 MG per tablet 1 tablet  1 tablet Oral Daily Lindon Romp A, NP   1 tablet at 05/25/17 0839  . acetaminophen (TYLENOL) tablet 650 mg  650 mg Oral Q6H PRN Lindon Romp A, NP      . alum & mag hydroxide-simeth (MAALOX/MYLANTA) 200-200-20 MG/5ML suspension 30 mL  30 mL Oral Q4H PRN Lindon Romp A, NP      . amoxicillin-clavulanate (AUGMENTIN) 875-125 MG per tablet 1 tablet  1 tablet Oral Q12H John Robbins I, NP   1 tablet at 05/25/17 0839  . fluticasone (FLONASE) 50 MCG/ACT nasal spray 1 spray  1 spray Each Nare BID John Robbins I, NP   1 spray at 05/25/17 [phone]  . gabapentin (NEURONTIN) capsule 300 mg  300  mg Oral TID John Robbins I, NP   300 mg at 05/25/17 1215  . hydrOXYzine (ATARAX/VISTARIL) tablet 50 mg  50 mg Oral Q6H PRN John Robbins I, NP   50 mg at 05/24/17 1818  . magnesium hydroxide (MILK OF MAGNESIA) suspension 30 mL  30 mL Oral Daily PRN Lindon Romp A, NP      . pseudoephedrine (SUDAFED) 12 hr tablet 120 mg  120 mg Oral Q12H PRN Pennelope Bracken, MD   120 mg at 05/25/17 0842  . risperiDONE (RISPERDAL) tablet 2 mg  2 mg Oral QHS John Robbins I, NP   2 mg at 05/24/17 2136  . traZODone (DESYREL) tablet 150 mg  150 mg Oral QHS John Robbins I, NP   150 mg at 05/24/17 2137   Lab Results: No results found for this or any previous visit (from the past 81 hour(s)).  Blood Alcohol level:  Lab Results  Component  Value Date   ETH <10 05/19/2017   ETH <10 27/74/1287   Metabolic Disorder Labs: Lab Results  Component Value Date   HGBA1C 5.0 05/22/2017   MPG 96.8 05/22/2017   MPG 96.8 02/08/2017   No results found for: PROLACTIN Lab Results  Component Value Date   CHOL 178 05/22/2017   TRIG 75 05/22/2017   HDL 47 05/22/2017   CHOLHDL 3.8 05/22/2017   VLDL 15 05/22/2017   LDLCALC 116 (H) 05/22/2017   LDLCALC 96 02/08/2017   Physical Findings: AIMS: Facial and Oral Movements Muscles of Facial Expression: None, normal Lips and Perioral Area: None, normal Jaw: None, normal Tongue: None, normal,Extremity Movements Upper (arms, wrists, hands, fingers): None, normal Lower (legs, knees, ankles, toes): None, normal, Trunk Movements Neck, shoulders, hips: None, normal, Overall Severity Severity of abnormal movements (highest score from questions above): None, normal Incapacitation due to abnormal movements: None, normal Patient's awareness of abnormal movements (rate only patient's report): No Awareness, Dental Status Current problems with teeth and/or dentures?: No Does patient usually wear dentures?: No  CIWA:  CIWA-Ar Total: 1 COWS:  COWS Total Score:  2  Musculoskeletal: Strength & Muscle Tone: within normal limits Gait & Station: normal Patient leans: N/A  Psychiatric Specialty Exam: Physical Exam  Nursing note and vitals reviewed.   Review of Systems  Constitutional: Negative for chills and fever.  Respiratory: Negative for cough and shortness of breath.   Cardiovascular: Negative for chest pain.  Gastrointestinal: Negative for abdominal pain, heartburn, nausea and vomiting.  Psychiatric/Behavioral: Negative for depression, hallucinations and suicidal ideas. The patient is not nervous/anxious.     Blood pressure 139/82, pulse (!) 106, temperature 98.2 F (36.8 C), temperature source Oral, resp. rate 16, height 6' (1.829 m), weight 68 kg (150 lb).Body mass index is 20.34 kg/m.  General Appearance: Casual and Fairly Groomed  Eye Contact:  Good  Speech:  Clear and Coherent and Normal Rate  Volume:  Normal  Mood:  Anxious and Depressed  Affect:  Appropriate, Congruent and Constricted  Thought Process:  Coherent and Goal Directed  Orientation:  Full (Time, Place, and Person)  Thought Content:  Logical  Suicidal Thoughts:  No  Homicidal Thoughts:  No  Memory:  Immediate;   Fair Recent;   Fair Remote;   Fair  Judgement:  Fair  Insight:  Fair  Psychomotor Activity:  Normal  Concentration:  Concentration: Fair  Recall:  [employer] of Knowledge:  Fair  Language:  Good  Akathisia:  No  Handed:    AIMS (if indicated):     Assets:  Communication Skills Resilience Social Support  ADL's:  Intact  Cognition:  WNL  Sleep:  Number of Hours: 5.5   Treatment Plan Summary: Daily contact with patient to assess and evaluate symptoms and progress in treatment and Medication management. Pt is reporting incremental improvement of his presenting symptoms, and he notes that increased doses of gabapentin and risperdal have been helpful. He would like to continue his current regimen without changes today. Denies any new issues or  symptoms.  - Continue inpatient hospitalization  - Will continue today 05/25/2017 plan as below except where it is noted.  - MDD with psychotic features             - Continue risperdal 55m po qhs  -Anxiety             - Continue gabapentin 3043mpo TID             -  Continue atarax 20m po q6h prn anxiety  - Insomnia              -Continue trazodone 1516mpo qhs  -HIV infection   - Continue Triumeq 600-50-30059mo qDay  -Sinus infection   - Continue augmentin 875-124m23m BID x7 days   - Continue flonase 50mc8mT 1 spray per nostril BID   - Continue sudafed (long-acting) 120mg 39mID prn congestion  -Encourage participation in groups and the therapeutic milieu  -Discharge planning will be ongoing   Porshea Janowski John SparPMHNP, FNP-BC. 05/25/2017, 2:09 PMPatient ID: Aarsh SJoycelyn Rua   DOB: [birth year]  MRN: [id number]

## 2017-05-25 NOTE — Plan of Care (Signed)
No self injury behaviors observed or expressed

## 2017-05-25 NOTE — Progress Notes (Signed)
D:Pt c/o congestion this morning and was given prn medication. Pt is participating and interacting on the unit. He denies any depression or anxiety today and is working on staying positive. A:Offered support, encouragement and 15 minute checks. R:Pt denies si and hi. Safety maintained on the unit.

## 2017-05-25 NOTE — [facility] Group Notes (Signed)
LCSW Group Therapy Note  05/25/2017 1:15pm  Type of Therapy/Topic:  Group Therapy:  Feelings about Diagnosis  Participation Level:  Active   Description of Group:   This group will allow patients to explore their thoughts and feelings about diagnoses they have received. Patients will be guided to explore their level of understanding and acceptance of these diagnoses. Facilitator will encourage patients to process their thoughts and feelings about the reactions of others to their diagnosis and will guide patients in identifying ways to discuss their diagnosis with significant others in their lives. This group will be process-oriented, with patients participating in exploration of their own experiences, giving and receiving support, and processing challenge from other group members.   Therapeutic Goals: 1. Patient will demonstrate understanding of diagnosis as evidenced by identifying two or more symptoms of the disorder 2. Patient will be able to express two feelings regarding the diagnosis 3. Patient will demonstrate their ability to communicate their needs through discussion and/or role play  Summary of Patient Progress:  John Robbins was attentive and engaged during today's processing group. He shared that he feels a sense of frustration with his mental illness diagnosis because "it's not like a medical diagnosis where there is a straightforward treatment. With mental illness, the doctors often have to try lots of different medications and treatments before anything really works." He continues to show improving insight and progress in the group setting.   Therapeutic Modalities:   Cognitive Behavioral Therapy Brief Therapy Feelings Identification    Kimber Relic Smart, LCSW 05/25/2017 12:47 PM

## 2017-05-25 NOTE — Progress Notes (Signed)
Pt reports his day has been "ok". He has been in the dayroom talking with other patients and watching TV.  He denies SI/HI/AVH.  He tells Probation officer that he will probably discharge Friday.  He voices no needs or concerns this evening.  He says the Sudafed is helping his congestion.  Support and encouragement offered.  Discharge plans are in process.  Safety maintained iwht q15 minute checks.

## 2017-05-26 MED ORDER — GABAPENTIN 300 MG PO CAPS: 300.0000 mg | ORAL_CAPSULE | Freq: Three times a day (TID) | ORAL | 0 refills | Status: DC

## 2017-05-26 MED ORDER — TRAZODONE HCL 150 MG PO TABS: 150.0000 mg | ORAL_TABLET | Freq: Every day | ORAL | 0 refills | Status: DC

## 2017-05-26 MED ORDER — ABACAVIR-DOLUTEGRAVIR-LAMIVUD 600-50-300 MG PO TABS: ORAL_TABLET | ORAL | 0 refills | Status: DC

## 2017-05-26 MED ORDER — AMOXICILLIN-POT CLAVULANATE 875-125 MG PO TABS: 1.0000 | ORAL_TABLET | Freq: Two times a day (BID) | ORAL | Status: DC

## 2017-05-26 MED ORDER — FLUTICASONE PROPIONATE 50 MCG/ACT NA SUSP: 1.0000 | Freq: Two times a day (BID) | NASAL | 2 refills | Status: AC

## 2017-05-26 MED ORDER — RISPERIDONE 2 MG PO TABS: 2.0000 mg | ORAL_TABLET | Freq: Every day | ORAL | 0 refills | Status: DC

## 2017-05-26 MED ORDER — HYDROXYZINE HCL 50 MG PO TABS: 50.0000 mg | ORAL_TABLET | Freq: Four times a day (QID) | ORAL | 0 refills | Status: DC | PRN

## 2017-05-26 NOTE — Progress Notes (Signed)
  [facility] Adult Case Management Discharge Plan :  Will you be returning to the same living situation after discharge:  Yes,  home At discharge, do you have transportation home?: Yes,  family member Do you have the ability to pay for your medications: Yes,  [employer] of information consent forms completed and submitted to medical records by CSW.  Patient to Follow up at: Follow-up Information    [facility] Follow up on 05/30/2017.   Specialty:  Behavioral Health Why:  Appt with Dr. Daron Offer for medication management on Tuesday 2/19 at 8:00AM. Thank you.  Contact information: [facility] [place] Follow up on 06/01/2017.   Why:  Therapy assessment at 10:00AM with Melissa. Please stop by their office after discharge to pay $20 deposit and provide them with insurance card so they can make a copy in order to hold appointment. Thank you.  Contact information: [place] [id number] [phone]           Next level of care provider has access to [place] and Suicide Prevention discussed: Yes,  SPE completed with pt's mother. SPI pamphlet and Mobile Crisis information provided.  Have you used any form of tobacco in the last 30 days? (Cigarettes, Smokeless Tobacco, Cigars, and/or Pipes): No  Has patient been referred to the Quitline?: N/A patient is not a smoker  Patient has been referred for addiction treatment: Yes  [employer], LCSW 05/26/2017, 8:39 AM

## 2017-05-26 NOTE — Progress Notes (Signed)
D:  Patient denied SI and HI, contracts for safety.  Denied A/V hallucinations. A:  Medications administered per MD orders.  Emotional support and encouragement. R:  Safety maintained with 15 minute checks.   Patient looking forward to today's discharge.

## 2017-05-26 NOTE — Progress Notes (Signed)
Recreation Therapy Notes  Date: 05/26/17 Time: 0930 Location: 300 Hall Dayroom  Group Topic: Stress Management  Goal Area(s) Addresses:  Patient will verbalize importance of using healthy stress management.  Patient will identify positive emotions associated with healthy stress management.   Behavioral Response: Engaged  Intervention: Stress Management  Activity :  Body Scan Meditation.  LRT introduced the stress management technique of meditation.  LRT played a meditation from the Calm app that allowed patients to take inventory of any sensations or tension they may have been experiencing.  Education:  Stress Management, Discharge Planning.   Education Outcome: Acknowledges edcuation/In group clarification offered/Needs additional education  Clinical Observations/Feedback: Pt attended group.    Victorino Sparrow, LRT/CTRS         Victorino Sparrow A 05/26/2017 12:43 PM

## 2017-05-26 NOTE — Progress Notes (Signed)
Pt reports his day has been good.  He had a visit from his significant other who brought him Valentines gifts.  He denies SI/HI/AVH.  He says his congestion is improving with the Sudafed.  He voices no needs or concerns.  He reports he will probably discharge tomorrow to another facility for further rehab.  Support and encouragement offered.  Meds given as ordered.  Discharge plans are in process.  Safety maintained with q15 minute checks.

## 2017-05-26 NOTE — BHH Suicide Risk Assessment (Signed)
[facility] Discharge Suicide Risk Assessment   Principal Problem: Severe recurrent major depression with psychotic features [facility]) Discharge Diagnoses:  Patient Active Problem List   Diagnosis Date Noted  . Severe recurrent major depression with psychotic features ([place]) [F33.3] 05/21/2017  . Methamphetamine abuse ([place]) [F15.10] 05/20/2017  . Attention deficit hyperactivity disorder (ADHD) [F90.9] 02/08/2017  . PTSD (post-traumatic stress disorder) [F43.10] 02/08/2017  . Auditory hallucination [R44.0] 02/07/2017  . Schizoaffective disorder, bipolar type ([place]) [F25.0] 02/07/2017  . Amphetamine use disorder, severe ([place]) [F15.20] 02/07/2017  . Substance-induced psychotic disorder ([place]) [F19.959] 02/07/2017  . Otosclerosis of left ear [H80.92] 11/04/2016  . Conductive hearing loss in left ear [H90.12] 10/21/2016  . Tinnitus of both ears [H93.13] 09/20/2016  . Raynaud's disease without gangrene [I73.00] 09/20/2016  . Polyarthralgia [M25.50] 09/20/2016  . Venereal disease contact [Z20.2] 09/20/2016  . Back pain [M54.9] 08/26/2016  . Bilateral hand swelling [M79.89] 08/26/2016  . Screen for STD (sexually transmitted disease) [Z11.3] 08/26/2016  . Fatigue [R53.83] 08/08/2016  . Rash and nonspecific skin eruption [R21] 08/03/2016  . Anxiety and depression [F41.9, F32.9] 07/01/2016  . Human immunodeficiency virus (HIV) disease ([place]) [B20] 02/26/2015  . Encounter for long-term (current) use of medications [Z79.899] 02/26/2015  . Celiac disease [K90.0] 02/26/2015  . History of seizure [Z87.898] 12/25/2013  . History of rectal bleeding [Z87.19] 12/25/2013  . Hepatitis B core antibody positive [R76.8] 12/25/2013    Total Time spent with patient: 30 minutes  Musculoskeletal: Strength & Muscle Tone: within normal limits Gait & Station: normal Patient leans: N/A  Psychiatric Specialty Exam: Review of Systems  Constitutional: Negative for chills and fever.  Respiratory: Negative for cough and  shortness of breath.   Cardiovascular: Negative for chest pain.  Gastrointestinal: Negative for abdominal pain, heartburn, nausea and vomiting.  Psychiatric/Behavioral: Negative for depression, hallucinations and suicidal ideas. The patient is not nervous/anxious.     Blood pressure 116/83, pulse (!) 103, temperature 98.4 F (36.9 C), resp. rate 20, height 6' (1.829 m), weight 68 kg (150 lb).Body mass index is 20.34 kg/m.  General Appearance: Casual and Fairly Groomed  Engineer, water::  Good  Speech:  Clear and Coherent and Normal Rate  Volume:  Normal  Mood:  Euthymic  Affect:  Appropriate, Congruent and Constricted  Thought Process:  Coherent and Goal Directed  Orientation:  Full (Time, Place, and Person)  Thought Content:  Logical  Suicidal Thoughts:  No  Homicidal Thoughts:  No  Memory:  Immediate;   Fair Recent;   Fair Remote;   Fair  Judgement:  Fair  Insight:  Fair  Psychomotor Activity:  Normal  Concentration:  Fair  Recall:  [employer] of Knowledge:Fair  Language: Fair  Akathisia:  No  Handed:    AIMS (if indicated):     Assets:  Communication Skills Leisure Time Physical Health Resilience  Sleep:  Number of Hours: 6  Cognition: WNL  ADL's:  Intact   Mental Status Per Nursing Assessment::   On Admission:  Suicidal ideation indicated by others, Suicidal ideation indicated by patient  Demographic Factors:  Male and Caucasian  Loss Factors: NA  Historical Factors: NA  Risk Reduction Factors:   Positive social support, Positive therapeutic relationship and Positive coping skills or problem solving skills  Continued Clinical Symptoms:  Severe Anxiety and/or Agitation Depression:   Severe Alcohol/Substance Abuse/Dependencies  Cognitive Features That Contribute To Risk:  None    Suicide Risk:  Minimal: No identifiable suicidal ideation.  Patients presenting with no risk factors  but with morbid ruminations; may be classified as minimal risk based on the  severity of the depressive symptoms  Follow-up [facility] Follow up on 05/30/2017.   Specialty:  Behavioral Health Why:  Appt with Dr. Daron Offer for medication management on Tuesday 2/19 at 8:00AM. Thank you.  Contact information: [facility] [place] Follow up on 06/01/2017.   Why:  Therapy assessment at 10:00AM with Melissa. Please stop by their office after discharge to pay $20 deposit and provide them with insurance card so they can make a copy in order to hold appointment. Thank you.  Contact information: [place] [address] [phone]         Subjective Data: John Robbins is a 42 y/o M with history of MDD, methamphetamine use disorder, and HIV who was admitted voluntarily with worsening depression, SI without plan, AH, and illicit substance use of methamphetamine. He was started on risperdal to address AH and gabapentin for anxiety, and both doses were titrated up during his stay. Pt has been reporting incremental improvement of his presenting symptoms, and this morning he denied any concerns to RN staff.  Upon evaluation today, pt reports, "I'm good." He denies any specific complaints today aside from anxiety regarding possible return of AH. He denies SI/HI/AH/VH. He is sleeping well. His appetite is good. He has some ongoing nasal congestion which is improving.  He states, "I'm worried about the hallucinations coming back." Discussed with patient about how his symptoms have improved with use of his current medications and avoiding illicit substance use should help minimize recurrence of AH. Pt is in agreement to have outpatient follow up with Dr. Daron Offer next week. He was able to engage in safety planning including plan to return to [facility] or contact emergency services if he feels unable to maintain his own safety or the safety of  others. Pt had no further questions, comments, or concerns.    Plan Of Care/Follow-up recommendations:    - Discharge to outpatient level of care  - MDD with psychotic features - Continue risperdal 57m po qhs  -Anxiety - Continue gabapentin 3033mpo TID - Continue atarax 5045mo q6h prn anxiety  - Insomnia -Continue trazodone 150m42m qhs  -HIV infection             - Continue Triumeq 600-50-300mg68mqDay  -Sinus infection             - Continue augmentin 875-124mg 54mID x7 days             - Continue flonase 50mcg/43m1 spray per nostril BID             - Continue sudafed (long-acting) 120mg po7m prn congestion  Activity:  as tolerated Diet:  normal Tests:  NA Other:  see above for DC plan  ChristopPennelope Bracken5/2019, 9:50 AM

## 2017-05-26 NOTE — Progress Notes (Signed)
Discharge Note:  Patient discharged home with family member.  Patient denied SI and HI.  Denied A/V hallucinations.  Denied pain.  Suicide prevention information given and discussed with patient who stated he understood and had no questions.  Patient stated he received all his belongings, clothing, toiletries, misc items, etc.  Patient stated he appreciated all assistance received from [facility] staff.  All required discharge information given to patient at discharge.

## 2017-05-26 NOTE — Discharge Summary (Signed)
Physician Discharge Summary Note  Patient:  John Robbins is an 42 y.o., male  MRN:  [id number]  DOB:  02-11-1976  Patient phone:  [phone] (home)   Patient address:   [address] [place] [address],   Total Time spent with patient: Greater than 30 minutes  Date of Admission:  05/21/2017  Date of Discharge: 05/26/17  Reason for Admission: Worsening depression, suicidal ideations without plan, auditory hallucinations & illicit substance use of methamphetamine.  Principal Problem: Severe recurrent major depression with psychotic features [facility])  Discharge Diagnoses: Patient Active Problem List   Diagnosis Date Noted  . Schizoaffective disorder, bipolar type ([place]) [F25.0] 02/07/2017    Priority: High  . Amphetamine use disorder, severe ([place]) [F15.20] 02/07/2017    Priority: High  . Severe recurrent major depression with psychotic features ([place]) [F33.3] 05/21/2017  . Methamphetamine abuse ([place]) [F15.10] 05/20/2017  . Attention deficit hyperactivity disorder (ADHD) [F90.9] 02/08/2017  . PTSD (post-traumatic stress disorder) [F43.10] 02/08/2017  . Auditory hallucination [R44.0] 02/07/2017  . Substance-induced psychotic disorder ([place]) [F19.959] 02/07/2017  . Otosclerosis of left ear [H80.92] 11/04/2016  . Conductive hearing loss in left ear [H90.12] 10/21/2016  . Tinnitus of both ears [H93.13] 09/20/2016  . Raynaud's disease without gangrene [I73.00] 09/20/2016  . Polyarthralgia [M25.50] 09/20/2016  . Venereal disease contact [Z20.2] 09/20/2016  . Back pain [M54.9] 08/26/2016  . Bilateral hand swelling [M79.89] 08/26/2016  . Screen for STD (sexually transmitted disease) [Z11.3] 08/26/2016  . Fatigue [R53.83] 08/08/2016  . Rash and nonspecific skin eruption [R21] 08/03/2016  . Anxiety and depression [F41.9, F32.9] 07/01/2016  . Human immunodeficiency virus (HIV) disease ([place]) [B20] 02/26/2015  . Encounter for long-term (current) use of  medications [Z79.899] 02/26/2015  . Celiac disease [K90.0] 02/26/2015  . History of seizure [Z87.898] 12/25/2013  . History of rectal bleeding [Z87.19] 12/25/2013  . Hepatitis B core antibody positive [R76.8] 12/25/2013   Past Medical History:  Past Medical History:  Diagnosis Date  . Allergy   . Anxiety   . Celiac disease   . History of substance abuse   . HIV infection ([place])   . Raynaud disease   . Seizures ([place])     Past Surgical History:  Procedure Laterality Date  . WISDOM TOOTH EXTRACTION     Family History:  Family History  Problem Relation Age of Onset  . Heart disease Father 50  . Hypertension Father   . Heart disease Maternal Grandfather        early 108s  . Hypertension Maternal Grandfather   . Hypertension Mother   . Rheum arthritis Mother   . Crohn's disease Mother   . Crohn's disease Sister   . Rheum arthritis Maternal Grandmother   . Breast cancer Maternal Grandmother   . Colon cancer Paternal Grandmother   . Hypertension Paternal Grandfather   . Esophageal cancer Neg Hx   . Stomach cancer Neg Hx    Social History:  Social History   Substance and Sexual Activity  Alcohol Use Yes  . Alcohol/week: 1.2 oz  . Types: 2 Glasses of wine per week   Comment: occ     Social History   Substance and Sexual Activity  Drug Use Yes  . Types: Methamphetamines   Comment: Hx of maijuana, ecstacy, GHB, ketamine, amephetamines, cocaine - nothing since 2009    Social History   Socioeconomic History  . Marital status: Unknown    Spouse name: None  . Number of children: None  . Years  of education: None  . Highest education level: None  Social Needs  . Financial resource strain: None  . Food insecurity - worry: None  . Food insecurity - inability: None  . Transportation needs - medical: None  . Transportation needs - non-medical: None  Occupational History  . None  Tobacco Use  . Smoking status: Never Smoker  . Smokeless tobacco: Never Used  Substance  and Sexual Activity  . Alcohol use: Yes    Alcohol/week: 1.2 oz    Types: 2 Glasses of wine per week    Comment: occ  . Drug use: Yes    Types: Methamphetamines    Comment: Hx of maijuana, ecstacy, GHB, ketamine, amephetamines, cocaine - nothing since 2009  . Sexual activity: Yes    Partners: Male    Comment: Uses Condoms  Other Topics Concern  . None  Social History Narrative   Works in Engineer, technical sales.   Hospital Course: (Per Md's SRA): John Robbins is a 42 y/o M with history of MDD, methamphetamine use disorder, and HIV who was admitted voluntarily with worsening depression, SI without plan, AH, and illicit substance use of methamphetamine.He was started on risperdal to address AH and gabapentin for anxiety, and both doses were titrated up during his stay. Pt has been reporting incremental improvement of his presenting symptoms, and this morning he denied any concerns to RN staff.  After his admission assessment, John Robbins's presenting symptoms were identified. It was agreed by the treatment team that he will be admitted for mood stabilization treatment. Then, the medication regimen for the presenting symptoms were discussed with patient & with his consent initiated. He received & was discharged on; Gabapentin 300 mg for agitation, Hydroxyzine 50 mg prn for anxiety, Risperdal 2 mg for mood control & Trazodone 150 mg for insomnia. He was enrolled but seldom participated in the group counseling sessions being offered & held on this unit. He did spend a lot of time in his room reading books. He says he does not like being around other drug addicts as their discussions about their drug using behaviors were triggers for him to go use again. He presented other significant pre-existing medical issues that required treatment. He was resumed on all those pertinent home medications for those health issues. He tolerated his treatment regimen without any adverse effects or reactions reported.  Upon his discharge  evaluation today with the attending psychiatrist, pt reports, "I'm good." He denies any specific complaints today aside from anxiety regarding possible return of AH. He denies SI/HI/AH/VH. He is sleeping well. His appetite is good. He has some ongoing nasal congestion which is improving.  He states, "I'm worried about the hallucinations coming back." Discussed with patient about how his symptoms have improved with use of his current medications and avoiding illicit substance use should help minimize recurrence of AH. Pt is in agreement to have outpatient follow up with Dr. Daron Offer next week. He was able to engage in safety planning including plan to return to [facility] or contact emergency services if he feels unable to maintain his own safety or the safety of others. Pt had no further questions, comments, or concerns.  Upon discharge, Kayvan presents mentally & medically stable. He will continue further mental health care for routine visits & medication management on an outpatient basis as noted below. He is provided with the remaining doses of his antibiotic (Augmentin) for sinusitis. He left [facility] with all personal belongings in no apparent distress. Transportation per family.  Physical Findings: AIMS:  Facial and Oral Movements Muscles of Facial Expression: None, normal Lips and Perioral Area: None, normal Jaw: None, normal Tongue: None, normal,Extremity Movements Upper (arms, wrists, hands, fingers): None, normal Lower (legs, knees, ankles, toes): None, normal, Trunk Movements Neck, shoulders, hips: None, normal, Overall Severity Severity of abnormal movements (highest score from questions above): None, normal Incapacitation due to abnormal movements: None, normal Patient's awareness of abnormal movements (rate only patient's report): No Awareness, Dental Status Current problems with teeth and/or dentures?: No Does patient usually wear dentures?: No  CIWA:  CIWA-Ar Total: 1 COWS:  COWS Total Score:  2  Musculoskeletal: Strength & Muscle Tone: within normal limits Gait & Station: normal Patient leans: N/A  Psychiatric Specialty Exam: Physical Exam  Nursing note and vitals reviewed. Constitutional: He appears well-developed.  HENT:  Head: Normocephalic.  Eyes: Pupils are equal, round, and reactive to light.  Neck: Normal range of motion.  Cardiovascular: Normal rate.  Respiratory: Effort normal.  GI: Soft.  Genitourinary:  Genitourinary Comments: Deferred  Musculoskeletal: Normal range of motion.  Neurological: He is alert.  Skin: Skin is warm.  Psychiatric: He has a normal mood and affect. His speech is normal and behavior is normal. Thought content normal. Cognition and memory are normal. He expresses impulsivity.    Review of Systems  Constitutional: Negative.   HENT: Negative.   Eyes: Negative.   Respiratory: Negative.   Cardiovascular: Negative.   Gastrointestinal: Negative.   Genitourinary: Negative.   Musculoskeletal: Negative.   Skin: Negative.   Neurological: Negative.   Endo/Heme/Allergies: Negative.   Psychiatric/Behavioral: Positive for depression (Stable), hallucinations (Hx. psychosis) and substance abuse (Hx. Amphetamine use disorder). Negative for memory loss and suicidal ideas. The patient has insomnia (Stable). The patient is not nervous/anxious.   All other systems reviewed and are negative.   Blood pressure 110/85, pulse 87, temperature 98.4 F (36.9 C), resp. rate 20, height 6' (1.829 m), weight 68 kg (150 lb).Body mass index is 20.34 kg/m.  See md's SRA   Have you used any form of tobacco in the last 30 days? (Cigarettes, Smokeless Tobacco, Cigars, and/or Pipes): No  Has this patient used any form of tobacco in the last 30 days? (Cigarettes, Smokeless Tobacco, Cigars, and/or Pipes): No  Blood Alcohol level:  Lab Results  Component Value Date   ETH <10 05/19/2017   ETH <10 79/89/2119   Metabolic Disorder Labs:  Lab Results  Component  Value Date   HGBA1C 5.0 05/22/2017   MPG 96.8 05/22/2017   MPG 96.8 02/08/2017   No results found for: PROLACTIN Lab Results  Component Value Date   CHOL 178 05/22/2017   TRIG 75 05/22/2017   HDL 47 05/22/2017   CHOLHDL 3.8 05/22/2017   VLDL 15 05/22/2017   LDLCALC 116 (H) 05/22/2017   [place] 96 02/08/2017   See Psychiatric Specialty Exam and Suicide Risk Assessment completed by Attending Physician prior to discharge.  Discharge destination:  Home  Is patient on multiple antipsychotic therapies at discharge:  Yes,   Do you recommend tapering to monotherapy for antipsychotics?  No   Has Patient had three or more failed trials of antipsychotic monotherapy by history:  No  Recommended Plan for Multiple Antipsychotic Therapies: NA  Allergies as of 05/26/2017      Reactions   Abilify [aripiprazole] Other (See Comments)   Akathesia   Gluten Meal Other (See Comments)   Neurological      Medication List    STOP taking these medications   fluPHENAZine  10 MG tablet Commonly known as:  PROLIXIN   lurasidone 80 MG Tabs tablet Commonly known as:  LATUDA     TAKE these medications     Indication  abacavir-dolutegravir-lamiVUDine 600-50-300 MG tablet Commonly known as:  TRIUMEQ TAKE 1 TABLET BY MOUTH ONCE EVERY DAY: For HIV infection What changed:  See the new instructions.  Indication:  HIV Disease   amoxicillin-clavulanate 875-125 MG tablet Commonly known as:  AUGMENTIN Take 1 tablet by mouth every 12 (twelve) hours. For Sinusitis  Indication:  Sinus Irritation and Congestion   fluticasone 50 MCG/ACT nasal spray Commonly known as:  FLONASE Place 1 spray into both nostrils 2 (two) times daily. For nasal congestion  Indication:  Signs and Symptoms of Nose Diseases   gabapentin 300 MG capsule Commonly known as:  NEURONTIN Take 1 capsule (300 mg total) by mouth 3 (three) times daily. For agitation  Indication:  Agitation   hydrOXYzine 50 MG tablet Commonly known  as:  ATARAX/VISTARIL Take 1 tablet (50 mg total) by mouth every 6 (six) hours as needed for anxiety.  Indication:  Feeling Anxious   risperiDONE 2 MG tablet Commonly known as:  RISPERDAL Take 1 tablet (2 mg total) by mouth at bedtime. For mood control  Indication:  Mood control   traZODone 150 MG tablet Commonly known as:  DESYREL Take 1 tablet (150 mg total) by mouth at bedtime. For sleep What changed:  additional instructions  Indication:  [facility] Follow up on 05/30/2017.   Specialty:  Behavioral Health Why:  Appt with Dr. Daron Offer for medication management on Tuesday 2/19 at 8:00AM. Thank you.  Contact information: [facility] [place] Follow up on 06/01/2017.   Why:  Therapy assessment at 10:00AM with Melissa. Please stop by their office after discharge to pay $20 deposit and provide them with insurance card so they can make a copy in order to hold appointment. Thank you.  Contact information: [address] [place] [address] [phone]          Follow-up recommendations: Activity:  As tolerated Diet: As recommended by your primary care doctor. Keep all scheduled follow-up appointments as recommended.   Comments: Patient is instructed prior to discharge to: Take all medications as prescribed by his/her mental healthcare provider. Report any adverse effects and or reactions from the medicines to his/her outpatient provider promptly. Patient has been instructed & cautioned: To not engage in alcohol and or illegal drug use while on prescription medicines. In the event of worsening symptoms, patient is instructed to call the crisis hotline, 911 and or go to the nearest ED for appropriate evaluation and treatment of symptoms. To follow-up with his/her primary care provider for your other medical  issues, concerns and or health care needs.   Signed: Lindell Spar, NP, PMHNP, FNP-BC 05/26/2017, 3:15 PM   Patient seen, Suicide Assessment Completed.  Disposition Plan Reviewed

## 2017-05-30 ENCOUNTER — Ambulatory Visit (HOSPITAL_COMMUNITY): Payer: 59 | Admitting: Psychiatry

## 2017-05-30 ENCOUNTER — Ambulatory Visit (INDEPENDENT_AMBULATORY_CARE_PROVIDER_SITE_OTHER): Payer: 59 | Admitting: Psychiatry

## 2017-05-30 ENCOUNTER — Encounter (HOSPITAL_COMMUNITY): Payer: Self-pay | Admitting: Psychiatry

## 2017-05-30 VITALS — BP 126/74 | HR 60 | Ht 73.0 in | Wt 159.0 lb

## 2017-05-30 DIAGNOSIS — F19951 Other psychoactive substance use, unspecified with psychoactive substance-induced psychotic disorder with hallucinations: Secondary | ICD-10-CM | POA: Diagnosis not present

## 2017-05-30 DIAGNOSIS — Z21 Asymptomatic human immunodeficiency virus [HIV] infection status: Secondary | ICD-10-CM

## 2017-05-30 DIAGNOSIS — F1520 Other stimulant dependence, uncomplicated: Secondary | ICD-10-CM

## 2017-05-30 DIAGNOSIS — F4312 Post-traumatic stress disorder, chronic: Secondary | ICD-10-CM

## 2017-05-30 DIAGNOSIS — Z79899 Other long term (current) drug therapy: Secondary | ICD-10-CM

## 2017-05-30 NOTE — Patient Instructions (Signed)
[facility] [address]  [address] [place], [place] [address] Phone: [phone]  I'm referring you to their outpatient practice for med management and therapy for mental health and comorbid substance treatment

## 2017-05-30 NOTE — Progress Notes (Signed)
[facility] MD/PA/NP OP Progress Note  05/30/2017 8:46 AM John Robbins  MRN:  [id number]  Chief Complaint: med check, hospital discharge  HPI: John Robbins presents for hospital discharge follow-up after repeat episode of psychosis in the context of IV methamphetamine use/relapse.  His longest day of sobriety has been 45 days over the past 1 year.  He has never participated in any inpatient substance abuse treatment, but has participated in the chemical dependence IOP program in this office.  I spent time with him discussing my recommendation that he has a higher level of care for substance abuse treatment and referral to [facility].  He was agreeable to this referral and does feel that expert opinion would be helpful.  Discussed that they could take over medication management and mental health care from there, and if he has any concerns he is welcome to return here in the future.   He has established a new therapy follow-up at mood treatment center in [place].  He denies any acute safety issues.  Reports that the voices continue to be intermittently present, and he recognizes that this is intricately connected to his substance abuse of methamphetamines.  He continues to struggle with triggers to use, but reports that he has been sober since discharge from the hospital.  He does not present with any gross disorganization, paranoia, response to internal stimuli, and is extremely well-groomed, and interactive and appropriate throughout our encounter.  He shares that he will be moving into an [employer], so that he can be in a therapeutic environment 24/7, as he feels that living alone is a dangerous thing for him right now in terms of substance use.  He continues on the medications as prescribed from discharge, denies any side effects.  He continues on antiretroviral therapies as prescribed.  Visit Diagnosis:    ICD-10-CM   1. Methamphetamine use disorder, severe (HCC) F15.20   2.  Chronic post-traumatic stress disorder (PTSD) F43.12   3. Substance-induced psychotic disorder with hallucinations ([place]) F19.951     Past Psychiatric History: Recently discharged from hospital at behavioral health for IV methamphetamine use and substance-induced psychosis  Past Medical History:  Past Medical History:  Diagnosis Date  . Allergy   . Anxiety   . Celiac disease   . History of substance abuse   . HIV infection ([place])   . Raynaud disease   . Seizures ([place])     Past Surgical History:  Procedure Laterality Date  . WISDOM TOOTH EXTRACTION      Family Psychiatric History: See intake H&P for full details. Reviewed, with no updates at this time.   Family History:  Family History  Problem Relation Age of Onset  . Heart disease Father 9  . Hypertension Father   . Heart disease Maternal Grandfather        early 28s  . Hypertension Maternal Grandfather   . Hypertension Mother   . Rheum arthritis Mother   . Crohn's disease Mother   . Crohn's disease Sister   . Rheum arthritis Maternal Grandmother   . Breast cancer Maternal Grandmother   . Colon cancer Paternal Grandmother   . Hypertension Paternal Grandfather   . Esophageal cancer Neg Hx   . Stomach cancer Neg Hx     Social History:  Social History   Socioeconomic History  . Marital status: Unknown    Spouse name: None  . Number of children: None  . Years of education: None  . Highest education level: None  Social Needs  . Financial resource strain: None  . Food insecurity - worry: None  . Food insecurity - inability: None  . Transportation needs - medical: None  . Transportation needs - non-medical: None  Occupational History  . None  Tobacco Use  . Smoking status: Never Smoker  . Smokeless tobacco: Never Used  Substance and Sexual Activity  . Alcohol use: No    Alcohol/week: 1.2 oz    Types: 2 Glasses of wine per week    Frequency: Never    Comment: occ  . Drug use: Yes    Types:  Methamphetamines    Comment: Hx of maijuana, ecstacy, GHB, ketamine, amephetamines, cocaine - nothing since 2009  . Sexual activity: Yes    Partners: Male    Comment: Uses Condoms  Other Topics Concern  . None  Social History Narrative   Works in Engineer, technical sales.    Allergies:  Allergies  Allergen Reactions  . Abilify [Aripiprazole] Other (See Comments)    Akathesia  . Gluten Meal Other (See Comments)    Neurological    Metabolic Disorder Labs: Lab Results  Component Value Date   HGBA1C 5.0 05/22/2017   MPG 96.8 05/22/2017   MPG 96.8 02/08/2017   No results found for: PROLACTIN Lab Results  Component Value Date   CHOL 178 05/22/2017   TRIG 75 05/22/2017   HDL 47 05/22/2017   CHOLHDL 3.8 05/22/2017   VLDL 15 05/22/2017   LDLCALC 116 (H) 05/22/2017   LDLCALC 96 02/08/2017   Lab Results  Component Value Date   TSH 0.628 05/22/2017   TSH 0.982 02/08/2017    Therapeutic Level Labs: No results found for: LITHIUM No results found for: VALPROATE No components found for:  CBMZ  Current Medications: Current Outpatient Medications  Medication Sig Dispense Refill  . abacavir-dolutegravir-lamiVUDine (TRIUMEQ) 600-50-300 MG tablet TAKE 1 TABLET BY MOUTH ONCE EVERY DAY: For HIV infection 1 tablet 0  . amoxicillin-clavulanate (AUGMENTIN) 875-125 MG tablet Take 1 tablet by mouth every 12 (twelve) hours. For Sinusitis    . fluticasone (FLONASE) 50 MCG/ACT nasal spray Place 1 spray into both nostrils 2 (two) times daily. For nasal congestion  2  . gabapentin (NEURONTIN) 300 MG capsule Take 1 capsule (300 mg total) by mouth 3 (three) times daily. For agitation 270 capsule 0  . hydrOXYzine (ATARAX/VISTARIL) 50 MG tablet Take 1 tablet (50 mg total) by mouth every 6 (six) hours as needed for anxiety. 360 tablet 0  . risperiDONE (RISPERDAL) 2 MG tablet Take 1 tablet (2 mg total) by mouth at bedtime. For mood control 90 tablet 0  . traZODone (DESYREL) 150 MG tablet Take 1 tablet (150 mg total) by  mouth at bedtime. For sleep 90 tablet 0   No current facility-administered medications for this visit.      Musculoskeletal: Strength & Muscle Tone: within normal limits Gait & Station: normal Patient leans: N/A  Psychiatric Specialty Exam: ROS  Blood pressure 126/74, pulse 60, height 6' 1"  (1.854 m), weight 159 lb (72.1 kg).Body mass index is 20.98 kg/m.  General Appearance: Meticulous and Well Groomed  Eye Contact:  Good  Speech:  Clear and Coherent and Normal Rate  Volume:  Normal  Mood:  Euthymic  Affect:  Appropriate and Congruent  Thought Process:  Goal Directed and Descriptions of Associations: Intact  Orientation:  Full (Time, Place, and Person)  Thought Content: Logical   Suicidal Thoughts:  No  Homicidal Thoughts:  No  Memory:  Immediate;  Good  Judgement:  Fair  Insight:  Fair and Present  Psychomotor Activity:  Normal  Concentration:  Concentration: Fair  Recall:  [employer] of Knowledge: Fair  Language: Fair  Akathisia:  Negative  Handed:  Right  AIMS (if indicated): done  Assets:  Communication Skills Desire for Improvement Financial Resources/Insurance Housing Social Support Transportation Vocational/Educational  ADL's:  Intact  Cognition: WNL  Sleep:  Good   Screenings: AIMS     Admission (Discharged) from 05/21/2017 in [facility] Counselor from 05/10/2017 in [place] Counselor from 04/10/2017 in [place] Admission (Discharged) from 02/07/2017 in [place] Total Score  0  7  0  0    AUDIT     Counselor from 02/14/2017 in [place] Admission (Discharged) from 02/07/2017 in [place]  Alcohol Use Disorder Identification Test Final Score (AUDIT)  2  3    GAD-7     Counselor from 05/08/2017 in [place]  Counselor from 04/12/2017 in [place] Counselor from 02/23/2017 in [place] Counselor from 02/14/2017 in [place]  Total GAD-7 Score  21  15  6  15     PHQ2-9     Counselor from 05/08/2017 in [place] Counselor from 04/12/2017 in [place] Counselor from 02/23/2017 in [place] Counselor from 02/14/2017 in [place] Office Visit from 12/08/2016 in [facility]  PHQ-2 Total Score  4  6  2  2   0  PHQ-9 Total Score  16  21  9  9   No data       Assessment and Plan: John Robbins is a 42 year old male, HIV positive, adherent to antiretroviral therapy, with a 1+ year history of IV methamphetamine addiction, with 45 days of sobriety, struggling with associated substance induced psychosis and substance-induced mood symptoms.  He presents today generally euthymic, without any signs or symptoms of overt psychosis.  He does present with continued periods of auditory hallucinations, which at times seem to be more related to his internal monologue rather than true psychosis.  He has participated in the chemical dependence IOP program in this office, but continued to require multiple hospitalizations at [facility] with worsening psychosis in the setting of relapse.  I have recommended that he participate in a higher level of care for substance treatment and have referred him to [facility] for both inpatient and outpatient treatment options, including participation in their IOP.  No further follow-up with this Probation officer.  I am happy to support him having an additional 4 weeks off of work as he arranges follow-up care and focuses on attending NA and individual therapy.  Further leave of absence from work would be determined  by his providers at that time.  1. Methamphetamine use disorder, severe ([place])   2. Chronic post-traumatic stress disorder (PTSD)   3. Substance-induced psychotic disorder with hallucinations (HCC)     Status of current problems: unchanged  Labs Ordered: No orders of the defined types were placed in this encounter.  Labs Reviewed: UDS from recent hospital stay + for methamphetamines  Collateral Obtained/Records Reviewed: recent discharge from [place]:  Continue medications as above; risperidone, trazodone, gabapentin, Vistaril as needed for anxiety Educated patient on the risks of  atypical antipsychotic Referral to [facility]  I spent 20 minutes with the patient in direct face-to-face clinical care.  Greater than 50% of this time was spent in counseling and coordination of care with the patient.    Aundra Dubin, MD 05/30/2017, 8:46 AM

## 2017-06-07 ENCOUNTER — Encounter (HOSPITAL_COMMUNITY): Payer: Self-pay | Admitting: Psychiatry

## 2017-06-07 ENCOUNTER — Ambulatory Visit (INDEPENDENT_AMBULATORY_CARE_PROVIDER_SITE_OTHER): Payer: 59 | Admitting: Licensed Clinical Social Worker

## 2017-06-07 DIAGNOSIS — F411 Generalized anxiety disorder: Secondary | ICD-10-CM | POA: Diagnosis not present

## 2017-06-07 DIAGNOSIS — F1520 Other stimulant dependence, uncomplicated: Secondary | ICD-10-CM | POA: Diagnosis not present

## 2017-06-12 ENCOUNTER — Encounter (HOSPITAL_COMMUNITY): Payer: Self-pay | Admitting: Licensed Clinical Social Worker

## 2017-06-12 NOTE — Progress Notes (Signed)
  Weekly Group Progress Note  Program: OUTPATIENT SKILLS GROUP  Group Time: 5:30-6:30pm  Participation Level: Active  Behavioral Response: Appropriate and Sharing  Type of Therapy:  Psycho-education Group  Skills discussed: Assertiveness, Communication   Summary of Progress: Pt continues to make some progress in group, sharing openly w/ others about his recent decision to enter an [employer]. He has yet to show sustained progress for more than 2 weeks at a time. Pt shares openly in group and provides feedback for others.  Summary of Group: Patients were active and engaged in group skill building session. Counselor encouraged pts to share about their individual goals and needs in order to increase interpersonal effectiveness. Counselor discussed assertive communication and saying "no". 2 new members were present after recently completing CD-IOP at this office.   Archie Balboa, LPCA, LCASA

## 2017-06-14 ENCOUNTER — Ambulatory Visit (HOSPITAL_COMMUNITY): Payer: Self-pay | Admitting: Licensed Clinical Social Worker

## 2017-06-15 NOTE — Addendum Note (Signed)
Addended by: Reggy Eye on: 06/15/2017 10:48 AM   Modules accepted: Orders

## 2017-06-19 ENCOUNTER — Other Ambulatory Visit: Payer: 59

## 2017-06-19 DIAGNOSIS — B20 Human immunodeficiency virus [HIV] disease: Secondary | ICD-10-CM

## 2017-06-20 LAB — T-HELPER CELL (CD4) - (RCID CLINIC ONLY)
CD4 T CELL HELPER: 26 % — AB (ref 33–55)
CD4 T Cell Abs: 470 /uL (ref 400–2700)

## 2017-06-21 LAB — HIV-1 RNA QUANT-NO REFLEX-BLD
HIV 1 RNA QUANT: DETECTED {copies}/mL — AB
HIV-1 RNA QUANT, LOG: DETECTED {Log_copies}/mL — AB

## 2017-06-29 ENCOUNTER — Other Ambulatory Visit: Payer: Self-pay | Admitting: Internal Medicine

## 2017-07-03 ENCOUNTER — Ambulatory Visit (INDEPENDENT_AMBULATORY_CARE_PROVIDER_SITE_OTHER): Payer: 59 | Admitting: Internal Medicine

## 2017-07-03 ENCOUNTER — Encounter: Payer: Self-pay | Admitting: Internal Medicine

## 2017-07-03 VITALS — BP 130/83 | HR 71 | Temp 98.3°F | Ht 73.0 in | Wt 164.8 lb

## 2017-07-03 DIAGNOSIS — B009 Herpesviral infection, unspecified: Secondary | ICD-10-CM | POA: Diagnosis not present

## 2017-07-03 DIAGNOSIS — Z113 Encounter for screening for infections with a predominantly sexual mode of transmission: Secondary | ICD-10-CM

## 2017-07-03 DIAGNOSIS — B20 Human immunodeficiency virus [HIV] disease: Secondary | ICD-10-CM | POA: Diagnosis not present

## 2017-07-03 DIAGNOSIS — F1520 Other stimulant dependence, uncomplicated: Secondary | ICD-10-CM | POA: Diagnosis not present

## 2017-07-03 MED ORDER — VALACYCLOVIR HCL 1 G PO TABS: 1000.0000 mg | ORAL_TABLET | Freq: Two times a day (BID) | ORAL | 5 refills | Status: DC

## 2017-07-03 MED ORDER — VALACYCLOVIR HCL 1 G PO TABS: 1000.0000 mg | ORAL_TABLET | Freq: Every day | ORAL | 11 refills | Status: AC

## 2017-07-03 NOTE — Assessment & Plan Note (Signed)
Doing well.  rtc 6 months.

## 2017-07-03 NOTE — Progress Notes (Signed)
   Subjective:    Patient ID: Yanky Vanderburg, male    DOB: 23-Jul-1975, 42 y.o.   MRN: [id number]  HPI Here for follow up of HIV> Has been on Triumeq with no missed doses.  No associated n/v/d.  No rashes.  CD4 of 470, viral load < 20.  Since last visit has been in [facility] with substance abuse. Feels he is getting appropriate help he needs.     Review of Systems  Constitutional: Negative for unexpected weight change.  Gastrointestinal: Negative for diarrhea.       Objective:   Physical Exam  Constitutional: He appears well-developed and well-nourished. No distress.  HENT:  Mouth/Throat: No oropharyngeal exudate.  Eyes: No scleral icterus.  Cardiovascular: Normal rate, regular rhythm and normal heart sounds.  No murmur heard. Pulmonary/Chest: Effort normal and breath sounds normal.  Lymphadenopathy:    He has no cervical adenopathy.  Skin: No rash noted.   SH: no tobacco       Assessment & Plan:

## 2017-07-03 NOTE — Assessment & Plan Note (Signed)
Multiple outbreaks.  Will give suppressive therapy with Valtrex 1 gram daily and treatment doses as needed.

## 2017-07-03 NOTE — Assessment & Plan Note (Signed)
I encouarged follow up and he is committed.  Also offered counseling here and he will contact us if needed.

## 2017-07-10 ENCOUNTER — Encounter: Payer: Self-pay | Admitting: Medical

## 2017-07-10 ENCOUNTER — Ambulatory Visit (INDEPENDENT_AMBULATORY_CARE_PROVIDER_SITE_OTHER): Payer: 59 | Admitting: Medical

## 2017-07-10 VITALS — BP 122/84 | HR 48 | Temp 97.9°F | Ht 72.0 in | Wt 166.0 lb

## 2017-07-10 DIAGNOSIS — F333 Major depressive disorder, recurrent, severe with psychotic symptoms: Secondary | ICD-10-CM

## 2017-07-10 DIAGNOSIS — K900 Celiac disease: Secondary | ICD-10-CM

## 2017-07-10 DIAGNOSIS — H9012 Conductive hearing loss, unilateral, left ear, with unrestricted hearing on the contralateral side: Secondary | ICD-10-CM | POA: Diagnosis not present

## 2017-07-10 DIAGNOSIS — F250 Schizoaffective disorder, bipolar type: Secondary | ICD-10-CM | POA: Diagnosis not present

## 2017-07-10 DIAGNOSIS — B079 Viral wart, unspecified: Secondary | ICD-10-CM

## 2017-07-10 DIAGNOSIS — F909 Attention-deficit hyperactivity disorder, unspecified type: Secondary | ICD-10-CM

## 2017-07-10 DIAGNOSIS — R945 Abnormal results of liver function studies: Secondary | ICD-10-CM

## 2017-07-10 DIAGNOSIS — F329 Major depressive disorder, single episode, unspecified: Secondary | ICD-10-CM

## 2017-07-10 DIAGNOSIS — M2550 Pain in unspecified joint: Secondary | ICD-10-CM | POA: Diagnosis not present

## 2017-07-10 DIAGNOSIS — H8092 Unspecified otosclerosis, left ear: Secondary | ICD-10-CM

## 2017-07-10 DIAGNOSIS — H9313 Tinnitus, bilateral: Secondary | ICD-10-CM

## 2017-07-10 DIAGNOSIS — F1510 Other stimulant abuse, uncomplicated: Secondary | ICD-10-CM

## 2017-07-10 DIAGNOSIS — Z125 Encounter for screening for malignant neoplasm of prostate: Secondary | ICD-10-CM | POA: Diagnosis not present

## 2017-07-10 DIAGNOSIS — B20 Human immunodeficiency virus [HIV] disease: Secondary | ICD-10-CM | POA: Diagnosis not present

## 2017-07-10 DIAGNOSIS — F419 Anxiety disorder, unspecified: Secondary | ICD-10-CM

## 2017-07-10 DIAGNOSIS — Z79899 Other long term (current) drug therapy: Secondary | ICD-10-CM

## 2017-07-10 DIAGNOSIS — Z0000 Encounter for general adult medical examination without abnormal findings: Secondary | ICD-10-CM | POA: Diagnosis not present

## 2017-07-10 DIAGNOSIS — F19959 Other psychoactive substance use, unspecified with psychoactive substance-induced psychotic disorder, unspecified: Secondary | ICD-10-CM

## 2017-07-10 DIAGNOSIS — Z7189 Other specified counseling: Secondary | ICD-10-CM

## 2017-07-10 DIAGNOSIS — F32A Depression, unspecified: Secondary | ICD-10-CM

## 2017-07-10 DIAGNOSIS — R7989 Other specified abnormal findings of blood chemistry: Secondary | ICD-10-CM

## 2017-07-10 DIAGNOSIS — Z7185 Encounter for immunization safety counseling: Secondary | ICD-10-CM | POA: Insufficient documentation

## 2017-07-10 NOTE — Patient Instructions (Signed)
Thanks for trusting [place] with your health care and for coming in for a physical today.  Below are some general recommendations I have for you:  Yearly screenings See your eye doctor yearly for routine vision care. See your dentist yearly for routine dental care including hygiene visits twice yearly. See me here yearly for a routine physical and preventative care visit   Specific Concerns today:  . We will refer you to dermatology for wart removal . We will call about the liver tests.   Please follow up yearly for a physical.   Preventative Care for Adults - Male      [place]:  A routine yearly physical is a good way to check in with your primary care provider about your health and preventive screening. It is also an opportunity to share updates about your health and any concerns you have, and receive a thorough all-over exam.   Most health insurance companies pay for at least some preventative services.  Check with your health plan for specific coverages.  WHAT PREVENTATIVE SERVICES DO WOMEN NEED?  Adult men should have their weight and blood pressure checked regularly.   Men age 36 and older should have their cholesterol levels checked regularly.  Beginning at age 49 and continuing to age 41, men should be screened for colorectal cancer.  Certain people may need continued testing until age 13.  Updating vaccinations is part of preventative care.  Vaccinations help protect against diseases such as the flu.  Osteoporosis is a disease in which the bones lose minerals and strength as we age. Men ages 25 and over should discuss this with their caregivers  Lab tests are generally done as part of preventative care to screen for anemia and blood disorders, to screen for problems with the kidneys and liver, to screen for bladder problems, to check blood sugar, and to check your cholesterol level.  Preventative services generally include counseling about diet,  exercise, avoiding tobacco, drugs, excessive alcohol consumption, and sexually transmitted infections.    GENERAL RECOMMENDATIONS FOR GOOD HEALTH:  Healthy diet:  Eat a variety of foods, including fruit, vegetables, animal or vegetable protein, such as meat, fish, chicken, and eggs, or beans, lentils, tofu, and grains, such as rice.  Drink plenty of water daily.  Decrease saturated fat in the diet, avoid lots of red meat, processed foods, sweets, fast foods, and fried foods.  Exercise:  Aerobic exercise helps maintain good heart health. At least 30-40 minutes of moderate-intensity exercise is recommended. For example, a brisk walk that increases your heart rate and breathing. This should be done on most days of the week.   Find a type of exercise or a variety of exercises that you enjoy so that it becomes a part of your daily life.  Examples are running, walking, swimming, water aerobics, and biking.  For motivation and support, explore group exercise such as aerobic class, spin class, Zumba, Yoga,or  martial arts, etc.    Set exercise goals for yourself, such as a certain weight goal, walk or run in a race such as a 5k walk/run.  Speak to your primary care provider about exercise goals.  Disease prevention:  If you smoke or chew tobacco, find out from your caregiver how to quit. It can literally save your life, no matter how long you have been a tobacco user. If you do not use tobacco, never begin.   Maintain a healthy diet and normal weight. Increased weight leads to problems  with blood pressure and diabetes.   The Body Mass Index or BMI is a way of measuring how much of your body is fat. Having a BMI above 27 increases the risk of heart disease, diabetes, hypertension, stroke and other problems related to obesity. Your caregiver can help determine your BMI and based on it develop an exercise and dietary program to help you achieve or maintain this important measurement at a healthful  level.  High blood pressure causes heart and blood vessel problems.  Persistent high blood pressure should be treated with medicine if weight loss and exercise do not work.   Fat and cholesterol leaves deposits in your arteries that can block them. This causes heart disease and vessel disease elsewhere in your body.  If your cholesterol is found to be high, or if you have heart disease or certain other medical conditions, then you may need to have your cholesterol monitored frequently and be treated with medication.   Ask if you should have a cardiac stress test if your history suggests this. A stress test is a test done on a treadmill that looks for heart disease. This test can find disease prior to there being a problem.  Osteoporosis is a disease in which the bones lose minerals and strength as we age. This can result in serious bone fractures. Risk of osteoporosis can be identified using a bone density scan. Men ages 23 and over should discuss this with their caregivers. Ask your caregiver whether you should be taking a calcium supplement and Vitamin D, to reduce the rate of osteoporosis.   Avoid drinking alcohol in excess (more than two drinks per day).  Avoid use of street drugs. Do not share needles with anyone. Ask for professional help if you need assistance or instructions on stopping the use of alcohol, cigarettes, and/or drugs.  Brush your teeth twice a day with fluoride toothpaste, and floss once a day. Good oral hygiene prevents tooth decay and gum disease. The problems can be painful, unattractive, and can cause other health problems. Visit your dentist for a routine oral and dental check up and preventive care every 6-12 months.   Look at your skin regularly.  Use a mirror to look at your back. Notify your caregivers of changes in moles, especially if there are changes in shapes, colors, a size larger than a pencil eraser, an irregular border, or development of new  moles.  Safety:  Use seatbelts 100% of the time, whether driving or as a passenger.  Use safety devices such as hearing protection if you work in environments with loud noise or significant background noise.  Use safety glasses when doing any work that could send debris in to the eyes.  Use a helmet if you ride a bike or motorcycle.  Use appropriate safety gear for contact sports.  Talk to your caregiver about gun safety.  Use sunscreen with a SPF (or skin protection factor) of 15 or greater.  Lighter skinned people are at a greater risk of skin cancer. Don't forget to also wear sunglasses in order to protect your eyes from too much damaging sunlight. Damaging sunlight can accelerate cataract formation.   Practice safe sex. Use condoms. Condoms are used for birth control and to help reduce the spread of sexually transmitted infections (or STIs).  Some of the STIs are gonorrhea (the clap), chlamydia, syphilis, trichomonas, herpes, HPV (human papilloma virus) and HIV (human immunodeficiency virus) which causes AIDS. The herpes, HIV and HPV are viral illnesses  that have no cure. These can result in disability, cancer and death.   Keep carbon monoxide and smoke detectors in your home functioning at all times. Change the batteries every 6 months or use a model that plugs into the wall.   Vaccinations:  Stay up to date with your tetanus shots and other required immunizations. You should have a booster for tetanus every 10 years. Be sure to get your flu shot every year, since 5%-20% of the [place] population comes down with the flu. The flu vaccine changes each year, so being vaccinated once is not enough. Get your shot in the fall, before the flu season peaks.   Other vaccines to consider:  Human Papilloma Virus or HPV causes cancer of the cervix, and other infections that can be transmitted from person to person. There is a vaccine for HPV, and males should get immunized between the ages of 28 and 55. It  requires a series of 3 shots.   Pneumococcal vaccine to protect against certain types of pneumonia.  This is normally recommended for adults age 25 or older.  However, adults younger than 41 years old with certain underlying conditions such as diabetes, heart or lung disease should also receive the vaccine.  Shingles vaccine to protect against Varicella Zoster if you are older than age 41, or younger than 41 years old with certain underlying illness.  If you have not had the Shingrix vaccine, please call your insurer to inquire about coverage for the Shingrix vaccine given in 2 doses.   Some insurers cover this vaccine after age 62, some cover this after age 81.  If your insurer covers this, then call to schedule appointment to have this vaccine here  Hepatitis A vaccine to protect against a form of infection of the liver by a virus acquired from food.  Hepatitis B vaccine to protect against a form of infection of the liver by a virus acquired from blood or body fluids, particularly if you work in health care.  If you plan to travel internationally, check with your local health department for specific vaccination recommendations.   What should I know about cancer screening? Many types of cancers can be detected early and may often be prevented. Lung Cancer  You should be screened every year for lung cancer if: ? You are a current smoker who has smoked for at least 30 years. ? You are a former smoker who has quit within the past 15 years.  Talk to your health care provider about your screening options, when you should start screening, and how often you should be screened.  Colorectal Cancer  Routine colorectal cancer screening usually begins at 41 years of age and should be repeated every 5-10 years until you are 41 years old. You may need to be screened more often if early forms of precancerous polyps or small growths are found. Your health care provider may recommend screening at an earlier  age if you have risk factors for colon cancer.  Your health care provider may recommend using home test kits to check for hidden blood in the stool.  A small camera at the end of a tube can be used to examine your colon (sigmoidoscopy or colonoscopy). This checks for the earliest forms of colorectal cancer.  Prostate and Testicular Cancer  Depending on your age and overall health, your health care provider may do certain tests to screen for prostate and testicular cancer.  Talk to your health care provider about any symptoms  or concerns you have about testicular or prostate cancer.  Skin Cancer  Check your skin from head to toe regularly.  Tell your health care provider about any new moles or changes in moles, especially if: ? There is a change in a mole's size, shape, or color. ? You have a mole that is larger than a pencil eraser.  Always use sunscreen. Apply sunscreen liberally and repeat throughout the day.  Protect yourself by wearing long sleeves, pants, a wide-brimmed hat, and sunglasses when outside.

## 2017-07-10 NOTE — Progress Notes (Signed)
Subjective:   HPI  John Robbins is a 42 y.o. male who presents for Chief Complaint  Patient presents with  . Annual Exam    wart on finger to be looked at     Medical team Infectious Disease - Dr. Scharlene Gloss Psychiatry - [facility], Dr. Yehuda Budd, [phone] Sees eye doctor and dentist GI - Dr. Owens Loffler [name], Camelia Eng, PA-C here for primary care  Concerns: Has c/o wart of right index finger for years.   Has tried OTC medication without relief.  It helps for a time but doesn't go completely away.    Sees counselor weekly, psychiatrist regularly.   He has had recent psychiatric hospitalization.    Reviewed their medical, surgical, family, social, medication, and allergy history and updated chart as appropriate.  Past Medical History:  Diagnosis Date  . Allergy   . Anxiety   . Celiac disease    Dr. Owens Loffler  . History of EKG 01/2017  . History of MRI of brain and brain stem 01/2017  . History of substance abuse   . HIV infection ([place])   . Raynaud disease   . Seizures ([place])     Past Surgical History:  Procedure Laterality Date  . COLONOSCOPY WITH ESOPHAGOGASTRODUODENOSCOPY (EGD)  11/2016   repeat colonoscopy 5 years.  Dr. Owens Loffler  . WISDOM TOOTH EXTRACTION      Social History   Socioeconomic History  . Marital status: Unknown    Spouse name: Not on file  . Number of children: Not on file  . Years of education: Not on file  . Highest education level: Not on file  Occupational History  . Not on file  Social Needs  . Financial resource strain: Not on file  . Food insecurity:    Worry: Not on file    Inability: Not on file  . Transportation needs:    Medical: Not on file    Non-medical: Not on file  Tobacco Use  . Smoking status: Never Smoker  . Smokeless tobacco: Never Used  Substance and Sexual Activity  . Alcohol use: No    Alcohol/week: 1.2 oz    Types: 2 Glasses of wine per week    Frequency: Never   Comment: occ  . Drug use: Yes    Types: Methamphetamines    Comment: Hx of maijuana, ecstacy, GHB, ketamine, amephetamines, cocaine - nothing since 2009  . Sexual activity: Yes    Partners: Male    Comment: Uses Condoms  Lifestyle  . Physical activity:    Days per week: Not on file    Minutes per session: Not on file  . Stress: Not on file  Relationships  . Social connections:    Talks on phone: Not on file    Gets together: Not on file    Attends religious service: Not on file    Active member of club or organization: Not on file    Attends meetings of clubs or organizations: Not on file    Relationship status: Not on file  . Intimate partner violence:    Fear of current or ex partner: Not on file    Emotionally abused: Not on file    Physically abused: Not on file    Forced sexual activity: Not on file  Other Topics Concern  . Not on file  Social History Narrative   Works in Engineer, technical sales.  Exercise - yoga, weights and aerobic, 3 times per week at gym.  07/2017    Family History  Problem Relation Age of Onset  . Heart disease Father 77  . Hypertension Father   . Heart disease Maternal Grandfather        early 57s  . Hypertension Maternal Grandfather   . Hypertension Mother   . Rheum arthritis Mother   . Crohn's disease Mother   . Crohn's disease Sister   . Rheum arthritis Maternal Grandmother   . Breast cancer Maternal Grandmother   . Colon cancer Paternal Grandmother   . Hypertension Paternal Grandfather   . Esophageal cancer Neg Hx   . Stomach cancer Neg Hx      Current Outpatient Medications:  .  abacavir-dolutegravir-lamiVUDine (TRIUMEQ) 600-50-300 MG tablet, TAKE 1 TABLET BY MOUTH ONCE EVERY DAY: For HIV infection, Disp: 1 tablet, Rfl: 0 .  fluticasone (FLONASE) 50 MCG/ACT nasal spray, Place 1 spray into both nostrils 2 (two) times daily. For nasal congestion, Disp: , Rfl: 2 .  gabapentin (NEURONTIN) 300 MG capsule, Take 1 capsule (300 mg total) by mouth 3 (three)  times daily. For agitation, Disp: 270 capsule, Rfl: 0 .  hydrOXYzine (ATARAX/VISTARIL) 50 MG tablet, Take 1 tablet (50 mg total) by mouth every 6 (six) hours as needed for anxiety., Disp: 360 tablet, Rfl: 0 .  lamoTRIgine (LAMICTAL) 25 MG tablet, Take 50 mg by mouth daily., Disp: , Rfl: 0 .  risperiDONE (RISPERDAL) 2 MG tablet, Take 1 tablet (2 mg total) by mouth at bedtime. For mood control, Disp: 90 tablet, Rfl: 0 .  traZODone (DESYREL) 150 MG tablet, Take 1 tablet (150 mg total) by mouth at bedtime. For sleep, Disp: 90 tablet, Rfl: 0 .  TRIUMEQ 600-50-300 MG tablet, TAKE 1 TABLET BY MOUTH ONCE EVERY DAY, Disp: 90 tablet, Rfl: 2 .  valACYclovir (VALTREX) 1000 MG tablet, Take 1 tablet (1,000 mg total) by mouth daily., Disp: 30 tablet, Rfl: 11 .  valACYclovir (VALTREX) 1000 MG tablet, Take 1 tablet (1,000 mg total) by mouth 2 (two) times daily., Disp: 10 tablet, Rfl: 5 .  amoxicillin-clavulanate (AUGMENTIN) 875-125 MG tablet, Take 1 tablet by mouth every 12 (twelve) hours. For Sinusitis (Patient not taking: Reported on 07/03/2017), Disp: , Rfl:   Allergies  Allergen Reactions  . Abilify [Aripiprazole] Other (See Comments)    Akathesia  . Gluten Meal Other (See Comments)    Neurological       Review of Systems Constitutional: -fever, -chills, -sweats, -unexpected weight change, -decreased appetite, -fatigue Allergy: -sneezing, -itching, -congestion Dermatology: -changing moles, --rash, -lumps ENT: -runny nose, -ear pain, -sore throat, -hoarseness, -sinus pain, -teeth pain, - ringing in ears, -hearing loss, -nosebleeds Cardiology: -chest pain, -palpitations, -swelling, -difficulty breathing when lying flat, -waking up short of breath Respiratory: -cough, -shortness of breath, -difficulty breathing with exercise or exertion, -wheezing, -coughing up blood Gastroenterology: -abdominal pain, -nausea, -vomiting, -diarrhea, -constipation, -blood in stool, -changes in bowel movement, -difficulty  swallowing or eating Hematology: -bleeding, -bruising  Musculoskeletal: -joint aches, -muscle aches, -joint swelling, -back pain, -neck pain, -cramping, -changes in gait Ophthalmology: denies vision changes, eye redness, itching, discharge Urology: -burning with urination, -difficulty urinating, -blood in urine, -urinary frequency, -urgency, -incontinence Neurology: -headache, -weakness, -tingling, -numbness, -memory loss, -falls, -dizziness Psychology: -depressed mood, -agitation, -sleep problems Male GU: no testicular mass, pain, no lymph nodes swollen, no swelling, no rash.     Objective:  BP 122/84 (BP Location: Right Arm, Patient Position: Sitting, Cuff Size: Normal)   Pulse (!) 48   Temp 97.9 F (36.6 C) (Oral)  Ht 6' (1.829 m)   Wt 166 lb (75.3 kg)   SpO2 98%   BMI 22.51 kg/m   General appearance: alert, no distress, WD/WN, Caucasian male Skin: right 2nd finger lateral PIP with 70m diameter raised verruca lesion, scattered macules of back, no other worrisome lesions HEENT: normocephalic, conjunctiva/corneas normal, sclerae anicteric, PERRLA, EOMi, nares patent, no discharge or erythema, pharynx normal Oral cavity: MMM, tongue normal, teeth normal Neck: supple, no lymphadenopathy, no thyromegaly, no masses, normal ROM, no bruits Chest: non tender, normal shape and expansion Heart: RRR, normal S1, S2, no murmurs Lungs: CTA bilaterally, no wheezes, rhonchi, or rales Abdomen: +bs, soft, non tender, non distended, no masses, no hepatomegaly, no splenomegaly, no bruits Back: non tender, normal ROM, no scoliosis Musculoskeletal: upper extremities non tender, no obvious deformity, normal ROM throughout, lower extremities non tender, no obvious deformity, normal ROM throughout Extremities: no edema, no cyanosis, no clubbing Pulses: 2+ symmetric, upper and lower extremities, normal cap refill Neurological: alert, oriented x 3, CN2-12 intact, strength normal upper extremities and lower  extremities, sensation normal throughout, DTRs 2+ throughout, no cerebellar signs, gait normal Psychiatric: normal affect, behavior normal, pleasant  GU: normal male external genitalia,circumcised, nontender, no masses, no hernia, no lymphadenopathy Rectal: deferred   Assessment and Plan :   Encounter Diagnoses  Name Primary?  . Encounter for health maintenance examination in adult Yes  . Celiac disease   . Otosclerosis of left ear   . Conductive hearing loss of left ear, unspecified hearing status on contralateral side   . Attention deficit hyperactivity disorder (ADHD), unspecified ADHD type   . Anxiety and depression   . Encounter for long-term (current) use of medications   . Human immunodeficiency virus (HIV) disease ([place]   . Methamphetamine abuse ([place]   . Polyarthralgia   . Severe recurrent major depression with psychotic features ([place]   . Schizoaffective disorder, bipolar type ([place]   . Tinnitus of both ears   . Substance-induced psychotic disorder ([place]   . Screening for prostate cancer   . Vaccine counseling   . Elevated LFTs   . Viral warts, unspecified type     Physical exam - discussed and counseled on healthy lifestyle, diet, exercise, preventative care, vaccinations, sick and well care, proper use of emergency dept and after hours care, and addressed their concerns.    Health screening: See your eye doctor yearly for routine vision care. See your dentist yearly for routine dental care including hygiene visits twice yearly.  Cancer screening Advised monthly self testicular exam  Colonoscopy:  Reviewed colonoscopy on file that is up to date  Discussed PSA, prostate exam, and prostate cancer screening risks/benefits.     Vaccinations: Advised yearly influenza vaccine Possibly consider hep A/B vaccine if not vaccinated.  Infectious disease may have other records on file that we don't have.  Acute issues discussed: Elevated LFTs on recent labs in 05/2017,  additional labs today.  discussed possible causes.  Reviewed infectious disease visit notes and chart notes.     Separate significant chronic issues discussed: Wart - refer to dermatology  Narvel was seen today for annual exam.  Diagnoses and all orders for this visit:  Encounter for health maintenance examination in adult -     Hepatitis panel, acute -     AFP tumor marker -     Iron and TIBC -     PSA -     Hepatitis B surface antibody  Celiac disease  Otosclerosis of left  ear  Conductive hearing loss of left ear, unspecified hearing status on contralateral side  Attention deficit hyperactivity disorder (ADHD), unspecified ADHD type  Anxiety and depression  Encounter for long-term (current) use of medications  Human immunodeficiency virus (HIV) disease ([place])  Methamphetamine abuse ([place])  Polyarthralgia  Severe recurrent major depression with psychotic features (John Robbins)  Schizoaffective disorder, bipolar type ([place])  Tinnitus of both ears  Substance-induced psychotic disorder (John Robbins)  Screening for prostate cancer -     PSA  Vaccine counseling  Elevated LFTs -     Hepatitis panel, acute -     AFP tumor marker -     Iron and TIBC -     Hepatitis B surface antibody  Viral warts, unspecified type    Follow-up pending labs, yearly for physical

## 2017-07-11 ENCOUNTER — Other Ambulatory Visit: Payer: Self-pay | Admitting: Medical

## 2017-07-11 DIAGNOSIS — R945 Abnormal results of liver function studies: Principal | ICD-10-CM

## 2017-07-11 DIAGNOSIS — R7989 Other specified abnormal findings of blood chemistry: Secondary | ICD-10-CM

## 2017-07-11 LAB — IRON AND TIBC
IRON SATURATION: 17 % (ref 15–55)
IRON: 53 ug/dL (ref 38–169)
TIBC: 315 ug/dL (ref 250–450)
UIBC: 262 ug/dL (ref 111–343)

## 2017-07-11 LAB — HEPATITIS PANEL, ACUTE
HEP A IGM: NEGATIVE
Hep B C IgM: NEGATIVE
Hepatitis B Surface Ag: NEGATIVE

## 2017-07-11 LAB — PSA: Prostate Specific Ag, Serum: 0.6 ng/mL (ref 0.0–4.0)

## 2017-07-11 LAB — AFP TUMOR MARKER: AFP, SERUM, TUMOR MARKER: 2.7 ng/mL (ref 0.0–8.3)

## 2017-07-11 LAB — HEPATITIS B SURFACE ANTIBODY, QUANTITATIVE: Hepatitis B Surf Ab Quant: >1000 m[IU]/mL (ref >9.9)

## 2017-07-14 DIAGNOSIS — B078 Other viral warts: Secondary | ICD-10-CM | POA: Diagnosis not present

## 2017-07-19 ENCOUNTER — Ambulatory Visit
Admission: RE | Admit: 2017-07-19 | Discharge: 2017-07-19 | Disposition: A | Discharge status: Home / Self Care | Payer: 59 | Source: Ambulatory Visit | Attending: Medical | Admitting: Medical

## 2017-07-19 DIAGNOSIS — R945 Abnormal results of liver function studies: Secondary | ICD-10-CM | POA: Diagnosis not present

## 2017-07-19 DIAGNOSIS — R7989 Other specified abnormal findings of blood chemistry: Secondary | ICD-10-CM

## 2017-07-20 ENCOUNTER — Telehealth: Payer: Self-pay

## 2017-07-20 ENCOUNTER — Other Ambulatory Visit: Payer: Self-pay

## 2017-07-20 DIAGNOSIS — R945 Abnormal results of liver function studies: Principal | ICD-10-CM

## 2017-07-20 DIAGNOSIS — R7989 Other specified abnormal findings of blood chemistry: Secondary | ICD-10-CM

## 2017-07-20 NOTE — Telephone Encounter (Signed)
-----   Message from Carlena Hurl, PA-C sent at 07/19/2017  4:55 PM EDT ----- Ultrasound normal of liver.  At this point, lets recheck hepatic function liver panel blood test in 2-3 weeks to see if improving.   If not improving, then we either have to consider medications as a cause of the elevated LFTs or refer to gastroenterology for other eval.  The good news is the other recent labs didn't show specific cause such as infection.

## 2017-07-20 NOTE — Telephone Encounter (Signed)
Called patient, LVM to call back to discuss lab work. Left call back number.

## 2017-07-29 IMAGING — RF DG ESOPHAGUS
9 series · 19 of 24 positions shown · non-contrast
Comparison: None.

CLINICAL DATA: Dysphagia, cough, food sticking in throat

EXAM:
ESOPHOGRAM / BARIUM SWALLOW / BARIUM TABLET STUDY
TECHNIQUE: Combined double contrast and single contrast examination performed
using effervescent crystals, thick barium liquid, and thin barium
liquid. The patient was observed with fluoroscopy swallowing a 13 mm
barium sulphate tablet.
FLUOROSCOPY TIME:  Fluoroscopy Time:  2 minutes
Radiation Exposure Index (if provided by the fluoroscopic device):
7.1 mGy
Number of Acquired Spot Images: 10 (screen captures only)

[Series 1: cp_standard · 0.52mm/px · 2 of 62 frames shown (1 of 9)]
[frame 10/62]
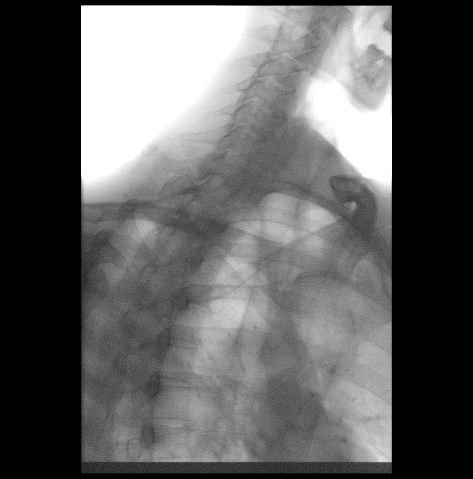
[frame 32/62]
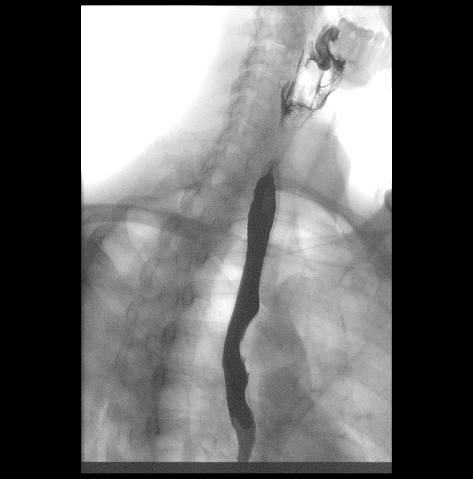

[Series 2: cp_standard · 0.34mm/px · 3 of 40 frames shown (2 of 9)]
[frame 1/40]
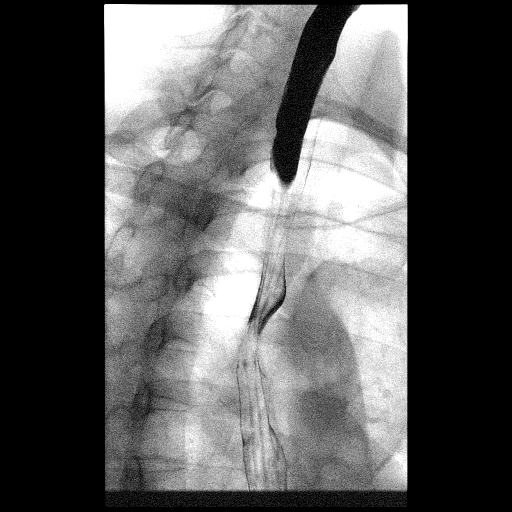
[frame 21/40]
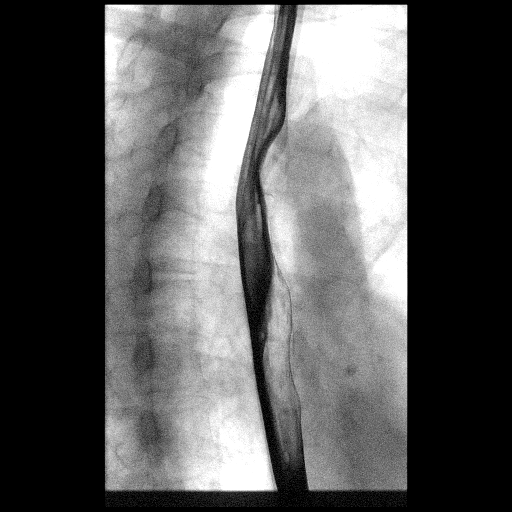
[frame 35/40]
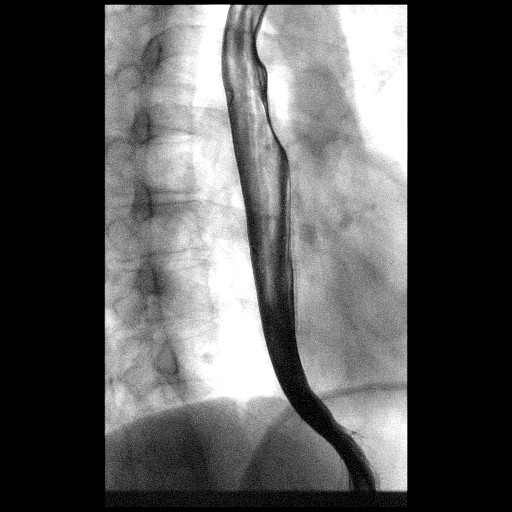

[Series 4: cp_standard · 0.34mm/px · 2 of 41 frames shown (3 of 9)]
[frame 7/41]
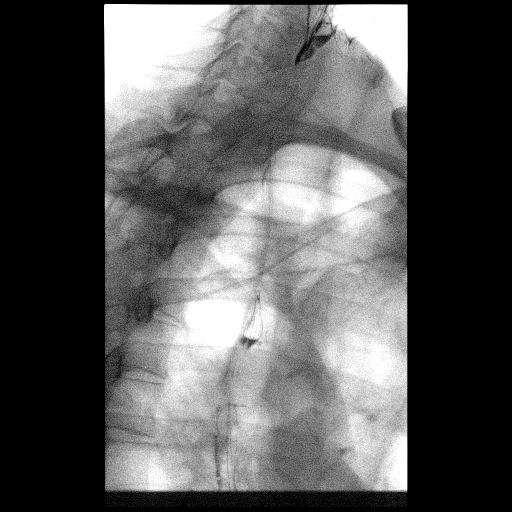
[frame 23/41]
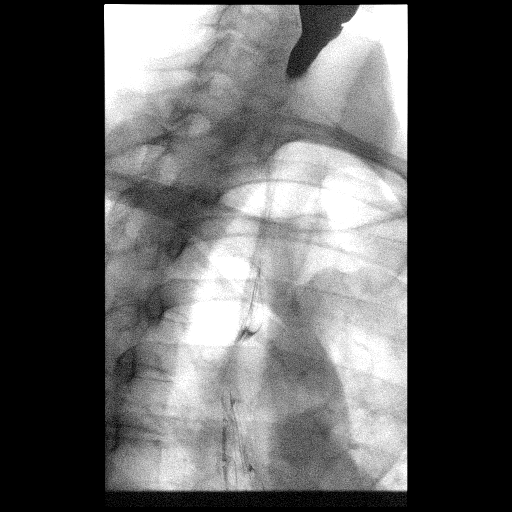

[Series 5: cp_standard · 0.34mm/px · 2 of 47 frames shown (4 of 9)]
[frame 8/47]
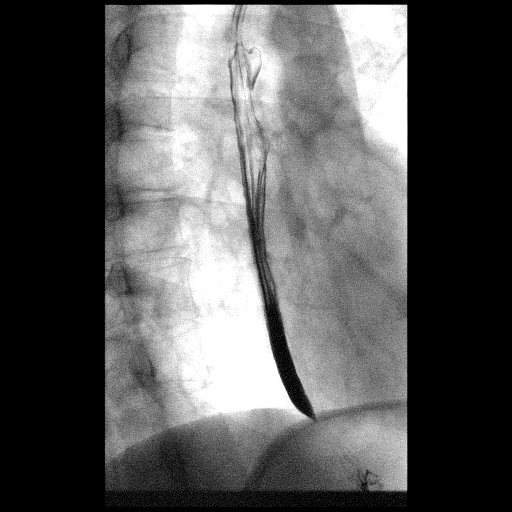
[frame 14/47]
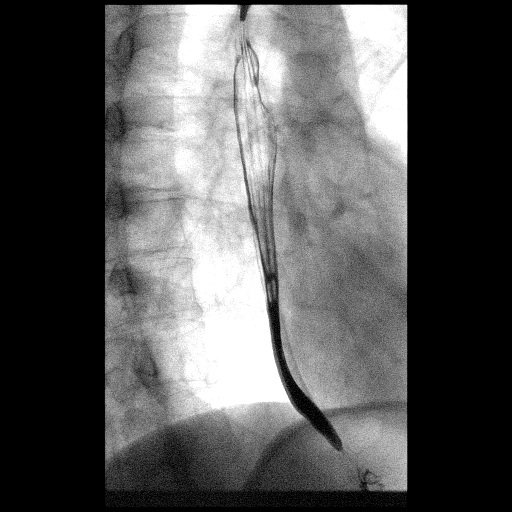

[Series 6: cp_standard · 0.17mm/px · 1 of 1 slices shown (5 of 9)]
[im 1/1]
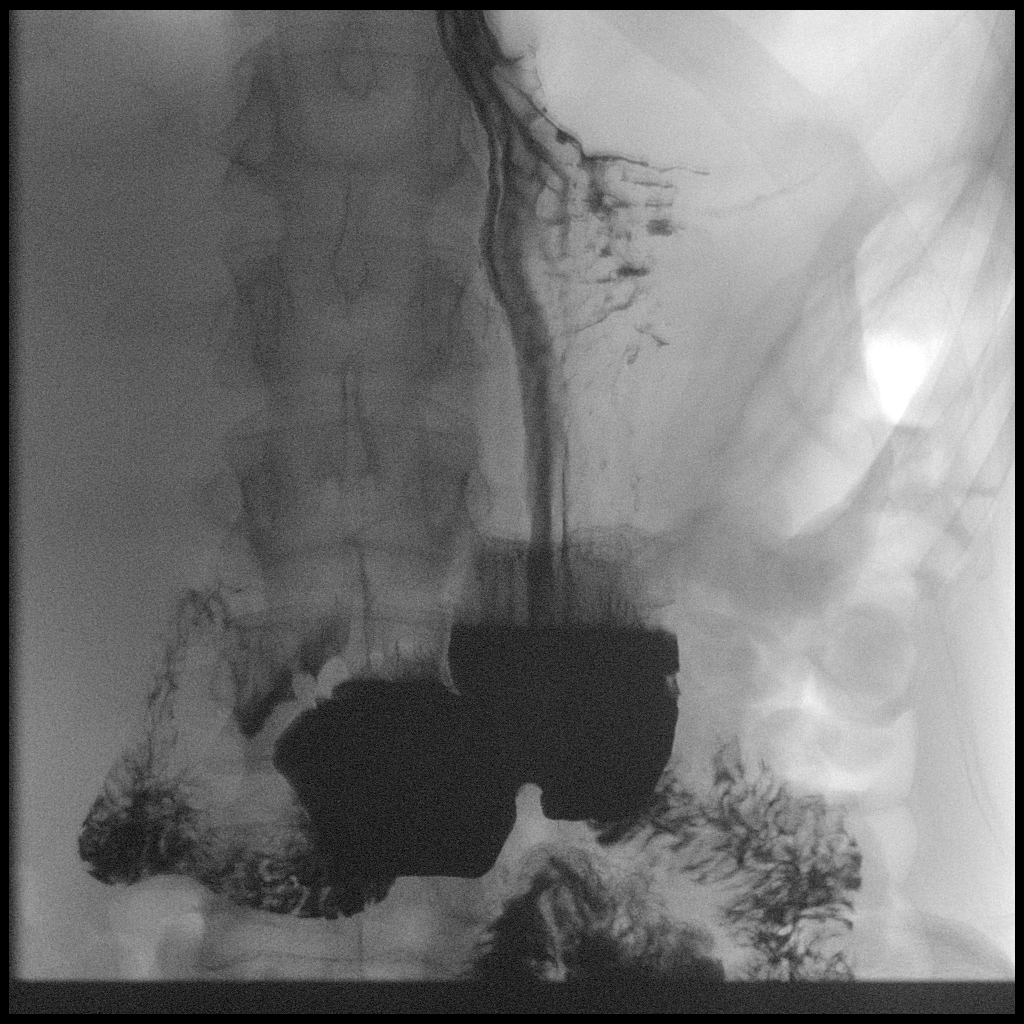

[Series 7: cp_standard · 0.34mm/px · 2 of 35 frames shown (6 of 9)]
[frame 18/35]
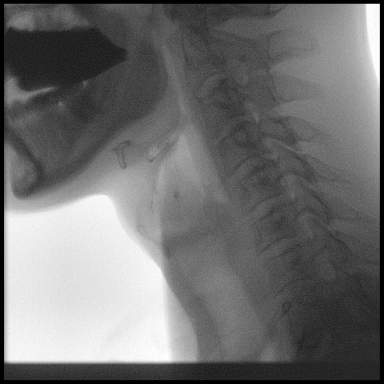
[frame 23/35]
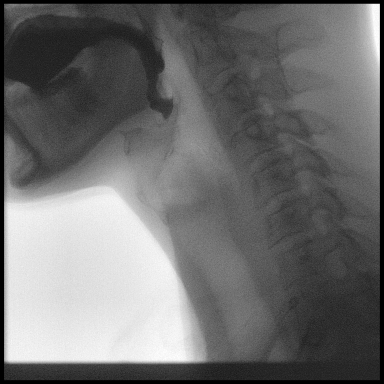

[Series 8: cp_standard · 0.34mm/px · 2 of 20 frames shown (7 of 9)]
[frame 1/20]
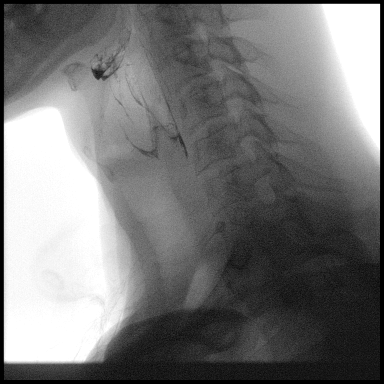
[frame 11/20]
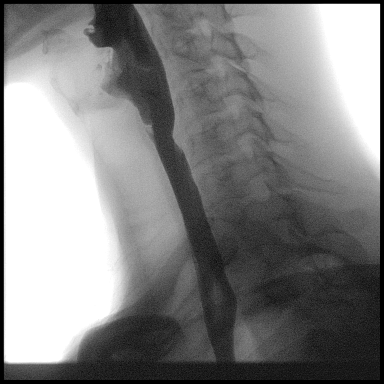

[Series 9: cp_standard · 0.51mm/px · 3 of 38 frames shown (8 of 9)]
[frame 6/38]
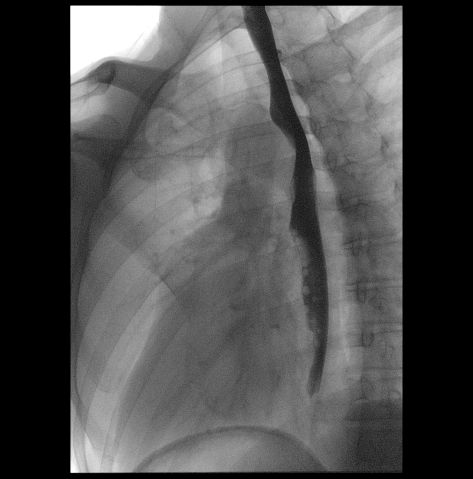
[frame 20/38]
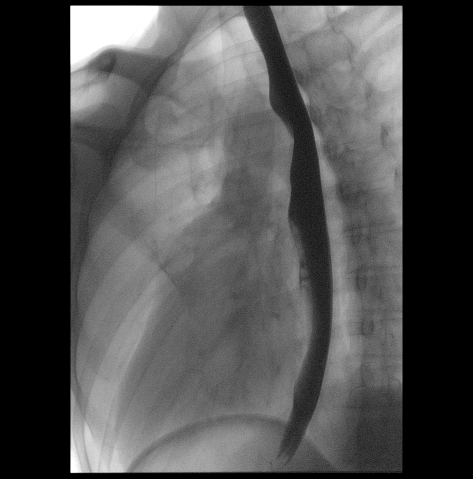
[frame 34/38]
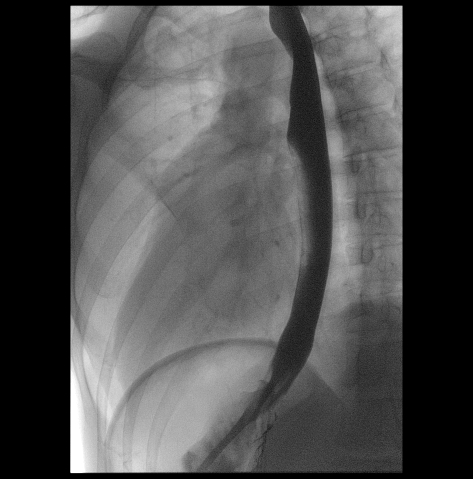

[Series 10: cp_standard · 0.51mm/px · 2 of 20 frames shown (9 of 9)]
[frame 13/20]
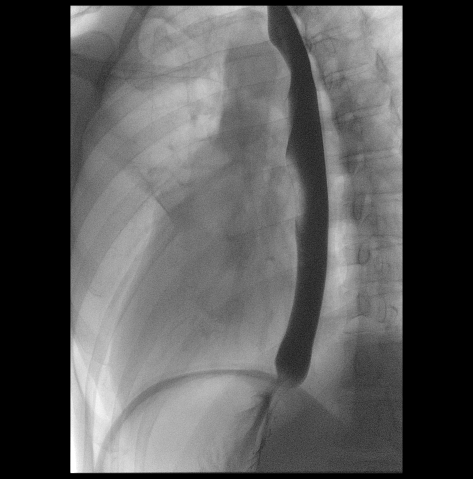
[frame 18/20]
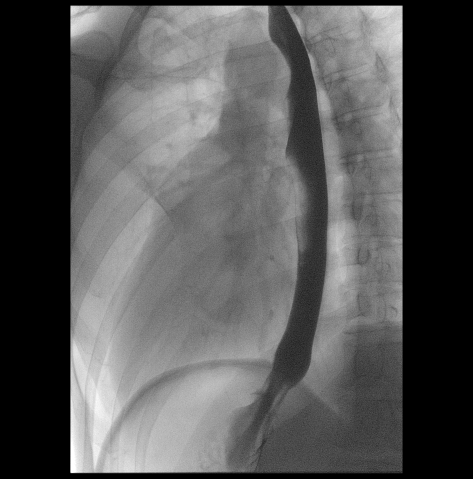

[19 of 24 positions shown; findings below may reference images not displayed]

FINDINGS: No evidence of laryngeal penetration or tracheobronchial aspiration.

No evidence of esophageal narrowing or stricture. The 13 mm barium
tablet was transiently delayed in the oropharynx but later passed
probably into the stomach.

Normal esophageal peristalsis.

Unremarkable gastric folds.

No gastroesophageal reflux was demonstrated.
IMPRESSION: Negative double contrast esophagram.

## 2017-08-08 ENCOUNTER — Other Ambulatory Visit: Payer: 59

## 2017-08-08 DIAGNOSIS — R945 Abnormal results of liver function studies: Secondary | ICD-10-CM | POA: Diagnosis not present

## 2017-08-08 DIAGNOSIS — R7989 Other specified abnormal findings of blood chemistry: Secondary | ICD-10-CM

## 2017-08-09 ENCOUNTER — Other Ambulatory Visit: Payer: Self-pay | Admitting: Medical

## 2017-08-09 DIAGNOSIS — R7989 Other specified abnormal findings of blood chemistry: Secondary | ICD-10-CM

## 2017-08-09 DIAGNOSIS — R945 Abnormal results of liver function studies: Principal | ICD-10-CM

## 2017-08-09 LAB — HEPATIC FUNCTION PANEL
ALK PHOS: 59 IU/L (ref 39–117)
ALT: 23 IU/L (ref 0–44)
AST: 17 IU/L (ref 0–40)
Albumin: 4.7 g/dL (ref 3.5–5.5)
BILIRUBIN, DIRECT: 0.12 mg/dL (ref 0.00–0.40)
Bilirubin Total: 0.4 mg/dL (ref 0.0–1.2)
TOTAL PROTEIN: 6.6 g/dL (ref 6.0–8.5)

## 2017-10-20 DIAGNOSIS — Z79899 Other long term (current) drug therapy: Secondary | ICD-10-CM | POA: Diagnosis not present

## 2017-11-09 DIAGNOSIS — Z79899 Other long term (current) drug therapy: Secondary | ICD-10-CM | POA: Diagnosis not present

## 2017-11-10 ENCOUNTER — Other Ambulatory Visit: Payer: 59

## 2017-11-10 DIAGNOSIS — R945 Abnormal results of liver function studies: Principal | ICD-10-CM

## 2017-11-10 DIAGNOSIS — R7989 Other specified abnormal findings of blood chemistry: Secondary | ICD-10-CM

## 2017-11-11 LAB — HEPATIC FUNCTION PANEL
ALBUMIN: 4.9 g/dL (ref 3.5–5.5)
ALK PHOS: 52 IU/L (ref 39–117)
ALT: 33 IU/L (ref 0–44)
AST: 24 IU/L (ref 0–40)
BILIRUBIN TOTAL: 0.6 mg/dL (ref 0.0–1.2)
BILIRUBIN, DIRECT: 0.14 mg/dL (ref 0.00–0.40)
TOTAL PROTEIN: 6.9 g/dL (ref 6.0–8.5)

## 2017-12-20 ENCOUNTER — Other Ambulatory Visit: Payer: Self-pay

## 2018-01-03 ENCOUNTER — Encounter: Payer: Self-pay | Admitting: Internal Medicine

## 2018-01-10 DIAGNOSIS — R5383 Other fatigue: Secondary | ICD-10-CM | POA: Diagnosis not present

## 2018-01-10 DIAGNOSIS — R251 Tremor, unspecified: Secondary | ICD-10-CM | POA: Diagnosis not present

## 2018-03-27 ENCOUNTER — Ambulatory Visit: Payer: 59 | Admitting: Medical

## 2018-03-29 ENCOUNTER — Ambulatory Visit: Payer: 59 | Admitting: Medical

## 2018-03-29 ENCOUNTER — Encounter: Payer: Self-pay | Admitting: Medical

## 2018-03-29 VITALS — BP 120/82 | HR 69 | Temp 97.8°F | Resp 16 | Ht 73.0 in | Wt 168.6 lb

## 2018-03-29 DIAGNOSIS — F4310 Post-traumatic stress disorder, unspecified: Secondary | ICD-10-CM

## 2018-03-29 DIAGNOSIS — F1520 Other stimulant dependence, uncomplicated: Secondary | ICD-10-CM | POA: Diagnosis not present

## 2018-03-29 DIAGNOSIS — R945 Abnormal results of liver function studies: Secondary | ICD-10-CM | POA: Diagnosis not present

## 2018-03-29 DIAGNOSIS — F419 Anxiety disorder, unspecified: Secondary | ICD-10-CM | POA: Diagnosis not present

## 2018-03-29 DIAGNOSIS — F32A Depression, unspecified: Secondary | ICD-10-CM

## 2018-03-29 DIAGNOSIS — F909 Attention-deficit hyperactivity disorder, unspecified type: Secondary | ICD-10-CM | POA: Diagnosis not present

## 2018-03-29 DIAGNOSIS — R5383 Other fatigue: Secondary | ICD-10-CM

## 2018-03-29 DIAGNOSIS — F329 Major depressive disorder, single episode, unspecified: Secondary | ICD-10-CM

## 2018-03-29 DIAGNOSIS — F19959 Other psychoactive substance use, unspecified with psychoactive substance-induced psychotic disorder, unspecified: Secondary | ICD-10-CM

## 2018-03-29 DIAGNOSIS — B20 Human immunodeficiency virus [HIV] disease: Secondary | ICD-10-CM

## 2018-03-29 DIAGNOSIS — R7989 Other specified abnormal findings of blood chemistry: Secondary | ICD-10-CM

## 2018-03-29 NOTE — Progress Notes (Signed)
Subjective: Chief Complaint  Patient presents with  . follow up liver    follow up liver enzymes    Medical team: Psychiatry, Dr. Barbie Banner, treatment facility in [facility], [place] Infection Disease, Dr. Scharlene Gloss Laneah Luft, Camelia Eng, PA-C here for primary care  Here for follow-up on elevated liver enzymes.  He reports that he was in a [facility] a month ago.  Was a 30 day program.    Battling lethargy currently.    He had some abnormal lab results during the program.    I saw him last 07/2017 for physical and elevated LFTs.    Labs from August 08, 2017 for hepatic panel shows normalization of liver tests compared to February 2019. Other labs from July 10, 2017 include negative hepatitis A, B, C, normal AFP tumor marker, iron and TIBC normal  Not currently drinking any alcohol, denies any other current substance abuse.   Living some with mom, but in general lives alone.    He recalls liver tests were initially 3 x normal, but upon recheck there was improved but not normal.   Needs this rechecked.   Gets bouts of extreme lethargy on and off the last 2 years.   He notes hx/o seizures in childhood, no recent neurology eval.    During treatment he would go to sessions and counseling but would go right back to bed.   Was having trouble staying awake.  Was also being tried on new medications while there.   Was on Depakote but this was changed with the elevated LFTs.     Was abusing methamphetamines prior to the treatment facility.   Appetite is increased.  No hx/o thyroid disease in self or family.  No prior sleep study.  Past Medical History:  Diagnosis Date  . Allergy   . Anxiety   . Celiac disease    Dr. Owens Loffler  . History of EKG 01/2017  . History of MRI of brain and brain stem 01/2017  . History of substance abuse ([place])   . HIV infection ([place])   . Raynaud disease   . Seizures ([place])    Current Outpatient Medications on File Prior to Visit  Medication  Sig Dispense Refill  . abacavir-dolutegravir-lamiVUDine (TRIUMEQ) 600-50-300 MG tablet TAKE 1 TABLET BY MOUTH ONCE EVERY DAY: For HIV infection 1 tablet 0  . fluticasone (FLONASE) 50 MCG/ACT nasal spray Place 1 spray into both nostrils 2 (two) times daily. For nasal congestion  2  . lamoTRIgine (LAMICTAL) 25 MG tablet Take 50 mg by mouth daily.  0  . OLANZapine (ZYPREXA) 15 MG tablet Take 15 mg by mouth at bedtime.    . valACYclovir (VALTREX) 1000 MG tablet Take 1 tablet (1,000 mg total) by mouth daily. 30 tablet 11   No current facility-administered medications on file prior to visit.    Family History  Problem Relation Age of Onset  . Heart disease Father 43  . Hypertension Father   . Heart disease Maternal Grandfather        early 69s  . Hypertension Maternal Grandfather   . Hypertension Mother   . Rheum arthritis Mother   . Crohn's disease Mother   . Crohn's disease Sister   . Rheum arthritis Maternal Grandmother   . Breast cancer Maternal Grandmother   . Colon cancer Paternal Grandmother   . Hypertension Paternal Grandfather   . Esophageal cancer Neg Hx   . Stomach cancer Neg Hx    ROS as in  subjective   Objective: BP 120/82   Pulse 69   Temp 97.8 F (36.6 C) (Oral)   Resp 16   Ht 6' 1"  (1.854 m)   Wt 168 lb 9.6 oz (76.5 kg)   SpO2 96%   BMI 22.24 kg/m     General appearance: alert, no distress, WD/WN,  HEENT: normocephalic, sclerae anicteric, PERRLA, EOMi, nares patent, no discharge or erythema, pharynx normal Oral cavity: MMM, no lesions Neck: supple, no lymphadenopathy, no thyromegaly, no masses Heart: RRR, normal S1, S2, no murmurs Lungs: CTA bilaterally, no wheezes, rhonchi, or rales Abdomen: +bs, soft, non tender, non distended, no masses, no hepatomegaly, no splenomegaly Extremities: no edema, no cyanosis, no clubbing Pulses: 2+ symmetric, upper and lower extremities, normal cap refill Neurological: alert, oriented x 3, CN2-12 intact, strength normal  upper extremities and lower extremities, sensation normal throughout, DTRs 2+ throughout, no cerebellar signs, gait normal Psychiatric: normal affect, behavior normal, pleasant     Assessment: Encounter Diagnoses  Name Primary?  . Attention deficit hyperactivity disorder (ADHD), unspecified ADHD type Yes  . Amphetamine use disorder, severe ([place])   . Anxiety and depression   . Elevated LFTs   . Other fatigue   . Human immunodeficiency virus (HIV) disease ([place])   . PTSD (post-traumatic stress disorder)   . Substance-induced psychotic disorder ([place])     Plan: We discussed his concerns and symptoms.  He has a history of amphetamine abuse, just finished a 30-day treatment facility at [facility].  We will request recent lab records.  Labs today to recheck liver and other labs to eval for lethargy.  Will request labs from [facility].   will likely refer to neurology     Cosmo was seen today for follow up liver.  Diagnoses and all orders for this visit:  Attention deficit hyperactivity disorder (ADHD), unspecified ADHD type  Amphetamine use disorder, severe (HCC)  Anxiety and depression  Elevated LFTs -     CBC with Differential/Platelet -     TSH -     T4, free -     Hepatic function panel -     Basic metabolic panel  Other fatigue -     CBC with Differential/Platelet -     TSH -     T4, free  Human immunodeficiency virus (HIV) disease (H[facility])  PTSD (post-traumatic stress disorder)  Substance-induced psychotic disorder (H[facility]) -     CBC with Differential/Platelet -     TSH -     T4, free -     Hepatic function panel -     Basic metabolic panel

## 2018-03-30 ENCOUNTER — Other Ambulatory Visit: Payer: Self-pay | Admitting: Medical

## 2018-03-30 ENCOUNTER — Other Ambulatory Visit: Payer: Self-pay

## 2018-03-30 DIAGNOSIS — R5383 Other fatigue: Secondary | ICD-10-CM

## 2018-03-30 DIAGNOSIS — Z87898 Personal history of other specified conditions: Secondary | ICD-10-CM

## 2018-03-30 LAB — CBC WITH DIFFERENTIAL/PLATELET
Basophils Absolute: 0 10*3/uL (ref 0.0–0.2)
Basos: 1 %
EOS (ABSOLUTE): 0.1 10*3/uL (ref 0.0–0.4)
EOS: 2 %
HEMATOCRIT: 49.4 % (ref 37.5–51.0)
Hemoglobin: 16.7 g/dL (ref 13.0–17.7)
Immature Grans (Abs): 0 10*3/uL (ref 0.0–0.1)
Immature Granulocytes: 0 %
LYMPHS ABS: 2.3 10*3/uL (ref 0.7–3.1)
Lymphs: 42 %
MCH: 29.7 pg (ref 26.6–33.0)
MCHC: 33.8 g/dL (ref 31.5–35.7)
MCV: 88 fL (ref 79–97)
MONOS ABS: 0.5 10*3/uL (ref 0.1–0.9)
Monocytes: 9 %
Neutrophils Absolute: 2.5 10*3/uL (ref 1.4–7.0)
Neutrophils: 46 %
Platelets: 224 10*3/uL (ref 150–450)
RBC: 5.62 x10E6/uL (ref 4.14–5.80)
RDW: 12.9 % (ref 12.3–15.4)
WBC: 5.4 10*3/uL (ref 3.4–10.8)

## 2018-03-30 LAB — HEPATIC FUNCTION PANEL
ALK PHOS: 72 IU/L (ref 39–117)
ALT: 51 IU/L — AB (ref 0–44)
AST: 25 IU/L (ref 0–40)
Albumin: 4.2 g/dL (ref 3.5–5.5)
BILIRUBIN, DIRECT: 0.1 mg/dL (ref 0.00–0.40)
Bilirubin Total: 0.3 mg/dL (ref 0.0–1.2)
Total Protein: 6.5 g/dL (ref 6.0–8.5)

## 2018-03-30 LAB — BASIC METABOLIC PANEL
BUN / CREAT RATIO: 13 (ref 9–20)
BUN: 12 mg/dL (ref 6–24)
CHLORIDE: 100 mmol/L (ref 96–106)
CO2: 24 mmol/L (ref 20–29)
Calcium: 9.1 mg/dL (ref 8.7–10.2)
Creatinine, Ser: 0.89 mg/dL (ref 0.76–1.27)
GFR calc Af Amer: 123 mL/min/{1.73_m2} (ref >59)
GFR calc non Af Amer: 106 mL/min/{1.73_m2} (ref >59)
GLUCOSE: 92 mg/dL (ref 65–99)
POTASSIUM: 4.4 mmol/L (ref 3.5–5.2)
Sodium: 141 mmol/L (ref 134–144)

## 2018-03-30 LAB — T4, FREE: FREE T4: 1.23 ng/dL (ref 0.82–1.77)

## 2018-03-30 LAB — TSH: TSH: 0.791 u[IU]/mL (ref 0.450–4.500)

## 2018-05-23 ENCOUNTER — Other Ambulatory Visit: Payer: Self-pay | Admitting: Internal Medicine

## 2021-12-15 ENCOUNTER — Encounter: Payer: Self-pay | Admitting: Internal Medicine

## 2022-01-18 ENCOUNTER — Encounter: Payer: Self-pay | Admitting: Internal Medicine
